# Patient Record
Sex: Female | Born: 1970
Health system: Southern US, Community
[De-identification: ages and names within clinical notes are randomized; demographics above are authoritative.]

## PROBLEM LIST (undated history)

## (undated) DIAGNOSIS — K219 Gastro-esophageal reflux disease without esophagitis: Secondary | ICD-10-CM

## (undated) DIAGNOSIS — R102 Pelvic and perineal pain: Secondary | ICD-10-CM

## (undated) DIAGNOSIS — R3915 Urgency of urination: Secondary | ICD-10-CM

## (undated) DIAGNOSIS — N3289 Other specified disorders of bladder: Secondary | ICD-10-CM

## (undated) DIAGNOSIS — F419 Anxiety disorder, unspecified: Secondary | ICD-10-CM

## (undated) DIAGNOSIS — N393 Stress incontinence (female) (male): Secondary | ICD-10-CM

## (undated) DIAGNOSIS — R350 Frequency of micturition: Secondary | ICD-10-CM

## (undated) DIAGNOSIS — N3010 Interstitial cystitis (chronic) without hematuria: Secondary | ICD-10-CM

---

## 1994-07-05 HISTORY — PX: TUBAL LIGATION: SHX77

## 1997-07-05 HISTORY — PX: HYSTEROSCOPY WITH D & C: SHX1775

## 1998-04-17 ENCOUNTER — Inpatient Hospital Stay (HOSPITAL_COMMUNITY): Admission: RE | Admit: 1998-04-17 | Discharge: 1998-04-18 | Payer: Self-pay | Admitting: Obstetrics and Gynecology

## 1998-07-05 HISTORY — PX: ABDOMINAL HYSTERECTOMY: SHX81

## 2000-05-19 ENCOUNTER — Other Ambulatory Visit: Admission: RE | Admit: 2000-05-19 | Discharge: 2000-05-19 | Payer: Self-pay | Admitting: Obstetrics and Gynecology

## 2000-07-15 ENCOUNTER — Ambulatory Visit (HOSPITAL_COMMUNITY): Admission: RE | Admit: 2000-07-15 | Discharge: 2000-07-15 | Payer: Self-pay | Admitting: Obstetrics and Gynecology

## 2004-07-09 ENCOUNTER — Emergency Department (HOSPITAL_COMMUNITY): Admission: EM | Admit: 2004-07-09 | Discharge: 2004-07-09 | Payer: Self-pay | Admitting: Emergency Medicine

## 2005-03-05 ENCOUNTER — Emergency Department (HOSPITAL_COMMUNITY): Admission: EM | Admit: 2005-03-05 | Discharge: 2005-03-05 | Payer: Self-pay | Admitting: Emergency Medicine

## 2010-12-07 ENCOUNTER — Emergency Department (HOSPITAL_COMMUNITY): Admission: EM | Admit: 2010-12-07 | Payer: Self-pay | Source: Home / Self Care

## 2011-01-14 ENCOUNTER — Emergency Department (HOSPITAL_COMMUNITY): Payer: No Typology Code available for payment source

## 2011-01-14 ENCOUNTER — Emergency Department (HOSPITAL_COMMUNITY)
Admission: EM | Admit: 2011-01-14 | Discharge: 2011-01-14 | Disposition: A | Discharge status: Home / Self Care | Payer: No Typology Code available for payment source | Attending: Emergency Medicine | Admitting: Emergency Medicine

## 2011-01-14 DIAGNOSIS — S139XXA Sprain of joints and ligaments of unspecified parts of neck, initial encounter: Secondary | ICD-10-CM | POA: Insufficient documentation

## 2011-01-14 DIAGNOSIS — M542 Cervicalgia: Secondary | ICD-10-CM | POA: Insufficient documentation

## 2011-02-03 ENCOUNTER — Other Ambulatory Visit: Payer: Self-pay | Admitting: Family Medicine

## 2011-02-03 DIAGNOSIS — R51 Headache: Secondary | ICD-10-CM

## 2011-02-04 ENCOUNTER — Ambulatory Visit
Admission: RE | Admit: 2011-02-04 | Discharge: 2011-02-04 | Disposition: A | Discharge status: Home / Self Care | Payer: No Typology Code available for payment source | Source: Ambulatory Visit | Attending: Family Medicine | Admitting: Family Medicine

## 2011-02-04 DIAGNOSIS — R51 Headache: Secondary | ICD-10-CM

## 2011-02-05 ENCOUNTER — Other Ambulatory Visit: Payer: No Typology Code available for payment source

## 2011-05-03 ENCOUNTER — Ambulatory Visit: Payer: No Typology Code available for payment source | Attending: Sports Medicine | Admitting: Physical Therapy

## 2011-05-03 DIAGNOSIS — M2569 Stiffness of other specified joint, not elsewhere classified: Secondary | ICD-10-CM | POA: Insufficient documentation

## 2011-05-03 DIAGNOSIS — M542 Cervicalgia: Secondary | ICD-10-CM | POA: Insufficient documentation

## 2011-05-03 DIAGNOSIS — IMO0001 Reserved for inherently not codable concepts without codable children: Secondary | ICD-10-CM | POA: Insufficient documentation

## 2011-05-06 ENCOUNTER — Ambulatory Visit: Payer: No Typology Code available for payment source | Attending: Sports Medicine | Admitting: Physical Therapy

## 2011-05-06 DIAGNOSIS — IMO0001 Reserved for inherently not codable concepts without codable children: Secondary | ICD-10-CM | POA: Insufficient documentation

## 2011-05-06 DIAGNOSIS — M2569 Stiffness of other specified joint, not elsewhere classified: Secondary | ICD-10-CM | POA: Insufficient documentation

## 2011-05-06 DIAGNOSIS — M542 Cervicalgia: Secondary | ICD-10-CM | POA: Insufficient documentation

## 2011-05-11 ENCOUNTER — Ambulatory Visit: Payer: No Typology Code available for payment source | Admitting: Physical Therapy

## 2011-05-13 ENCOUNTER — Ambulatory Visit: Payer: No Typology Code available for payment source | Admitting: Physical Therapy

## 2011-05-19 ENCOUNTER — Ambulatory Visit: Payer: No Typology Code available for payment source | Admitting: Rehabilitation

## 2011-05-20 ENCOUNTER — Ambulatory Visit: Payer: No Typology Code available for payment source | Admitting: Physical Therapy

## 2011-10-23 ENCOUNTER — Emergency Department (HOSPITAL_COMMUNITY)
Admission: EM | Admit: 2011-10-23 | Discharge: 2011-10-23 | Disposition: A | Discharge status: Home / Self Care | Payer: Self-pay | Attending: Emergency Medicine | Admitting: Emergency Medicine

## 2011-10-23 ENCOUNTER — Encounter (HOSPITAL_COMMUNITY): Payer: Self-pay | Admitting: *Deleted

## 2011-10-23 DIAGNOSIS — R220 Localized swelling, mass and lump, head: Secondary | ICD-10-CM | POA: Insufficient documentation

## 2011-10-23 DIAGNOSIS — J029 Acute pharyngitis, unspecified: Secondary | ICD-10-CM | POA: Insufficient documentation

## 2011-10-23 DIAGNOSIS — R05 Cough: Secondary | ICD-10-CM | POA: Insufficient documentation

## 2011-10-23 DIAGNOSIS — IMO0001 Reserved for inherently not codable concepts without codable children: Secondary | ICD-10-CM | POA: Insufficient documentation

## 2011-10-23 DIAGNOSIS — R112 Nausea with vomiting, unspecified: Secondary | ICD-10-CM | POA: Insufficient documentation

## 2011-10-23 DIAGNOSIS — R059 Cough, unspecified: Secondary | ICD-10-CM | POA: Insufficient documentation

## 2011-10-23 LAB — RAPID STREP SCREEN (MED CTR MEBANE ONLY): Streptococcus, Group A Screen (Direct): NEGATIVE

## 2011-10-23 MED ORDER — IBUPROFEN 800 MG PO TABS: 800.0000 mg | ORAL_TABLET | Freq: Three times a day (TID) | ORAL | Status: DC | PRN

## 2011-10-23 MED ORDER — GUAIFENESIN ER 1200 MG PO TB12: 1.0000 | ORAL_TABLET | Freq: Two times a day (BID) | ORAL | Status: DC

## 2011-10-23 MED ORDER — ACETAMINOPHEN-CODEINE 120-12 MG/5ML PO SOLN: 10.0000 mL | ORAL | Status: AC | PRN

## 2011-10-23 MED ORDER — KETOROLAC TROMETHAMINE 60 MG/2ML IM SOLN
60.0000 mg | Freq: Once | INTRAMUSCULAR | Status: AC
  Administered 2011-10-23: 60 mg via INTRAMUSCULAR
  Filled 2011-10-23: qty 2

## 2011-10-23 MED ORDER — IBUPROFEN 800 MG PO TABS: 800.0000 mg | ORAL_TABLET | Freq: Three times a day (TID) | ORAL | Status: DC

## 2011-10-23 MED ORDER — PREDNISONE 50 MG PO TABS: 50.0000 mg | ORAL_TABLET | Freq: Every day | ORAL | Status: DC

## 2011-10-23 MED ORDER — IBUPROFEN 800 MG PO TABS: 800.0000 mg | ORAL_TABLET | Freq: Three times a day (TID) | ORAL | Status: AC

## 2011-10-23 NOTE — Discharge Instructions (Signed)
Your strep test was negative. This will be confirmed by a culture. WE will call you if there is any positive test result. Return here as needed. Increase your fluids as well.

## 2011-10-23 NOTE — ED Provider Notes (Signed)
History     CSN: [id number]  Arrival date & time 10/23/11  0757   First MD Initiated Contact with Patient 10/23/11 [phone]      Chief Complaint  Patient presents with  . Generalized Body Aches  . Sore Throat  . Nausea  . Emesis    (Consider location/radiation/quality/duration/timing/severity/associated sxs/prior treatment) HPI Patient since department with sore throat began 2 days ago.  She, states she is also having body aches, and some mild cough.  Patient denies chest pain, shortness of breath, abdominal pain, weakness, headache or fever.  Patient, states she has never show much except for some NyQuil.  She states she had minimal relief from this.  Patient states that she feels like her throat pain may be from strep throat, as she has had this in the past. History reviewed. No pertinent past medical history.  Past Surgical History  Procedure Date  . Abdominal hysterectomy     History reviewed. No pertinent family history.  History  Substance Use Topics  . Smoking status: Never Smoker   . Smokeless tobacco: Never Used  . Alcohol Use: No    OB History    Grav Para Term Preterm Abortions TAB SAB Ect Mult Living                  Review of Systems All pertinent positives and negatives reviewed in the history of present illness  Allergies  Review of patient's allergies indicates no known allergies.  Home Medications   Current Outpatient Rx  Name Route Sig Dispense Refill  . CYCLOBENZAPRINE HCL 5 MG PO TABS Oral Take 5 mg by mouth 3 (three) times daily as needed. For muscle spasm    . PSEUDOEPH-DOXYLAMINE-DM-APAP 60-7.12-01-998 MG/30ML PO LIQD Oral Take 15 mLs by mouth 2 (two) times daily.      BP 113/79  Pulse 99  Temp(Src) 99 F (37.2 C) (Oral)  Resp 18  Ht 5\' 7"  (1.702 m)  Wt 200 lb (90.719 kg)  BMI 31.32 kg/m2  SpO2 98%  Physical Exam  Nursing note and vitals reviewed. Constitutional: She is oriented to person, place, and time. She appears  well-developed and well-nourished. No distress.  HENT:  Head: Normocephalic and atraumatic. No trismus in the jaw.  Mouth/Throat: Uvula is midline and mucous membranes are normal. Mucous membranes are not pale, not dry and not cyanotic. Uvula swelling present. Posterior oropharyngeal edema and posterior oropharyngeal erythema present. No oropharyngeal exudate or tonsillar abscesses.  Eyes: Pupils are equal, round, and reactive to light. Right eye exhibits no discharge. Left eye exhibits no discharge.  Neck: Normal range of motion. Neck supple.  Cardiovascular: Normal rate, regular rhythm and normal heart sounds.   Pulmonary/Chest: Effort normal and breath sounds normal. She has no wheezes.  Neurological: She is alert and oriented to person, place, and time. Coordination normal.  Skin: Skin is warm and dry. No rash noted.    ED Course  Procedures (including critical care time)  Return here as needed. She is advised to increase her fluid intake. Ordered a strep culture for completeness sake to confirm the negative rapid strep. Told to take tylenol as needed. Patient is also advised to recheck with her doctor or an urgent care as needed. Patient has no difficulty speaking, swallowing, breathing or signs of abscess on exam.   MDM     See above comments.     Carlyle Dolly, PA-C 10/23/11 1001  Carlyle Dolly, PA-C 10/23/11 1004

## 2011-10-23 NOTE — ED Notes (Addendum)
Pt from home with reports of generalized body aches and sore throat that started on Thursday with worsening symptoms since. Pt also endorses N/V, last emesis was 2 pm yesterday.

## 2011-10-24 LAB — STREP A DNA PROBE
Group A Strep Probe: NEGATIVE
Special Requests: NORMAL

## 2011-10-25 ENCOUNTER — Ambulatory Visit: Payer: Self-pay | Admitting: Internal Medicine

## 2011-10-25 ENCOUNTER — Encounter: Payer: Self-pay | Admitting: Internal Medicine

## 2011-10-25 VITALS — BP 117/81 | HR 99 | Temp 99.0°F | Resp 16 | Ht 67.0 in | Wt 205.4 lb

## 2011-10-25 DIAGNOSIS — J019 Acute sinusitis, unspecified: Secondary | ICD-10-CM

## 2011-10-25 MED ORDER — AMOXICILLIN 500 MG PO CAPS: 1000.0000 mg | ORAL_CAPSULE | Freq: Three times a day (TID) | ORAL | Status: AC

## 2011-10-25 NOTE — Progress Notes (Signed)
  Subjective:    Patient ID: Maria Blackburn, female    DOB: January 03, 1971, 41 y.o.   MRN: [id number]  HPIpostER visit on Saturday  Continues with sore throat but now has marked nasal congestion with postnasal drainage and nocturnal cough Continues with low-grade fever   Review of Systems     Objective:   Physical Exam Right TM red and bulging Nares with purulent discharge Throat red with inspissated mucus No a.c. Nodes Chest clear       Assessment & Plan:  Problem #1 sinusitis with early otitis media on the right Amoxicillin 1 g twice a day for 10 days 12 hour Sudafed

## 2011-10-25 NOTE — ED Provider Notes (Signed)
Medical screening examination/treatment/procedure(s) were performed by non-physician practitioner and as supervising physician I was immediately available for consultation/collaboration.    Kang Ishida L Jadasia Haws, MD 10/25/11 1104 

## 2011-11-02 ENCOUNTER — Ambulatory Visit: Payer: Self-pay | Admitting: Family Medicine

## 2011-11-02 ENCOUNTER — Telehealth: Payer: Self-pay

## 2011-11-02 VITALS — BP 136/87 | HR 85 | Temp 98.2°F | Resp 16 | Ht 68.0 in | Wt 204.0 lb

## 2011-11-02 DIAGNOSIS — J019 Acute sinusitis, unspecified: Secondary | ICD-10-CM

## 2011-11-02 DIAGNOSIS — R059 Cough, unspecified: Secondary | ICD-10-CM

## 2011-11-02 DIAGNOSIS — R05 Cough: Secondary | ICD-10-CM

## 2011-11-02 MED ORDER — AZITHROMYCIN 250 MG PO TABS: ORAL_TABLET | ORAL | Status: AC

## 2011-11-02 MED ORDER — PREDNISONE 20 MG PO TABS: ORAL_TABLET | ORAL | Status: AC

## 2011-11-02 NOTE — Telephone Encounter (Signed)
PT STATES SHE HAS FINISHED ALL HER ANTIBIOTIC AND DOESN'T FEEL ANY BETTER ASKS WHAT TO DO - PT IS NO INSURANCE AND CANNOT AFFORD ANOTHER OV RIGHT NOW  BEST: [phone] BF

## 2011-11-02 NOTE — Progress Notes (Signed)
  Subjective:    Patient ID: Maria Blackburn, female    DOB: 12-06-1970, 41 y.o.   MRN: [id number]  HPI 41 yo female here with cough, headache, right earache.  Seen in ED 4/20.  Seen here 4/22 and diagnosed with ear infection/sinusitis and given amoxicillin.  Has finished amox.  Throat a little better.  Ear still hurts (right).  Still with sinus pain and pressure, cough, runny nose, and hoarseness.  Fever gone.     Review of Systems Negative except as per HPI     Objective:   Physical Exam  Constitutional: She appears well-developed. No distress.  HENT:  Right Ear: Tympanic membrane, external ear and ear canal normal. Tympanic membrane is not injected, not scarred, not perforated, not erythematous, not retracted and not bulging.  Left Ear: Tympanic membrane, external ear and ear canal normal. Tympanic membrane is not injected, not scarred, not perforated, not erythematous, not retracted and not bulging.  Nose: Mucosal edema present. No rhinorrhea. Right sinus exhibits maxillary sinus tenderness and frontal sinus tenderness. Left sinus exhibits no maxillary sinus tenderness and no frontal sinus tenderness.  Mouth/Throat: Uvula is midline, oropharynx is clear and moist and mucous membranes are normal. No oropharyngeal exudate or tonsillar abscesses.  Cardiovascular: Normal rate, regular rhythm, normal heart sounds and intact distal pulses.   No murmur heard. Pulmonary/Chest: Effort normal and breath sounds normal. No respiratory distress. She has no wheezes. She has no rales.  Lymphadenopathy:       Head (right side): No submandibular and no preauricular adenopathy present.       Head (left side): No submandibular and no preauricular adenopathy present.       Right cervical: No superficial cervical and no posterior cervical adenopathy present.      Left cervical: No superficial cervical and no posterior cervical adenopathy present.       Right: No supraclavicular adenopathy present.   Left: No supraclavicular adenopathy present.  Skin: Skin is warm and dry.          Assessment & Plan:  Cough, sinusitis - zpak, pred taper OM - resolved.

## 2011-11-02 NOTE — Telephone Encounter (Signed)
Patient came into clinic today.

## 2011-11-02 NOTE — Telephone Encounter (Signed)
LMOM to call back

## 2012-08-04 ENCOUNTER — Ambulatory Visit: Payer: Self-pay | Admitting: Family Medicine

## 2012-08-04 VITALS — BP 143/86 | HR 78 | Temp 98.2°F | Resp 18 | Ht 67.5 in | Wt 211.4 lb

## 2012-08-04 DIAGNOSIS — R21 Rash and other nonspecific skin eruption: Secondary | ICD-10-CM

## 2012-08-04 DIAGNOSIS — B359 Dermatophytosis, unspecified: Secondary | ICD-10-CM

## 2012-08-04 MED ORDER — TRIAMCINOLONE ACETONIDE 0.1 % EX CREA: TOPICAL_CREAM | Freq: Two times a day (BID) | CUTANEOUS | Status: DC

## 2012-08-04 MED ORDER — TERBINAFINE HCL 250 MG PO TABS: 250.0000 mg | ORAL_TABLET | Freq: Every day | ORAL | Status: DC

## 2012-08-04 NOTE — Patient Instructions (Addendum)
It looks like you have a severe yeast infection under your breasts.  We are going to treat you with terbinafine daily for 2 weeks to try and clear this up.  In addition try to keep the area very dry.  After you bathe pat it dry with a towel, and dry it with a hair dryer on cool setting.    Your other rash (on your arms and legs) is likely eczema made worse by dryness and cold weather.  Try the triamcinolone cream once or twice a day, and moisturize with a thick moisturizing cream (not a lotion).    It would be wise to have your skin mass removed or at least biopsied.  Also, I recommend that you have a mammogram.  Look into possible discount mammogram programs at the [facility].    Let me know if you are not better in the next few days!

## 2012-08-04 NOTE — Progress Notes (Signed)
[facility] [address] [place] [address] [phone]  Date:  08/04/2012   Name:  Maria Blackburn   DOB:  08/06/1970   MRN:  [id number]  PCP:  No primary provider on file.    Chief Complaint: Rash   History of Present Illness:  Maria Blackburn is a 42 y.o. very pleasant female patient who presents with the following:  She is here today with a skin "infection."  She noted redness under her right breast in November.  It is very itchy and irritated, and has spread to her left side as well.  She also has a diffuse skin rash over her upper arms and thighs- this has been present for a few weeks. Finally, she notes a "growth" under her right breast which has been present for 15 years or more.  It has been growing slowly.  She was told it was an "after birth mark."   She is generally healhty as far as she knows, but admits that finances have made it hard for her to seek medical care.  She has not yet had a mammogram.  She is not taking any current medications  Otherwise she feels well, no fever, chills, or other systemic symptoms. No GI symptoms.  No history of DM. She is NOT fasting today  S/p hysterectomy  There is no problem list on file for this patient.   No past medical history on file.  Past Surgical History  Procedure Date  . Abdominal hysterectomy     History  Substance Use Topics  . Smoking status: Never Smoker   . Smokeless tobacco: Never Used  . Alcohol Use: No    No family history on file.  No Known Allergies  Medication list has been reviewed and updated.  Current Outpatient Prescriptions on File Prior to Visit  Medication Sig Dispense Refill  . cyclobenzaprine (FLEXERIL) 5 MG tablet Take 5 mg by mouth 3 (three) times daily as needed. For muscle spasm      . Guaifenesin 1200 MG TB12 Take 1 tablet (1,200 mg total) by mouth 2 (two) times daily.  20 each  0  . Pseudoeph-Doxylamine-DM-APAP (NYQUIL) 60-7.12-01-998 MG/30ML LIQD Take 15 mLs  by mouth 2 (two) times daily.        Review of Systems:  As per HPI- otherwise negative.   Physical Examination: Filed Vitals:   08/04/12 1111  BP: 143/86  Pulse: 78  Temp: 98.2 F (36.8 C)  Resp: 18   Filed Vitals:   08/04/12 1111  Height: 5' 7.5" (1.715 m)  Weight: 211 lb 6.4 oz (95.89 kg)   Body mass index is 32.62 kg/(m^2). Ideal Body Weight: Weight in (lb) to have BMI = 25: 161.7   GEN: WDWN, NAD, Non-toxic, A & O x 3, overweight HEENT: Atraumatic, Normocephalic. Neck supple. No masses, No LAD. No oral lesions Ears and Nose: No external deformity. CV: RRR, No M/G/R. No JVD. No thrill. No extra heart sounds. PULM: CTA B, no wheezes, crackles, rhonchi. No retractions. No resp. distress. No accessory muscle use. ABD: S, NT, ND EXTR: No c/c/e NEURO Normal gait.  PSYCH: Normally interactive. Conversant. Not depressed or anxious appearing.  Calm demeanor.  Breast: exam normal- no masses or lesions Area under breasts abnormal- under her right breast is a large, linear, ?varrucal mass that is somewhat friable and irritated.  She also has marked apparent fungal infection under both breasts with redness, warmth and weeping. The are is slightly  tender to cough Over her upper arms, buttocks and thighs is a discrete papular rash with multiple dry, scaly, red areas less than pencil eraser sized in diameter.  Appears c/w ezcema  Results for orders placed in visit on 08/04/12  POCT SKIN KOH      Component Value Range   Skin KOH, POC Positive    GLUCOSE, POCT (MANUAL RESULT ENTRY)      Component Value Range   POC Glucose 121 (*) 70 - 99 mg/dl    Assessment and Plan: 1. Rash, skin  POCT Skin KOH, triamcinolone cream (KENALOG) 0.1 %  2. Tinea  terbinafine (LAMISIL) 250 MG tablet, POCT glucose (manual entry)   Multiple skin findings as above.  Treat for severe tinea with oral terbinafine for 2 weeks- she declined baseline labs today.  Also counseled in skin care, keeping the area  dry and cool.    See patient instructions for more details.   Gave resources for lower- cost mammogram services. Let me know that we can also biopsy her skin mass once the yeast infection has calmed down.   Meds ordered this encounter  Medications  . terbinafine (LAMISIL) 250 MG tablet    Sig: Take 1 tablet (250 mg total) by mouth daily.    Dispense:  14 tablet    Refill:  0  . triamcinolone cream (KENALOG) 0.1 %    Sig: Apply topically 2 (two) times daily.    Dispense:  454 g    Refill:  0     Narciso Stoutenburg, MD

## 2012-12-07 ENCOUNTER — Ambulatory Visit: Payer: Self-pay | Admitting: Family Medicine

## 2012-12-07 VITALS — BP 136/88 | HR 81 | Temp 97.8°F | Resp 16 | Ht 67.5 in | Wt 213.6 lb

## 2012-12-07 DIAGNOSIS — R519 Headache, unspecified: Secondary | ICD-10-CM

## 2012-12-07 DIAGNOSIS — R51 Headache: Secondary | ICD-10-CM

## 2012-12-07 LAB — POCT SEDIMENTATION RATE: POCT SED RATE: 14 mm/hr (ref 0–22)

## 2012-12-07 MED ORDER — ACYCLOVIR 800 MG PO TABS: 800.0000 mg | ORAL_TABLET | Freq: Every day | ORAL | Status: AC

## 2012-12-07 MED ORDER — GABAPENTIN 300 MG PO CAPS: 300.0000 mg | ORAL_CAPSULE | Freq: Three times a day (TID) | ORAL | Status: DC

## 2012-12-07 MED ORDER — TRAMADOL HCL 50 MG PO TABS: 50.0000 mg | ORAL_TABLET | Freq: Three times a day (TID) | ORAL | Status: DC | PRN

## 2012-12-07 NOTE — Progress Notes (Signed)
I examined the patient and agree with the assessment and plan.

## 2012-12-07 NOTE — Progress Notes (Signed)
Subjective:    Patient ID: Maria Blackburn, female    DOB: 1970-07-19, 42 y.o.   MRN: [id number]  HPI  Presents with scalp pain, ear pain, jaw pain x 3 days, ear pain is what brought her in today. Before symptoms started, pt had 2 days of headache. Pain located on top of head on the left side down through the lower left jaw, including left ear, in a linear pattern. Reports pain along gums of upper and lower jaw on left side. No pain on right side of face/head/ear. Pain has been constant since onset. States she has pain when swallowing, brushing her hair, lays on the left side of her head, on even if the wind blows in her ear. Patient tried [facility] Powder, Benadryl, Advil, an OTC ear drops (name unknown), and sweet oils - with no relief. Denies fever/chills, discharge from ear, pain around temporal artery, vision changes. Pt does not feel sick like a cold but she is concerned that this is a sinus infection though it does not feel like the ones she has had in the past. Of interest, patient reports a linear rash under right breast 2 months ago - etiology was uncertain - she did not have this type of   Review of Systems Denies dizziness, SOB, chest pain, abdominal pain, nausea/vomiting/diarrhea, urinary symptoms, swelling in legs, numbness/tingling in hands/feet.     Objective:   Physical Exam  General: WDWN female in no acute distress but apparent she feels unwell  HEENT:   Head:normocephalic, atraumatic, scalp is normal in color shows no rashes lesions or edema, left side is severely tender to palpation - esp in the V3 or C3 dermatomes distribution (patient has pain with her hair being moved) - no TTP, erythema or palpable vein over L temporal artery  Eyes: PERLA, conjunctiva clear  Ears: TMs are clear with visible bony landmarks, left ear severely tender to palpation and otoscope causes pain - L ear canal has oily substance - no obvious vesicles or lesions in L ear canal  Nose: turbinates are visible  with no apparent discharge, no vesicles or lesions on the nose or any other part of the face  Mouth/Throat: uvula is midline, posterior palate and tonsils are pink with no visible exudate, moist mucous membranes, gums show no erythema or edema  Neck: neck supple, tonsillar and AC lymphadenopathy on left side of neck - tender to palpation  Resp: clear to auscultation anterior and posterior bilaterally, no rales/rhonchi/wheezes  Cardiac: RRR, S1S2 appreciated, no murmurs/rubs/gallops Neuro: alert & oriented x3, cranial nerves II-XII intact, cerebellar intact, some sensation deficits along V3/C3 dermatome Skin: no rash, lesions.   Results for orders placed in visit on 12/07/12  POCT SEDIMENTATION RATE      Result Value Range   POCT SED RATE 14  0 - 22 mm/hr       Assessment & Plan:  Scalp pain - Plan: POCT SEDIMENTATION RATE, C-reactive protein, acyclovir (ZOVIRAX) 800 MG tablet, traMADol (ULTRAM) 50 MG tablet, gabapentin (NEURONTIN) 300 MG capsule  Suspect patient has Varicella Zoster. Perhaps an early presentation before eruption of rash or a presentation without rash. Also possible, but less likely, is postherpetic neuralgia and the pt had the rash last week.  Temportal arteritis is less likely due to normal sed rate.  Acyclovir for anti-viral coverage. Neurontin for nerve pain. Ultram for pain.  Patient to return if rash emerges or call and we will determine the location of the rash to see if patient  needs to see ophthalmology or needs an OV. Follow up in 7-10 days if no improvement or sooner with worsening of symptoms.   D/w Dr Patsy Lager.

## 2012-12-07 NOTE — Patient Instructions (Addendum)
Take acyclovir for 7 days Take gabapentin - start with 1 pill at night - if it does not make you sleepy take 1 pill 2x/day and then increase to 1 pill 3x/day if you can tolerate it Take ultram as needed for pain   Shingles Shingles (herpes zoster) is an infection that is caused by the same virus that causes chickenpox (varicella). The infection causes a painful skin rash and fluid-filled blisters, which eventually break open, crust over, and heal. It may occur in any area of the body, but it usually affects only one side of the body or face. The pain of shingles usually lasts about 1 month. However, some people with shingles may develop long-term (chronic) pain in the affected area of the body. Shingles often occurs many years after the person had chickenpox. It is more common:  In people older than 50 years.  In people with weakened immune systems, such as those with HIV, AIDS, or cancer.  In people taking medicines that weaken the immune system, such as transplant medicines.  In people under great stress. CAUSES  Shingles is caused by the varicella zoster virus (VZV), which also causes chickenpox. After a person is infected with the virus, it can remain in the person's body for years in an inactive state (dormant). To cause shingles, the virus reactivates and breaks out as an infection in a nerve root. The virus can be spread from person to person (contagious) through contact with open blisters of the shingles rash. It will only spread to people who have not had chickenpox. When these people are exposed to the virus, they may develop chickenpox. They will not develop shingles. Once the blisters scab over, the person is no longer contagious and cannot spread the virus to others. SYMPTOMS  Shingles shows up in stages. The initial symptoms may be pain, itching, and tingling in an area of the skin. This pain is usually described as burning, stabbing, or throbbing.In a few days or weeks, a painful  red rash will appear in the area where the pain, itching, and tingling were felt. The rash is usually on one side of the body in a band or belt-like pattern. Then, the rash usually turns into fluid-filled blisters. They will scab over and dry up in approximately 2 3 weeks. Flu-like symptoms may also occur with the initial symptoms, the rash, or the blisters. These may include:  Fever.  Chills.  Headache.  Upset stomach. DIAGNOSIS  Your caregiver will perform a skin exam to diagnose shingles. Skin scrapings or fluid samples may also be taken from the blisters. This sample will be examined under a microscope or sent to a lab for further testing. TREATMENT  There is no specific cure for shingles. Your caregiver will likely prescribe medicines to help you manage the pain, recover faster, and avoid long-term problems. This may include antiviral drugs, anti-inflammatory drugs, and pain medicines. HOME CARE INSTRUCTIONS   Take a cool bath or apply cool compresses to the area of the rash or blisters as directed. This may help with the pain and itching.   Only take over-the-counter or prescription medicines as directed by your caregiver.   Rest as directed by your caregiver.  Keep your rash and blisters clean with mild soap and cool water or as directed by your caregiver.  Do not pick your blisters or scratch your rash. Apply an anti-itch cream or numbing creams to the affected area as directed by your caregiver.  Keep your shingles rash covered  with a loose bandage (dressing).  Avoid skin contact with:  Babies.   Pregnant women.   Children with eczema.   Elderly people with transplants.   People with chronic illnesses, such as leukemia or AIDS.   Wear loose-fitting clothing to help ease the pain of material rubbing against the rash.  Keep all follow-up appointments with your caregiver.If the area involved is on your face, you may receive a referral for follow-up to a  specialist, such as an eye doctor (ophthalmologist) or an ear, nose, and throat (ENT) doctor. Keeping all follow-up appointments will help you avoid eye complications, chronic pain, or disability.  SEEK IMMEDIATE MEDICAL CARE IF:   You have facial pain, pain around the eye area, or loss of feeling on one side of your face.  You have ear pain or ringing in your ear.  You have loss of taste.  Your pain is not relieved with prescribed medicines.   Your redness or swelling spreads.   You have more pain and swelling.  Your condition is worsening or has changed.   You have a feveror persistent symptoms for more than 2 3 days.  You have a fever and your symptoms suddenly get worse. MAKE SURE YOU:  Understand these instructions.  Will watch your condition.  Will get help right away if you are not doing well or get worse. Document Released: 06/21/2005 Document Revised: 03/15/2012 Document Reviewed: 02/03/2012 [facility] Patient Information 2014 [place], [place].

## 2013-05-10 ENCOUNTER — Other Ambulatory Visit: Payer: Self-pay

## 2016-03-12 DIAGNOSIS — F419 Anxiety disorder, unspecified: Secondary | ICD-10-CM | POA: Diagnosis not present

## 2016-03-12 DIAGNOSIS — G4700 Insomnia, unspecified: Secondary | ICD-10-CM | POA: Diagnosis not present

## 2016-03-13 DIAGNOSIS — Z0100 Encounter for examination of eyes and vision without abnormal findings: Secondary | ICD-10-CM | POA: Diagnosis not present

## 2016-03-20 ENCOUNTER — Ambulatory Visit (INDEPENDENT_AMBULATORY_CARE_PROVIDER_SITE_OTHER): Payer: BLUE CROSS/BLUE SHIELD | Admitting: Family Medicine

## 2016-03-20 VITALS — BP 124/80 | HR 88 | Temp 98.6°F | Resp 16 | Ht 66.5 in | Wt 200.0 lb

## 2016-03-20 DIAGNOSIS — Z130 Encounter for screening for diseases of the blood and blood-forming organs and certain disorders involving the immune mechanism: Secondary | ICD-10-CM | POA: Diagnosis not present

## 2016-03-20 DIAGNOSIS — N951 Menopausal and female climacteric states: Secondary | ICD-10-CM | POA: Diagnosis not present

## 2016-03-20 DIAGNOSIS — Z136 Encounter for screening for cardiovascular disorders: Secondary | ICD-10-CM | POA: Diagnosis not present

## 2016-03-20 DIAGNOSIS — E559 Vitamin D deficiency, unspecified: Secondary | ICD-10-CM

## 2016-03-20 DIAGNOSIS — Z124 Encounter for screening for malignant neoplasm of cervix: Secondary | ICD-10-CM

## 2016-03-20 DIAGNOSIS — N3010 Interstitial cystitis (chronic) without hematuria: Secondary | ICD-10-CM

## 2016-03-20 DIAGNOSIS — T733XXA Exhaustion due to excessive exertion, initial encounter: Secondary | ICD-10-CM | POA: Diagnosis not present

## 2016-03-20 DIAGNOSIS — Z1231 Encounter for screening mammogram for malignant neoplasm of breast: Secondary | ICD-10-CM

## 2016-03-20 DIAGNOSIS — F4389 Other reactions to severe stress: Secondary | ICD-10-CM

## 2016-03-20 DIAGNOSIS — Z1383 Encounter for screening for respiratory disorder NEC: Secondary | ICD-10-CM

## 2016-03-20 DIAGNOSIS — Z1329 Encounter for screening for other suspected endocrine disorder: Secondary | ICD-10-CM | POA: Diagnosis not present

## 2016-03-20 DIAGNOSIS — Z1389 Encounter for screening for other disorder: Secondary | ICD-10-CM | POA: Diagnosis not present

## 2016-03-20 DIAGNOSIS — B3731 Acute candidiasis of vulva and vagina: Secondary | ICD-10-CM

## 2016-03-20 DIAGNOSIS — Z0000 Encounter for general adult medical examination without abnormal findings: Secondary | ICD-10-CM | POA: Diagnosis not present

## 2016-03-20 DIAGNOSIS — B373 Candidiasis of vulva and vagina: Secondary | ICD-10-CM

## 2016-03-20 DIAGNOSIS — Z113 Encounter for screening for infections with a predominantly sexual mode of transmission: Secondary | ICD-10-CM

## 2016-03-20 DIAGNOSIS — F39 Unspecified mood [affective] disorder: Secondary | ICD-10-CM | POA: Diagnosis not present

## 2016-03-20 DIAGNOSIS — R457 State of emotional shock and stress, unspecified: Secondary | ICD-10-CM

## 2016-03-20 DIAGNOSIS — R5382 Chronic fatigue, unspecified: Secondary | ICD-10-CM | POA: Diagnosis not present

## 2016-03-20 LAB — POCT CBC
Granulocyte percent: 66.5 %G (ref 37–80)
HEMATOCRIT: 41.2 % (ref 37.7–47.9)
HEMOGLOBIN: 14.6 g/dL (ref 12.2–16.2)
Lymph, poc: 2.3 (ref 0.6–3.4)
MCH: 31 pg (ref 27–31.2)
MCHC: 35.4 g/dL (ref 31.8–35.4)
MCV: 87.6 fL (ref 80–97)
MID (cbc): 0.6 (ref 0–0.9)
MPV: 7 fL (ref 0–99.8)
POC GRANULOCYTE: 5.7 (ref 2–6.9)
POC LYMPH PERCENT: 26.5 %L (ref 10–50)
POC MID %: 7 % (ref 0–12)
Platelet Count, POC: 296 10*3/uL (ref 142–424)
RBC: 4.71 M/uL (ref 4.04–5.48)
RDW, POC: 13 %
WBC: 8.5 10*3/uL (ref 4.6–10.2)

## 2016-03-20 LAB — COMPREHENSIVE METABOLIC PANEL
ALBUMIN: 4.3 g/dL (ref 3.6–5.1)
ALT: 15 U/L (ref 6–29)
AST: 16 U/L (ref 10–35)
Alkaline Phosphatase: 45 U/L (ref 33–115)
BILIRUBIN TOTAL: 1.3 mg/dL — AB (ref 0.2–1.2)
BUN: 9 mg/dL (ref 7–25)
CALCIUM: 8.9 mg/dL (ref 8.6–10.2)
CHLORIDE: 104 mmol/L (ref 98–110)
CO2: 27 mmol/L (ref 20–31)
Creat: 0.75 mg/dL (ref 0.50–1.10)
GLUCOSE: 99 mg/dL (ref 65–99)
POTASSIUM: 3.9 mmol/L (ref 3.5–5.3)
Sodium: 139 mmol/L (ref 135–146)
Total Protein: 6.6 g/dL (ref 6.1–8.1)

## 2016-03-20 LAB — POCT WET + KOH PREP: Trich by wet prep: ABSENT

## 2016-03-20 LAB — POCT URINALYSIS DIP (MANUAL ENTRY)
BILIRUBIN UA: NEGATIVE
BILIRUBIN UA: NEGATIVE
GLUCOSE UA: NEGATIVE
Leukocytes, UA: NEGATIVE
NITRITE UA: NEGATIVE
RBC UA: NEGATIVE
Spec Grav, UA: 1.015
Urobilinogen, UA: 1
pH, UA: 7

## 2016-03-20 LAB — TSH: TSH: 0.96 mIU/L

## 2016-03-20 LAB — FSH/LH
FSH: 7.3 m[IU]/mL
LH: 17.1 m[IU]/mL

## 2016-03-20 LAB — LIPID PANEL
CHOL/HDL RATIO: 3.1 ratio (ref <=5.0)
CHOLESTEROL: 176 mg/dL (ref 125–200)
HDL: 57 mg/dL (ref >=46)
LDL CALC: 101 mg/dL (ref <130)
TRIGLYCERIDES: 88 mg/dL (ref <150)
VLDL: 18 mg/dL (ref <30)

## 2016-03-20 LAB — VITAMIN B12: VITAMIN B 12: 369 pg/mL (ref 200–1100)

## 2016-03-20 LAB — HIV ANTIBODY (ROUTINE TESTING W REFLEX): HIV 1&2 Ab, 4th Generation: NONREACTIVE

## 2016-03-20 MED ORDER — SERTRALINE HCL 25 MG PO TABS: 25.0000 mg | ORAL_TABLET | Freq: Every day | ORAL | 1 refills | Status: DC

## 2016-03-20 NOTE — Progress Notes (Signed)
Subjective:    Patient ID: Maria Blackburn, female    DOB: [birth year], 45 y.o.   MRN: [id number] By signing my name below, I, Javier Docker, attest that this documentation has been prepared under the direction and in the presence of Norberto Sorenson, MD. Electronically Signed: Javier Docker, ER Scribe. 03/20/2016. 9:23 AM.  Chief Complaint  Patient presents with  . Annual Exam    Depression     HPI HPI Comments: Maria Blackburn is a 45 y.o. female with a past hx of panic attacks who presents to [facility] complaining of emotional disturbance. She states she is helping her father has PTSD and she supports him, and her son has autism, bipolar disorder, violent behavior and other serious conditions and lives with her. He brother also died on 2024-10-08one year ago. She also started a full time job recently. She has taken klonopin without much relief. She does not take vitamins. Her last TDAP was in 2011.  She has a past hx of hysterectomy for benign reasons, primarily dysfunctional uterine bleeding. Her mother went through menopause in her early 22s. Her mother also had two MI, one at 49 and one at 46 y/o.   Past Medical History:  Diagnosis Date  . Allergy    No Known Allergies  No current outpatient prescriptions on file prior to visit.   No current facility-administered medications on file prior to visit.    Past Surgical History:  Procedure Laterality Date  . ABDOMINAL HYSTERECTOMY    . TUBAL LIGATION     Family History  Problem Relation Age of Onset  . Heart disease Mother   . Diabetes Son    Social History   Social History  . Marital status: Married    Spouse name: N/A  . Number of children: N/A  . Years of education: N/A   Social History Main Topics  . Smoking status: Never Smoker  . Smokeless tobacco: Never Used  . Alcohol use No  . Drug use: No  . Sexual activity: Not Asked   Other Topics Concern  . None   Social History Narrative  . None   Depression  screen [facility] 2/9 03/20/2016  Decreased Interest 0  Down, Depressed, Hopeless 0  PHQ - 2 Score 0    Review of Systems  Constitutional: Negative for chills and fever.  Genitourinary: Negative for vaginal pain.       Vaginal dryness.  Psychiatric/Behavioral: Positive for sleep disturbance. The patient is nervous/anxious.   All other systems reviewed and are negative.     Objective:   Physical Exam  Constitutional: She is oriented to person, place, and time. She appears well-developed and well-nourished. No distress.  HENT:  Head: Normocephalic and atraumatic.  Eyes: Pupils are equal, round, and reactive to light.  Neck: Neck supple.  Cardiovascular: Normal rate.   Pulmonary/Chest: Effort normal. No respiratory distress.  Abdominal:  She fibrocystic breast bilaterally. No axillary adenopathy.  Genitourinary:  Genitourinary Comments: Pelvic exam: Moderate thick white vaginal discharge. No cervix.  Musculoskeletal: Normal range of motion.  Neurological: She is alert and oriented to person, place, and time. Coordination normal.  Skin: Skin is warm and dry. She is not diaphoretic.  Psychiatric: She has a normal mood and affect. Her behavior is normal.  Nursing note and vitals reviewed.   BP 124/80 (BP Location: Right Arm, Patient Position: Sitting, Cuff Size: Normal)   Pulse 88   Temp 98.6 F (37 C) (Oral)  Resp 16   Ht 5' 6.5" (1.689 m)   Wt 200 lb (90.7 kg)   SpO2 96%   BMI 31.80 kg/m      Assessment & Plan:  Pt agreed to flu shot, but left before administered.  1. Annual physical exam   2. Routine screening for STI (sexually transmitted infection)   3. Encounter for screening mammogram for breast cancer   4. Screening for cardiovascular, respiratory, and genitourinary diseases   5. Screening for cervical cancer   6. Screening for deficiency anemia   7. Screening for thyroid disorder   8. Episodic mood disorder (HCC)   9. Caregiver stress syndrome   10. Interstitial  cystitis   11. Fatigue due to excessive exertion, initial encounter   12. Perimenopausal vasomotor symptoms   13. Vaginal candidiasis   14. Vitamin D deficiency    Recommend pt establish with therapist - her life has an incredible amount of stressors - she is the sole caregiver and monetary provider for many family members with virtually no social support.  Encouraged pt to take time for herself - to exercise, eat healthy, and importance of making sure she has emotional/mental restorative time if she wants to be able to keep going on her current pace/stress level. She needs to have a safe space and an impartial voice for her to be able to take it all on  - contact names/# given in AVS.  Orders Placed This Encounter  Procedures  . MM Digital Screening    Standing Status:   Future    Standing Expiration Date:   05/20/2017    Order Specific Question:   Reason for Exam (SYMPTOM  OR DIAGNOSIS REQUIRED)    Answer:   screening    Order Specific Question:   Is the patient pregnant?    Answer:   No    Order Specific Question:   Preferred imaging location?    Answer:   [facility]  . Comprehensive metabolic panel    Order Specific Question:   Has the patient fasted?    Answer:   Yes  . TSH  . Lipid panel    Order Specific Question:   Has the patient fasted?    Answer:   Yes  . HIV antibody  . Vitamin B12  . VITAMIN D 25 Hydroxy (Vit-D Deficiency, Fractures)  . FSH/LH  . POCT urinalysis dipstick  . POCT CBC  . POCT Wet + KOH Prep    Meds ordered this encounter  Medications  . hydrOXYzine (ATARAX/VISTARIL) 50 MG tablet    Sig: Take 50 mg by mouth every 6 (six) hours as needed.  . sertraline (ZOLOFT) 25 MG tablet    Sig: Take 1 tablet (25 mg total) by mouth daily.    Dispense:  30 tablet    Refill:  1  . fluconazole (DIFLUCAN) 150 MG tablet    Sig: Take 1 tablet (150 mg total) by mouth once. Repeat if needed after 3 days    Dispense:  2 tablet    Refill:  0  . Vitamin D,  Ergocalciferol, (DRISDOL) 50000 units CAPS capsule    Sig: Take 1 capsule (50,000 Units total) by mouth every 7 (seven) days.    Dispense:  24 capsule    Refill:  0    I personally performed the services described in this documentation, which was scribed in my presence. The recorded information has been reviewed and considered, and addended by me as needed.   Norberto Sorenson,  M.D.  [facility]  [place] [address] [place], [place] [address] [phone] phone [phone] fax  04/04/16 1:16 AM

## 2016-03-20 NOTE — Patient Instructions (Addendum)
IF you received an x-ray today, you will receive an invoice from [facility]. Please contact [facility] at [phone] with questions or concerns regarding your invoice.   IF you received labwork today, you will receive an invoice from [employer]. Please contact [facility] at [phone] with questions or concerns regarding your invoice.   Our billing staff will not be able to assist you with questions regarding bills from these companies.  You will be contacted with the lab results as soon as they are available. The fastest way to get your results is to activate your My Chart account. Instructions are located on the last page of this paperwork. If you have not heard from [place] regarding the results in 2 weeks, please contact this office.     We recommend that you schedule a mammogram for breast cancer screening. Typically, you do not need a referral to do this. Please contact a local imaging center to schedule your mammogram.  [facility] - [phone]  *ask for the Radiology Department [facility]) - [phone] or [phone]  [facility] - [phone] [facility] - [phone] [facility] - [phone]  *ask for the Radiology Department [facility] - [phone]  *ask for the Radiology Department [facility] - [phone]  *ask for the Mammography Department [facility] - [phone]   You can call the 24-hour [facility] HelpLine at [phone] or [phone] for immediate assistance. Among several different types of services, they offer an Intensive oupatient program for mood disorders - which is a group type setting Monday-Friday 9-noon.  You can schedule an assessment by calling the above numbers during which the costs for the program and insurance benefits will be reviewed.    No psychological  or psychiatric services take physician referrals - they always want the patient to call. Some excellent private psychiatrists for individual counseling are:  [facility] at [facility] [address] [place], [place] [address] Phone: [phone]  [facility]  [address], [place], [place] [address]  [phone]  [facility] [address] [place], [place], [place] [address]  Phone: [phone]   [facility] [address] [place], [place] [address]  Phone:([phone]  [facility] [address] [place], [place] Phone: [phone]   [facility]  [address] [place], [place] [address]  Phone:([phone]) [phone]   Charlies Silvers, MD, PA [address], [place], [place] [address] Phone: [phone]  You can always go to [facility] if suicidal or there are walk-in crisis services available at: [facility] [address], [place] [address] [phone]  Hour of operations: Monday-Friday, 8:30-5 p.m.  They take medicare/medicaid and the patient can contact [facility] referral department at [phone] or [email or web address] for more information.   Adjustment Disorder Adjustment disorder is an unusually severe reaction to a stressful life event, such as the loss of a job or physical illness. The event may be any stressful event other than the loss of a loved one. Adjustment disorder may affect your feelings, your thinking, how you act, or a combination of these. It may interfere with personal relationships or with the way you are at work, school, or home. People with this disorder are at risk for suicide and substance abuse. They may develop  a more serious mental disorder, such as major depressive disorder or post-traumatic stress disorder. SIGNS AND SYMPTOMS  Symptoms may  include:  Sadness, depressed mood, or crying spells.  Loss of enjoyment.  Change in appetite or weight.  Sense of loss or hopelessness.  Thoughts of suicide.  Anxiety, worry, or nervousness.  Trouble sleeping.  Avoiding family and friends.  Poor school performance.  Fighting or vandalism.  Reckless driving.  Skipping school.  Poor work International aid/development workerperformance.  Ignoring bills. Symptoms of adjustment disorder start within 3 months of the stressful life event. They do not last more than 6 months after the event has ended. DIAGNOSIS  To make a diagnosis, your health care provider will ask about what has happened in your life and how it has affected you. He or she may also ask about your medical history and use of medicines, alcohol, and other substances. Your health care provider may do a physical exam and order lab tests or other studies. You may be referred to a mental health specialist for evaluation. TREATMENT  Treatment options include:  Counseling or talk therapy. Talk therapy is usually provided by mental health specialists.  Medicine. Certain medicines may help with depression, anxiety, and sleep.  Support groups. Support groups offer emotional support, advice, and guidance. They are made up of people who have had similar experiences. HOME CARE INSTRUCTIONS  Keep all follow-up visits as directed by your health care provider. This is important.  Take medicines only as directed by your health care provider. SEEK MEDICAL CARE IF:  Your symptoms get worse.  SEEK IMMEDIATE MEDICAL CARE IF: You have serious thoughts about hurting yourself or someone else. MAKE SURE YOU:  Understand these instructions.  Will watch your condition.  Will get help right away if you are not doing well or get worse.   This information is not intended to replace advice given to you by your health care provider. Make sure you discuss any questions you have with your health care provider.    Document Released: 02/23/2006 Document Revised: 07/12/2014 Document Reviewed: 11/13/2013 Elsevier Interactive Patient Education [employer]2016 Elsevier Inc.

## 2016-03-21 LAB — VITAMIN D 25 HYDROXY (VIT D DEFICIENCY, FRACTURES): Vit D, 25-Hydroxy: 17 ng/mL — ABNORMAL LOW (ref 30–100)

## 2016-03-23 LAB — PAP IG W/ RFLX HPV ASCU

## 2016-03-29 MED ORDER — FLUCONAZOLE 150 MG PO TABS: 150.0000 mg | ORAL_TABLET | Freq: Once | ORAL | 0 refills | Status: AC

## 2016-03-29 MED ORDER — VITAMIN D (ERGOCALCIFEROL) 1.25 MG (50000 UNIT) PO CAPS
50000.0000 [IU] | ORAL_CAPSULE | ORAL | 0 refills | Status: DC
Start: 2016-03-29 — End: 2016-11-08

## 2016-04-07 ENCOUNTER — Other Ambulatory Visit: Payer: Self-pay | Admitting: Family Medicine

## 2016-04-07 DIAGNOSIS — Z1231 Encounter for screening mammogram for malignant neoplasm of breast: Secondary | ICD-10-CM

## 2016-05-01 DIAGNOSIS — J0100 Acute maxillary sinusitis, unspecified: Secondary | ICD-10-CM | POA: Diagnosis not present

## 2016-05-03 ENCOUNTER — Ambulatory Visit
Admission: RE | Admit: 2016-05-03 | Discharge: 2016-05-03 | Disposition: A | Discharge status: Home / Self Care | Payer: BLUE CROSS/BLUE SHIELD | Source: Ambulatory Visit | Attending: Family Medicine | Admitting: Family Medicine

## 2016-05-03 DIAGNOSIS — Z1231 Encounter for screening mammogram for malignant neoplasm of breast: Secondary | ICD-10-CM

## 2016-06-07 ENCOUNTER — Other Ambulatory Visit: Payer: Self-pay | Admitting: Family Medicine

## 2016-06-07 DIAGNOSIS — F322 Major depressive disorder, single episode, severe without psychotic features: Secondary | ICD-10-CM | POA: Diagnosis not present

## 2016-06-10 NOTE — Telephone Encounter (Signed)
Last ov last 03/2016

## 2016-06-11 NOTE — Telephone Encounter (Signed)
LMOM asking pt to call or send Mychart message to Dr Clelia CroftShaw to update her on how she is doing on current dose of zoloft and if any SEs?

## 2016-06-11 NOTE — Telephone Encounter (Signed)
Please ask how the sertraline is working.  Does she feel like she is at a good dose or need to go up at all? Any side effects?

## 2016-06-21 DIAGNOSIS — R252 Cramp and spasm: Secondary | ICD-10-CM | POA: Diagnosis not present

## 2016-06-21 DIAGNOSIS — E559 Vitamin D deficiency, unspecified: Secondary | ICD-10-CM | POA: Diagnosis not present

## 2016-06-21 DIAGNOSIS — F322 Major depressive disorder, single episode, severe without psychotic features: Secondary | ICD-10-CM | POA: Diagnosis not present

## 2016-07-28 ENCOUNTER — Emergency Department (HOSPITAL_COMMUNITY): Payer: BLUE CROSS/BLUE SHIELD

## 2016-07-28 ENCOUNTER — Encounter (HOSPITAL_COMMUNITY): Payer: Self-pay | Admitting: Emergency Medicine

## 2016-07-28 ENCOUNTER — Emergency Department (HOSPITAL_COMMUNITY)
Admission: EM | Admit: 2016-07-28 | Discharge: 2016-07-28 | Disposition: A | Discharge status: Home / Self Care | Payer: BLUE CROSS/BLUE SHIELD | Attending: Emergency Medicine | Admitting: Emergency Medicine

## 2016-07-28 DIAGNOSIS — Y999 Unspecified external cause status: Secondary | ICD-10-CM | POA: Insufficient documentation

## 2016-07-28 DIAGNOSIS — Y9301 Activity, walking, marching and hiking: Secondary | ICD-10-CM | POA: Insufficient documentation

## 2016-07-28 DIAGNOSIS — W009XXA Unspecified fall due to ice and snow, initial encounter: Secondary | ICD-10-CM | POA: Insufficient documentation

## 2016-07-28 DIAGNOSIS — Y92009 Unspecified place in unspecified non-institutional (private) residence as the place of occurrence of the external cause: Secondary | ICD-10-CM | POA: Diagnosis not present

## 2016-07-28 DIAGNOSIS — M5441 Lumbago with sciatica, right side: Secondary | ICD-10-CM | POA: Diagnosis not present

## 2016-07-28 DIAGNOSIS — S3992XA Unspecified injury of lower back, initial encounter: Secondary | ICD-10-CM | POA: Diagnosis not present

## 2016-07-28 DIAGNOSIS — S32009A Unspecified fracture of unspecified lumbar vertebra, initial encounter for closed fracture: Secondary | ICD-10-CM | POA: Diagnosis not present

## 2016-07-28 DIAGNOSIS — S32059A Unspecified fracture of fifth lumbar vertebra, initial encounter for closed fracture: Secondary | ICD-10-CM | POA: Diagnosis not present

## 2016-07-28 DIAGNOSIS — W19XXXA Unspecified fall, initial encounter: Secondary | ICD-10-CM

## 2016-07-28 DIAGNOSIS — M545 Low back pain: Secondary | ICD-10-CM | POA: Diagnosis not present

## 2016-07-28 MED ORDER — METHOCARBAMOL 500 MG PO TABS: 500.0000 mg | ORAL_TABLET | Freq: Two times a day (BID) | ORAL | 0 refills | Status: DC

## 2016-07-28 MED ORDER — KETOROLAC TROMETHAMINE 60 MG/2ML IM SOLN
60.0000 mg | Freq: Once | INTRAMUSCULAR | Status: AC
  Administered 2016-07-28: 60 mg via INTRAMUSCULAR
  Filled 2016-07-28: qty 2

## 2016-07-28 MED ORDER — OXYCODONE-ACETAMINOPHEN 5-325 MG PO TABS: 1.0000 | ORAL_TABLET | ORAL | 0 refills | Status: DC | PRN

## 2016-07-28 NOTE — ED Notes (Signed)
ED Provider at bedside. 

## 2016-07-28 NOTE — ED Provider Notes (Signed)
[facility]-EMERGENCY DEPT Provider Note   CSN: [id number] Arrival date & time: 07/28/16  [id number]     History   Chief Complaint Chief Complaint  Patient presents with  . Back Pain    HPI Maria Blackburn is a 46 y.o. female.  The history is provided by the patient and medical records.  Back Pain      46 y.o. F with hx of seasonal allergies, presenting to the ED after a fall which occurred 6 days ago.  Patient states she was walking down the walkway at her house when she slipped on some ice causing her feet to slide out from under her and land on her back on concrete surface.  No head injury or LOC.  States since this time she has had ongoing pain in her low back. States it starts in central low back and radiates into her right buttock and into right posterior thigh. States the muscles in her right leg feel "sore". She denies any numbness or weakness. No bowel or bladder incontinence. No history of back injuries or surgeries in the past. She has tried taking anti-inflammatories, using heating pad, and topical Biofreeze without relief. States she remains ambulatory, but it is uncomfortable.  Past Medical History:  Diagnosis Date  . Allergy     There are no active problems to display for this patient.   Past Surgical History:  Procedure Laterality Date  . ABDOMINAL HYSTERECTOMY    . TUBAL LIGATION      OB History    No data available       Home Medications    Prior to Admission medications   Medication Sig Start Date End Date Taking? Authorizing Provider  hydrOXYzine (ATARAX/VISTARIL) 50 MG tablet Take 50 mg by mouth every 6 (six) hours as needed.    Historical Provider, MD  sertraline (ZOLOFT) 25 MG tablet TAKE 1 TABLET(25 MG) BY MOUTH DAILY 06/11/16   Sherren MochaEva N Shaw, MD  Vitamin D, Ergocalciferol, (DRISDOL) 50000 units CAPS capsule Take 1 capsule (50,000 Units total) by mouth every 7 (seven) days. 03/29/16   Sherren MochaEva N Shaw, MD    Family History Family History  Problem Relation Age of  Onset  . Heart disease Mother   . Diabetes Son     Social History Social History  Substance Use Topics  . Smoking status: Never Smoker  . Smokeless tobacco: Never Used  . Alcohol use No     Allergies   Patient has no known allergies.   Review of Systems Review of Systems  Musculoskeletal: Positive for back pain.  All other systems reviewed and are negative.    Physical Exam Updated Vital Signs BP 130/81 (BP Location: Right Arm)   Pulse 86   Temp 99.1 F (37.3 C) (Oral)   Resp 20   SpO2 95%   Physical Exam  Constitutional: She is oriented to person, place, and time. She appears well-developed and well-nourished.  HENT:  Head: Normocephalic and atraumatic.  Mouth/Throat: Oropharynx is clear and moist.  Eyes: Conjunctivae and EOM are normal. Pupils are equal, round, and reactive to light.  Neck: Normal range of motion.  Cardiovascular: Normal rate, regular rhythm and normal heart sounds.   Pulmonary/Chest: Effort normal and breath sounds normal. No respiratory distress. She has no wheezes.  Abdominal: Soft. Bowel sounds are normal.  Musculoskeletal: Normal range of motion.  Tenderness of the lower lumbar spine and sacrum, no appreciable bony deformity or step-off, full range of motion maintained, pelvis is stable, hips  nontender, no leg shortening or rotation; normal strength and sensation of both legs  Neurological: She is alert and oriented to person, place, and time.  Skin: Skin is warm and dry.  Psychiatric: She has a normal mood and affect.  Nursing note and vitals reviewed.    ED Treatments / Results  Labs (all labs ordered are listed, but only abnormal results are displayed) Labs Reviewed - No data to display  EKG  EKG Interpretation None       Radiology Dg Lumbar Spine Complete  Result Date: 07/28/2016 CLINICAL DATA:  Fall 1 week low, low back pain, right hip pain EXAM: LUMBAR SPINE - COMPLETE 4+ VIEW COMPARISON:  None. FINDINGS: Five views of  the lumbar spine submitted. Mild disc space flattening at L4-L5 level. Mild to moderate disc space flattening at L5-S1 level. There is mild about 3 mm anterolisthesis L5 on S1 vertebral body. Facet degenerative changes noted L4 and L5 level. There is well corticated bony fragment adjacent to right transverse process of L5 measures at least 1.2 cm. This may represent right transverse process fracture of indeterminate age. Clinical correlation is necessary. IMPRESSION: Mild disc space flattening at L4-L5 level. Mild to moderate disc space flattening at L5-S1 level. There is mild about 3 mm anterolisthesis L5 on S1 vertebral body. Facet degenerative changes noted L4 and L5 level. There is well corticated bony fragment adjacent to right transverse process of L5 measures at least 1.2 cm. This may represent right transverse process fracture of indeterminate age. Clinical correlation is necessary. Electronically Signed   By: Natasha Mead M.D.   On: 07/28/2016 08:45   Dg Sacrum/coccyx  Result Date: 07/28/2016 CLINICAL DATA:  Status post fall on a concrete surface 1 week ago with persistent low back and right hip pain. EXAM: SACRUM AND COCCYX - 2+ VIEW COMPARISON:  None in PACs FINDINGS: The sacrum appears adequately mineralized. There are at least 3 intact sacral struts observed. The SI joints are unremarkable. On the lateral view the contour of the sacrum and coccyx is normal. The presacral soft tissues are unremarkable. IMPRESSION: No acute or healing fracture of the sacrum or coccyx is observed. Electronically Signed   By: David  [place] M.D.   On: 07/28/2016 08:41    Procedures Procedures (including critical care time)  Medications Ordered in ED Medications  ketorolac (TORADOL) injection 60 mg (60 mg Intramuscular Given 07/28/16 0804)     Initial Impression / Assessment and Plan / ED Course  I have reviewed the triage vital signs and the nursing notes.  Pertinent labs & imaging results that were available  during my care of the patient were reviewed by me and considered in my medical decision making (see chart for details).  46 year old female here with low back pain after a fall on ice 6 days ago. Reports pain of mid lower back with some radiation to her right buttock and right posterior thigh. No numbness or weakness of her legs. No bowel or bladder incontinence. She is neurologically intact on exam. He does have some tenderness of the lower lumbar vertebrae as well as sacrum but no appreciable deformity to palpation. Given exam findings, x-ray was obtained revealing likely right transverse process fracture as well as disc flattening and anterolisthesis. Patient remains neurologically intact here without focal deficits. No signs or symptoms concerning for cauda equina. Feel she is stable for discharge with outpatient follow-up. She'll be referred to orthopedics. She was given pain control. Encouraged to monitor symptoms at home, avoid  heavy lifting or other activities that will further strain her back.  Discussed plan with patient, she acknowledged understanding and agreed with plan of care.  Return precautions given for new or worsening symptoms.  Case discussed with attending physician, Dr. Jeraldine Loots, who agrees with assessment and treatment plan.  Final Clinical Impressions(s) / ED Diagnoses   Final diagnoses:  Fall, initial encounter  Lumbar transverse process fracture, closed, initial encounter (HCC)    New Prescriptions New Prescriptions   METHOCARBAMOL (ROBAXIN) 500 MG TABLET    Take 1 tablet (500 mg total) by mouth 2 (two) times daily.   OXYCODONE-ACETAMINOPHEN (PERCOCET/ROXICET) 5-325 MG TABLET    Take 1 tablet by mouth every 4 (four) hours as needed.     Garlon Hatchet, PA-C 07/28/16 0925    Garlon Hatchet, PA-C 07/28/16 1125    Gerhard Munch, MD 07/28/16 [phone]

## 2016-07-28 NOTE — ED Notes (Signed)
Pt is in stable condition upon d/c and is escorted from ED via wheelchair. 

## 2016-07-28 NOTE — ED Triage Notes (Signed)
Pt states "last Thursday when we had that snow, I slid on the ice and concrete and I fell on my back". Pt denies hitting head or LOC, said she braced her fall with her L hand. Pt c/o R hip pain and center back pain, pt is ambulatory, pt states when she walks a lot on it it hurts. Pt has no obvious injuries or deformities.

## 2016-07-28 NOTE — Discharge Instructions (Signed)
Take the prescribed medication as directed.  Do not drive while taking percocet, it can make you drowsy.  Can use ice or heat on the back as well if needed. Follow-up with Dr. Aundria Rudogers-- call to make appt today. Return to the ED for new or worsening symptoms-- increased pain, trouble walking, numbness/weakness of the legs, bowel/bladder issues, etc.

## 2016-07-29 DIAGNOSIS — M5441 Lumbago with sciatica, right side: Secondary | ICD-10-CM | POA: Diagnosis not present

## 2016-08-03 DIAGNOSIS — M533 Sacrococcygeal disorders, not elsewhere classified: Secondary | ICD-10-CM | POA: Diagnosis not present

## 2016-08-03 DIAGNOSIS — M5441 Lumbago with sciatica, right side: Secondary | ICD-10-CM | POA: Diagnosis not present

## 2016-08-10 DIAGNOSIS — M533 Sacrococcygeal disorders, not elsewhere classified: Secondary | ICD-10-CM | POA: Diagnosis not present

## 2016-08-11 DIAGNOSIS — F322 Major depressive disorder, single episode, severe without psychotic features: Secondary | ICD-10-CM | POA: Diagnosis not present

## 2016-08-24 DIAGNOSIS — M5441 Lumbago with sciatica, right side: Secondary | ICD-10-CM | POA: Diagnosis not present

## 2016-08-24 DIAGNOSIS — M5126 Other intervertebral disc displacement, lumbar region: Secondary | ICD-10-CM | POA: Diagnosis not present

## 2016-09-08 DIAGNOSIS — M5126 Other intervertebral disc displacement, lumbar region: Secondary | ICD-10-CM | POA: Diagnosis not present

## 2016-09-23 DIAGNOSIS — M5441 Lumbago with sciatica, right side: Secondary | ICD-10-CM | POA: Diagnosis not present

## 2016-09-24 DIAGNOSIS — M5441 Lumbago with sciatica, right side: Secondary | ICD-10-CM | POA: Diagnosis not present

## 2016-09-24 DIAGNOSIS — M5126 Other intervertebral disc displacement, lumbar region: Secondary | ICD-10-CM | POA: Diagnosis not present

## 2016-09-27 DIAGNOSIS — M5441 Lumbago with sciatica, right side: Secondary | ICD-10-CM | POA: Diagnosis not present

## 2016-10-09 DIAGNOSIS — L308 Other specified dermatitis: Secondary | ICD-10-CM | POA: Diagnosis not present

## 2016-10-11 ENCOUNTER — Emergency Department (HOSPITAL_COMMUNITY)
Admission: EM | Admit: 2016-10-11 | Discharge: 2016-10-11 | Disposition: A | Discharge status: Home / Self Care | Payer: BLUE CROSS/BLUE SHIELD | Attending: Emergency Medicine | Admitting: Emergency Medicine

## 2016-10-11 ENCOUNTER — Encounter (HOSPITAL_COMMUNITY): Payer: Self-pay | Admitting: *Deleted

## 2016-10-11 DIAGNOSIS — L309 Dermatitis, unspecified: Secondary | ICD-10-CM | POA: Insufficient documentation

## 2016-10-11 DIAGNOSIS — R21 Rash and other nonspecific skin eruption: Secondary | ICD-10-CM | POA: Diagnosis present

## 2016-10-11 LAB — CBC WITH DIFFERENTIAL/PLATELET
BASOS ABS: 0 10*3/uL (ref 0.0–0.1)
BASOS PCT: 0 %
EOS ABS: 0.4 10*3/uL (ref 0.0–0.7)
Eosinophils Relative: 5 %
HEMATOCRIT: 41.6 % (ref 36.0–46.0)
Hemoglobin: 14.1 g/dL (ref 12.0–15.0)
Lymphocytes Relative: 19 %
Lymphs Abs: 1.6 10*3/uL (ref 0.7–4.0)
MCH: 30.5 pg (ref 26.0–34.0)
MCHC: 33.9 g/dL (ref 30.0–36.0)
MCV: 89.8 fL (ref 78.0–100.0)
MONO ABS: 0.4 10*3/uL (ref 0.1–1.0)
Monocytes Relative: 4 %
NEUTROS ABS: 5.8 10*3/uL (ref 1.7–7.7)
NEUTROS PCT: 72 %
Platelets: 312 10*3/uL (ref 150–400)
RBC: 4.63 MIL/uL (ref 3.87–5.11)
RDW: 13.6 % (ref 11.5–15.5)
WBC: 8.2 10*3/uL (ref 4.0–10.5)

## 2016-10-11 LAB — COMPREHENSIVE METABOLIC PANEL
ALK PHOS: 45 U/L (ref 38–126)
ALT: 28 U/L (ref 14–54)
ANION GAP: 7 (ref 5–15)
AST: 24 U/L (ref 15–41)
Albumin: 4.2 g/dL (ref 3.5–5.0)
BILIRUBIN TOTAL: 1 mg/dL (ref 0.3–1.2)
BUN: 9 mg/dL (ref 6–20)
CALCIUM: 9.1 mg/dL (ref 8.9–10.3)
CO2: 24 mmol/L (ref 22–32)
Chloride: 108 mmol/L (ref 101–111)
Creatinine, Ser: 0.68 mg/dL (ref 0.44–1.00)
Glucose, Bld: 96 mg/dL (ref 65–99)
Potassium: 3.9 mmol/L (ref 3.5–5.1)
Sodium: 139 mmol/L (ref 135–145)
TOTAL PROTEIN: 6.7 g/dL (ref 6.5–8.1)

## 2016-10-11 MED ORDER — PREDNISONE 10 MG PO TABS: ORAL_TABLET | ORAL | 0 refills | Status: DC

## 2016-10-11 NOTE — ED Triage Notes (Signed)
PT reports she came in contact with possible poison sumac .  Pt was seen at [facility] on on SAT. But rash is now worse. Pt reports joint pain and rash on Bil. Hands and legs.

## 2016-10-11 NOTE — ED Notes (Signed)
Declined W/C at D/C and was escorted to lobby by RN. 

## 2016-10-11 NOTE — ED Provider Notes (Signed)
[facility]-EMERGENCY DEPT Provider Note   CSN: [id number] Arrival date & time: 10/11/16  4098  By signing my name below, I, Majel Homer, attest that this documentation has been prepared under the direction and in the presence of non-physician practitioner, Ok Edwards, PA-C. Electronically Signed: Majel Homer, Scribe. 10/11/2016. 10:21 AM.  History   Chief Complaint Chief Complaint  Patient presents with  . Rash   The history is provided by the patient. No language interpreter was used.   HPI Comments: Maria Blackburn is a 46 y.o. female who presents to the Emergency Department complaining of a gradually worsening, painful rash that began ~1 week ago. Pt reports her rash began in the webspace between her fingers and has now spread to her bilateral hands, feet, and groin. She states she was walking in grass ~1 week ago during an egg hunt with her neice and also "cut some clovers" in her backyard ~3 days ago. She notes associated chills, swelling to her bilateral hands and feet, joint pain, and left forearm pain that she attributes to the swelling. Pt reports hx of similar symptoms a few years ago s/p coming in contact with poison sumac. She states she visited the [facility] walk-in clinic 2 days ago and was prescribed a topical ointment with no relief and believes her rash has worsened since then. She denies any fever, recent exposure to STD, hx of smoking or EtOH use, recent sickness, or contact with sick contacts.   Past Medical History:  Diagnosis Date  . Allergy    There are no active problems to display for this patient.  Past Surgical History:  Procedure Laterality Date  . ABDOMINAL HYSTERECTOMY    . TUBAL LIGATION      OB History    No data available     Home Medications    Prior to Admission medications   Medication Sig Start Date End Date Taking? Authorizing Provider  hydrOXYzine (ATARAX/VISTARIL) 50 MG tablet Take 50 mg by mouth every 6 (six) hours as needed.     Historical Provider, MD  methocarbamol (ROBAXIN) 500 MG tablet Take 1 tablet (500 mg total) by mouth 2 (two) times daily. 07/28/16   Garlon Hatchet, PA-C  oxyCODONE-acetaminophen (PERCOCET/ROXICET) 5-325 MG tablet Take 1 tablet by mouth every 4 (four) hours as needed. 07/28/16   Garlon Hatchet, PA-C  sertraline (ZOLOFT) 25 MG tablet TAKE 1 TABLET(25 MG) BY MOUTH DAILY 06/11/16   Sherren Mocha, MD  Vitamin D, Ergocalciferol, (DRISDOL) 50000 units CAPS capsule Take 1 capsule (50,000 Units total) by mouth every 7 (seven) days. 03/29/16   Sherren Mocha, MD    Family History Family History  Problem Relation Age of Onset  . Heart disease Mother   . Diabetes Son    Social History Social History  Substance Use Topics  . Smoking status: Never Smoker  . Smokeless tobacco: Never Used  . Alcohol use No   Allergies   Patient has no known allergies.  Review of Systems Review of Systems  Constitutional: Negative for fever.  Musculoskeletal: Positive for joint swelling and myalgias.  Skin: Positive for rash.   Physical Exam Updated Vital Signs BP (!) 160/91 (BP Location: Right Arm)   Pulse 94   Temp 99.7 F (37.6 C) (Oral)   Resp 16   Ht  (1.702 m)   Wt 203 lb (92.1 kg)   SpO2 97%   BMI 31.79 kg/m   Physical Exam  Constitutional: She is oriented  to person, place, and time. She appears well-developed and well-nourished.  HENT:  Head: Normocephalic.  Eyes: EOM are normal.  Neck: Normal range of motion.  Pulmonary/Chest: Effort normal.  Abdominal: She exhibits no distension.  Musculoskeletal: Normal range of motion.  Neurological: She is alert and oriented to person, place, and time.  Psychiatric: She has a normal mood and affect.  Nursing note and vitals reviewed.  ED Treatments / Results  DIAGNOSTIC STUDIES:  Oxygen Saturation is 97% on RA, normal by my interpretation.    COORDINATION OF CARE:  10:18 AM Discussed treatment plan with pt at bedside and pt agreed to  plan.  Labs (all labs ordered are listed, but only abnormal results are displayed) Labs Reviewed - No data to display  EKG  EKG Interpretation None       Radiology No results found.  Procedures Procedures (including critical care time)  Medications Ordered in ED Medications - No data to display  Initial Impression / Assessment and Plan / ED Course  I have reviewed the triage vital signs and the nursing notes.  Pertinent labs & imaging results that were available during my care of the patient were reviewed by me and considered in my medical decision making (see chart for details).       Final Clinical Impressions(s) / ED Diagnoses   Final diagnoses:  Eczema, unspecified type    New Prescriptions Discharge Medication List as of 10/11/2016 12:18 PM    An After Visit Summary was printed and given to the patient. Meds ordered this encounter  Medications  . predniSONE (DELTASONE) 10 MG tablet    Sig: 6,5,4,3,2,1 taper    Dispense:  21 tablet    Refill:  0    Order Specific Question:   Supervising Provider    Answer:   Eber Hong [[id number]]   I personally performed the services in this documentation, which was scribed in my presence.  The recorded information has been reviewed and considered.   Barnet Pall.    Lonia Skinner [place], PA-C 10/11/16 1600    Pricilla Loveless, MD 10/15/16 [phone]

## 2016-10-11 NOTE — Discharge Instructions (Signed)
Schedule to see the Dermatologist if symptoms persist.

## 2016-10-11 NOTE — ED Notes (Signed)
Pt states rash on hands, groin and feett have been there since last Monday , pt believes it is poison sumac, states has been seen before for this , has used benadryl, creams and  oatmeal baths and it still itchy

## 2016-10-12 LAB — RPR: RPR: NONREACTIVE

## 2016-10-20 DIAGNOSIS — G5601 Carpal tunnel syndrome, right upper limb: Secondary | ICD-10-CM | POA: Diagnosis not present

## 2016-10-20 DIAGNOSIS — F322 Major depressive disorder, single episode, severe without psychotic features: Secondary | ICD-10-CM | POA: Diagnosis not present

## 2016-10-20 DIAGNOSIS — M5441 Lumbago with sciatica, right side: Secondary | ICD-10-CM | POA: Diagnosis not present

## 2016-10-22 DIAGNOSIS — M5441 Lumbago with sciatica, right side: Secondary | ICD-10-CM | POA: Diagnosis not present

## 2016-10-22 DIAGNOSIS — M533 Sacrococcygeal disorders, not elsewhere classified: Secondary | ICD-10-CM | POA: Diagnosis not present

## 2016-10-22 DIAGNOSIS — M5126 Other intervertebral disc displacement, lumbar region: Secondary | ICD-10-CM | POA: Diagnosis not present

## 2016-10-27 DIAGNOSIS — M5441 Lumbago with sciatica, right side: Secondary | ICD-10-CM | POA: Diagnosis not present

## 2016-11-08 ENCOUNTER — Other Ambulatory Visit: Payer: Self-pay | Admitting: Family Medicine

## 2016-11-09 NOTE — Telephone Encounter (Signed)
03/2016 last ov and labs 

## 2016-11-10 DIAGNOSIS — M5126 Other intervertebral disc displacement, lumbar region: Secondary | ICD-10-CM | POA: Diagnosis not present

## 2017-01-04 DIAGNOSIS — M5441 Lumbago with sciatica, right side: Secondary | ICD-10-CM | POA: Diagnosis not present

## 2017-01-18 DIAGNOSIS — M5441 Lumbago with sciatica, right side: Secondary | ICD-10-CM | POA: Diagnosis not present

## 2017-01-27 DIAGNOSIS — M5431 Sciatica, right side: Secondary | ICD-10-CM | POA: Diagnosis not present

## 2017-01-27 DIAGNOSIS — M6281 Muscle weakness (generalized): Secondary | ICD-10-CM | POA: Diagnosis not present

## 2017-01-27 DIAGNOSIS — M545 Low back pain: Secondary | ICD-10-CM | POA: Diagnosis not present

## 2017-01-27 DIAGNOSIS — M79604 Pain in right leg: Secondary | ICD-10-CM | POA: Diagnosis not present

## 2017-02-03 DIAGNOSIS — M79604 Pain in right leg: Secondary | ICD-10-CM | POA: Diagnosis not present

## 2017-02-03 DIAGNOSIS — M545 Low back pain: Secondary | ICD-10-CM | POA: Diagnosis not present

## 2017-02-03 DIAGNOSIS — M6281 Muscle weakness (generalized): Secondary | ICD-10-CM | POA: Diagnosis not present

## 2017-02-03 DIAGNOSIS — M5431 Sciatica, right side: Secondary | ICD-10-CM | POA: Diagnosis not present

## 2017-02-03 DIAGNOSIS — N3946 Mixed incontinence: Secondary | ICD-10-CM | POA: Diagnosis not present

## 2017-02-03 DIAGNOSIS — R102 Pelvic and perineal pain: Secondary | ICD-10-CM | POA: Diagnosis not present

## 2017-02-03 DIAGNOSIS — N3010 Interstitial cystitis (chronic) without hematuria: Secondary | ICD-10-CM | POA: Diagnosis not present

## 2017-02-10 DIAGNOSIS — M6281 Muscle weakness (generalized): Secondary | ICD-10-CM | POA: Diagnosis not present

## 2017-02-10 DIAGNOSIS — M79604 Pain in right leg: Secondary | ICD-10-CM | POA: Diagnosis not present

## 2017-02-10 DIAGNOSIS — M5431 Sciatica, right side: Secondary | ICD-10-CM | POA: Diagnosis not present

## 2017-02-10 DIAGNOSIS — M545 Low back pain: Secondary | ICD-10-CM | POA: Diagnosis not present

## 2017-03-14 ENCOUNTER — Other Ambulatory Visit: Payer: Self-pay | Admitting: Urology

## 2017-03-16 ENCOUNTER — Encounter (HOSPITAL_BASED_OUTPATIENT_CLINIC_OR_DEPARTMENT_OTHER): Payer: Self-pay | Admitting: *Deleted

## 2017-03-16 NOTE — Progress Notes (Signed)
NPO AFTER MN.  ARRIVE AT 0830.  NEEDS HG. 

## 2017-03-21 NOTE — Progress Notes (Signed)
Received call from pt regarding preop registration. Pt confirmed her conversation w Angelique Blonder.  Pt reminded to arrive @ 0830 on the 20th. Will do hgb on arrival. Pt verbalized her understanding.

## 2017-03-23 DIAGNOSIS — N3000 Acute cystitis without hematuria: Secondary | ICD-10-CM | POA: Diagnosis not present

## 2017-03-23 DIAGNOSIS — N3946 Mixed incontinence: Secondary | ICD-10-CM | POA: Diagnosis not present

## 2017-03-23 DIAGNOSIS — N3010 Interstitial cystitis (chronic) without hematuria: Secondary | ICD-10-CM | POA: Diagnosis not present

## 2017-03-24 ENCOUNTER — Encounter (HOSPITAL_BASED_OUTPATIENT_CLINIC_OR_DEPARTMENT_OTHER)
Admission: RE | Disposition: A | Discharge status: Home / Self Care | Payer: Self-pay | Source: Ambulatory Visit | Attending: Urology

## 2017-03-24 ENCOUNTER — Ambulatory Visit (HOSPITAL_BASED_OUTPATIENT_CLINIC_OR_DEPARTMENT_OTHER): Payer: BLUE CROSS/BLUE SHIELD | Admitting: Anesthesiology

## 2017-03-24 ENCOUNTER — Encounter (HOSPITAL_BASED_OUTPATIENT_CLINIC_OR_DEPARTMENT_OTHER): Payer: Self-pay

## 2017-03-24 ENCOUNTER — Ambulatory Visit (HOSPITAL_BASED_OUTPATIENT_CLINIC_OR_DEPARTMENT_OTHER)
Admission: RE | Admit: 2017-03-24 | Discharge: 2017-03-24 | Disposition: A | Discharge status: Home / Self Care | Payer: BLUE CROSS/BLUE SHIELD | Source: Ambulatory Visit | Attending: Urology | Admitting: Urology

## 2017-03-24 DIAGNOSIS — Z79899 Other long term (current) drug therapy: Secondary | ICD-10-CM | POA: Diagnosis not present

## 2017-03-24 DIAGNOSIS — N3010 Interstitial cystitis (chronic) without hematuria: Secondary | ICD-10-CM | POA: Diagnosis not present

## 2017-03-24 DIAGNOSIS — N3946 Mixed incontinence: Secondary | ICD-10-CM | POA: Insufficient documentation

## 2017-03-24 DIAGNOSIS — F419 Anxiety disorder, unspecified: Secondary | ICD-10-CM | POA: Diagnosis not present

## 2017-03-24 DIAGNOSIS — K219 Gastro-esophageal reflux disease without esophagitis: Secondary | ICD-10-CM | POA: Insufficient documentation

## 2017-03-24 DIAGNOSIS — Z791 Long term (current) use of non-steroidal anti-inflammatories (NSAID): Secondary | ICD-10-CM | POA: Insufficient documentation

## 2017-03-24 DIAGNOSIS — R102 Pelvic and perineal pain: Secondary | ICD-10-CM | POA: Diagnosis not present

## 2017-03-24 DIAGNOSIS — N3000 Acute cystitis without hematuria: Secondary | ICD-10-CM | POA: Diagnosis not present

## 2017-03-24 HISTORY — DX: Anxiety disorder, unspecified: F41.9

## 2017-03-24 HISTORY — PX: CYSTO WITH HYDRODISTENSION: SHX5453

## 2017-03-24 HISTORY — DX: Frequency of micturition: R35.0

## 2017-03-24 HISTORY — DX: Pelvic and perineal pain: R10.2

## 2017-03-24 HISTORY — DX: Stress incontinence (female) (male): N39.3

## 2017-03-24 HISTORY — DX: Other specified disorders of bladder: N32.89

## 2017-03-24 HISTORY — DX: Urgency of urination: R39.15

## 2017-03-24 HISTORY — DX: Gastro-esophageal reflux disease without esophagitis: K21.9

## 2017-03-24 LAB — POCT HEMOGLOBIN-HEMACUE: HEMOGLOBIN: 14.5 g/dL (ref 12.0–15.0)

## 2017-03-24 SURGERY — CYSTOSCOPY, WITH BLADDER HYDRODISTENSION
Anesthesia: General | Site: Bladder

## 2017-03-24 MED ORDER — PROPOFOL 10 MG/ML IV BOLUS
INTRAVENOUS | Status: DC | PRN
  Administered 2017-03-24: 200 mg via INTRAVENOUS

## 2017-03-24 MED ORDER — MORPHINE SULFATE (PF) 2 MG/ML IV SOLN
2.0000 mg | INTRAVENOUS | Status: DC | PRN
  Filled 2017-03-24: qty 1

## 2017-03-24 MED ORDER — ONDANSETRON HCL 4 MG/2ML IJ SOLN
INTRAMUSCULAR | Status: AC
  Filled 2017-03-24: qty 2

## 2017-03-24 MED ORDER — ONDANSETRON HCL 4 MG/2ML IJ SOLN
4.0000 mg | Freq: Four times a day (QID) | INTRAMUSCULAR | Status: DC | PRN
  Filled 2017-03-24: qty 2

## 2017-03-24 MED ORDER — FENTANYL CITRATE (PF) 100 MCG/2ML IJ SOLN
INTRAMUSCULAR | Status: AC
  Filled 2017-03-24: qty 2

## 2017-03-24 MED ORDER — CEFAZOLIN SODIUM-DEXTROSE 2-4 GM/100ML-% IV SOLN
INTRAVENOUS | Status: AC
  Filled 2017-03-24: qty 100

## 2017-03-24 MED ORDER — KETOROLAC TROMETHAMINE 30 MG/ML IJ SOLN
INTRAMUSCULAR | Status: DC | PRN
  Administered 2017-03-24: 30 mg via INTRAVENOUS

## 2017-03-24 MED ORDER — CEFAZOLIN SODIUM-DEXTROSE 2-4 GM/100ML-% IV SOLN
2.0000 g | INTRAVENOUS | Status: AC
  Administered 2017-03-24: 2 g via INTRAVENOUS
  Filled 2017-03-24: qty 100

## 2017-03-24 MED ORDER — MIDAZOLAM HCL 2 MG/2ML IJ SOLN
INTRAMUSCULAR | Status: DC | PRN
  Administered 2017-03-24: 2 mg via INTRAVENOUS

## 2017-03-24 MED ORDER — SODIUM CHLORIDE 0.9 % IV SOLN
250.0000 mL | INTRAVENOUS | Status: DC | PRN
  Filled 2017-03-24: qty 250

## 2017-03-24 MED ORDER — WHITE PETROLATUM GEL
Status: AC
  Filled 2017-03-24: qty 5

## 2017-03-24 MED ORDER — PROPOFOL 10 MG/ML IV BOLUS
INTRAVENOUS | Status: AC
  Filled 2017-03-24: qty 40

## 2017-03-24 MED ORDER — OXYCODONE HCL 5 MG/5ML PO SOLN
5.0000 mg | Freq: Once | ORAL | Status: DC | PRN
  Filled 2017-03-24: qty 5

## 2017-03-24 MED ORDER — STERILE WATER FOR IRRIGATION IR SOLN
Status: DC | PRN
  Administered 2017-03-24: 3000 mL

## 2017-03-24 MED ORDER — LACTATED RINGERS IV SOLN
INTRAVENOUS | Status: DC
  Administered 2017-03-24: 09:00:00 via INTRAVENOUS
  Filled 2017-03-24: qty 1000

## 2017-03-24 MED ORDER — ACETAMINOPHEN 325 MG PO TABS
650.0000 mg | ORAL_TABLET | ORAL | Status: DC | PRN
  Filled 2017-03-24: qty 2

## 2017-03-24 MED ORDER — SODIUM CHLORIDE 0.9% FLUSH
3.0000 mL | INTRAVENOUS | Status: DC | PRN
  Filled 2017-03-24: qty 3

## 2017-03-24 MED ORDER — FENTANYL CITRATE (PF) 100 MCG/2ML IJ SOLN
25.0000 ug | INTRAMUSCULAR | Status: DC | PRN
  Filled 2017-03-24: qty 1

## 2017-03-24 MED ORDER — ONDANSETRON HCL 4 MG/2ML IJ SOLN
INTRAMUSCULAR | Status: DC | PRN
  Administered 2017-03-24: 4 mg via INTRAVENOUS

## 2017-03-24 MED ORDER — FENTANYL CITRATE (PF) 100 MCG/2ML IJ SOLN
INTRAMUSCULAR | Status: DC | PRN
  Administered 2017-03-24: 50 ug via INTRAVENOUS

## 2017-03-24 MED ORDER — ACETAMINOPHEN 650 MG RE SUPP
650.0000 mg | RECTAL | Status: DC | PRN
  Filled 2017-03-24: qty 1

## 2017-03-24 MED ORDER — HYDROCODONE-ACETAMINOPHEN 5-325 MG PO TABS: 1.0000 | ORAL_TABLET | Freq: Four times a day (QID) | ORAL | 0 refills | Status: DC | PRN

## 2017-03-24 MED ORDER — KETOROLAC TROMETHAMINE 30 MG/ML IJ SOLN
INTRAMUSCULAR | Status: AC
  Filled 2017-03-24: qty 1

## 2017-03-24 MED ORDER — OXYCODONE HCL 5 MG PO TABS
5.0000 mg | ORAL_TABLET | Freq: Once | ORAL | Status: DC | PRN
  Filled 2017-03-24: qty 1

## 2017-03-24 MED ORDER — SODIUM CHLORIDE 0.9% FLUSH
3.0000 mL | Freq: Two times a day (BID) | INTRAVENOUS | Status: DC
  Filled 2017-03-24: qty 3

## 2017-03-24 MED ORDER — DEXAMETHASONE SODIUM PHOSPHATE 10 MG/ML IJ SOLN
INTRAMUSCULAR | Status: DC | PRN
  Administered 2017-03-24: 10 mg via INTRAVENOUS

## 2017-03-24 MED ORDER — OXYCODONE HCL 5 MG PO TABS
5.0000 mg | ORAL_TABLET | ORAL | Status: DC | PRN
  Filled 2017-03-24: qty 2

## 2017-03-24 MED ORDER — DEXAMETHASONE SODIUM PHOSPHATE 10 MG/ML IJ SOLN
INTRAMUSCULAR | Status: AC
  Filled 2017-03-24: qty 1

## 2017-03-24 MED ORDER — BUPIVACAINE HCL (PF) 0.5 % IJ SOLN
INTRAMUSCULAR | Status: DC | PRN
  Administered 2017-03-24: 11:00:00 via INTRAVESICAL

## 2017-03-24 MED ORDER — CEPHALEXIN 500 MG PO CAPS: 500.0000 mg | ORAL_CAPSULE | Freq: Four times a day (QID) | ORAL | 0 refills | Status: AC

## 2017-03-24 MED ORDER — LIDOCAINE 2% (20 MG/ML) 5 ML SYRINGE
INTRAMUSCULAR | Status: DC | PRN
  Administered 2017-03-24: 80 mg via INTRAVENOUS

## 2017-03-24 MED ORDER — LIDOCAINE 2% (20 MG/ML) 5 ML SYRINGE
INTRAMUSCULAR | Status: AC
Start: 2017-03-24 — End: 2017-03-24
  Filled 2017-03-24: qty 5

## 2017-03-24 MED ORDER — MIDAZOLAM HCL 2 MG/2ML IJ SOLN
INTRAMUSCULAR | Status: AC
  Filled 2017-03-24: qty 2

## 2017-03-24 MED ORDER — PHENAZOPYRIDINE HCL 200 MG PO TABS: 200.0000 mg | ORAL_TABLET | Freq: Three times a day (TID) | ORAL | 0 refills | Status: DC | PRN

## 2017-03-24 MED FILL — CEPHALEXIN 500 MG CAPSULE: 500 | 2 days supply | Qty: 8 | Fill #0

## 2017-03-24 MED FILL — HYDROCODON-APAP 5-325: 5-325 | 2 days supply | Qty: 8 | Fill #0

## 2017-03-24 SURGICAL SUPPLY — 21 items
BAG DRAIN URO-CYSTO SKYTR STRL (DRAIN) ×2 IMPLANT
CATH ROBINSON RED A/P 16FR (CATHETERS) ×2 IMPLANT
CLOTH BEACON ORANGE TIMEOUT ST (SAFETY) ×2 IMPLANT
ELECT REM PT RETURN 9FT ADLT (ELECTROSURGICAL) ×2
ELECTRODE REM PT RTRN 9FT ADLT (ELECTROSURGICAL) ×1 IMPLANT
GLOVE BIO SURGEON STRL SZ 6.5 (GLOVE) ×2 IMPLANT
GLOVE BIOGEL PI IND STRL 6.5 (GLOVE) ×1 IMPLANT
GLOVE BIOGEL PI INDICATOR 6.5 (GLOVE) ×1
GLOVE SURG SS PI 8.0 STRL IVOR (GLOVE) ×2 IMPLANT
GOWN STRL REUS W/ TWL XL LVL3 (GOWN DISPOSABLE) IMPLANT
GOWN STRL REUS W/TWL LRG LVL3 (GOWN DISPOSABLE) ×2 IMPLANT
GOWN STRL REUS W/TWL XL LVL3 (GOWN DISPOSABLE) ×2 IMPLANT
KIT RM TURNOVER CYSTO AR (KITS) ×2 IMPLANT
MANIFOLD NEPTUNE II (INSTRUMENTS) ×2 IMPLANT
NDL SAFETY ECLIPSE 18X1.5 (NEEDLE) ×1 IMPLANT
NEEDLE HYPO 18GX1.5 SHARP (NEEDLE) ×1
NS IRRIG 500ML POUR BTL (IV SOLUTION) IMPLANT
PACK CYSTO (CUSTOM PROCEDURE TRAY) ×2 IMPLANT
SYR 30ML LL (SYRINGE) ×2 IMPLANT
TUBE CONNECTING 12X1/4 (SUCTIONS) IMPLANT
WATER STERILE IRR 3000ML UROMA (IV SOLUTION) ×2 IMPLANT

## 2017-03-24 NOTE — Anesthesia Preprocedure Evaluation (Signed)
Anesthesia Evaluation  Patient identified by MRN, date of birth, ID band Patient awake    Reviewed: Allergy & Precautions, H&P , NPO status , Patient's Chart, lab work & pertinent test results  Airway Mallampati: II   Neck ROM: full    Dental   Pulmonary neg pulmonary ROS,    breath sounds clear to auscultation       Cardiovascular negative cardio ROS   Rhythm:regular Rate:Normal     Neuro/Psych PSYCHIATRIC DISORDERS Anxiety    GI/Hepatic GERD  ,  Endo/Other    Renal/GU    SUI    Musculoskeletal   Abdominal   Peds  Hematology   Anesthesia Other Findings   Reproductive/Obstetrics                             Anesthesia Physical Anesthesia Plan  ASA: II  Anesthesia Plan: General   Post-op Pain Management:    Induction: Intravenous  PONV Risk Score and Plan: 3 and Ondansetron, Dexamethasone, Midazolam and Treatment may vary due to age or medical condition  Airway Management Planned: LMA  Additional Equipment:   Intra-op Plan:   Post-operative Plan:   Informed Consent: I have reviewed the patients History and Physical, chart, labs and discussed the procedure including the risks, benefits and alternatives for the proposed anesthesia with the patient or authorized representative who has indicated his/her understanding and acceptance.     Plan Discussed with: CRNA, Anesthesiologist and Surgeon  Anesthesia Plan Comments:         Anesthesia Quick Evaluation

## 2017-03-24 NOTE — Anesthesia Procedure Notes (Signed)
Procedure Name: LMA Insertion Date/Time: 03/24/2017 10:31 AM Performed by: Renella Cunas D Pre-anesthesia Checklist: Patient identified, Emergency Drugs available, Suction available and Patient being monitored Patient Re-evaluated:Patient Re-evaluated prior to induction Oxygen Delivery Method: Circle system utilized Preoxygenation: Pre-oxygenation with 100% oxygen Induction Type: IV induction Ventilation: Mask ventilation without difficulty LMA: LMA inserted LMA Size: 4.0 Number of attempts: 1 Airway Equipment and Method: Bite block Placement Confirmation: positive ETCO2 Tube secured with: Tape Dental Injury: Teeth and Oropharynx as per pre-operative assessment

## 2017-03-24 NOTE — Op Note (Signed)
NAME:  Maria Blackburn, Maria Blackburn                     ACCOUNT NO.:  MEDICAL RECORD NO.:  [id number]  LOCATION:                                 FACILITY:  PHYSICIAN:  Excell Seltzer. Annabell Howells, M.D.         DATE OF BIRTH:  DATE OF PROCEDURE:  03/24/2017 DATE OF DISCHARGE:                              OPERATIVE REPORT   PROCEDURE:  Cystoscopy with hydrodistention of bladder and instillation of Pyridium and Marcaine.  PREOPERATIVE DIAGNOSIS:  Pelvic pain with probable interstitial cystitis.  POSTOPERATIVE DIAGNOSIS:  Interstitial cystitis.  SURGEON:  Excell Seltzer. Annabell Howells, M.D.  ANESTHESIA:  General.  SPECIMEN:  None.  DRAINS:  None.  BLOOD LOSS:  None.  COMPLICATIONS:  None.  INDICATIONS:  [name] is a 46 year old white female with pelvic pain suggestive for interstitial cystitis.  She is to undergo hydrodistention of the bladder.  FINDINGS AND PROCEDURE:  She was given Ancef.  She was taken to the operating room where general anesthetic was induced.  She was placed in lithotomy position.  Her perineum and genitalia were prepped with Betadine solution and she was draped in usual sterile fashion.  She was fitted with PAS hose prior to that.  Cystoscopy was performed with a 23-French scope and 30-degree lens. Examination revealed a normal urethra.  The bladder wall had mild trabeculation with normal mucosa.  The ureteral orifices were unremarkable.  The bladder was distended under 80 cm of water pressure to capacity and then drained.  Her capacity under anesthesia was approximately 750 mL. After drainage, re-cystoscopy showed glomerulations consistent with interstitial cystitis in the trigone and posterior wall.  There was bloody efflux as well from bladder wall correcting correction from the glomerulations.  At this point, the bladder was drained.  A red rubber catheter was placed, and the bladder was instilled with 15 mL of 0.5% Marcaine with 400 mg crushed Pyridium.  She was then taken down  from lithotomy position.  Her anesthetic was reversed.  She was moved to recovery room in stable condition.  There were no complications.     Excell Seltzer. Annabell Howells, M.D.     JJW/MEDQ  D:  03/24/2017  T:  03/24/2017  Job:  [id number]

## 2017-03-24 NOTE — Brief Op Note (Signed)
03/24/2017  10:47 AM  PATIENT:  Maria Blackburn  46 y.o. female  PRE-OPERATIVE DIAGNOSIS:  PELVIC PAIN   POST-OPERATIVE DIAGNOSIS:  PELVIC PAIN   PROCEDURE:  Procedure(s): CYSTOSCOPY/HYDRODISTENSION INSTILLATION OF MARCAINE AND PYRIDIUM (N/A)  SURGEON:  Surgeon(s) and Role:    * Bjorn Pippin, MD - Primary  PHYSICIAN ASSISTANT:   ASSISTANTS: none   ANESTHESIA:   general  EBL:  Total I/O In: 200 [I.V.:200] Out: -   BLOOD ADMINISTERED:none  DRAINS: none   LOCAL MEDICATIONS USED:  MARCAINE    and Amount: 15 ml 0.5%  SPECIMEN:  No Specimen  DISPOSITION OF SPECIMEN:  N/A  COUNTS:  YES  TOURNIQUET:  * No tourniquets in log *  DICTATION: .Other Dictation: Dictation Number [id number]  PLAN OF CARE: Discharge to home after PACU  PATIENT DISPOSITION:  PACU - hemodynamically stable.   Delay start of Pharmacological VTE agent (>24hrs) due to surgical blood loss or risk of bleeding: not applicable

## 2017-03-24 NOTE — Discharge Instructions (Addendum)
CYSTOSCOPY HOME CARE INSTRUCTIONS  Activity: Rest for the remainder of the day.  Do not drive or operate equipment today.  You may resume normal activities in one to two days as instructed by your physician.   Meals: Drink plenty of liquids and eat light foods such as gelatin or soup this evening.  You may return to a normal meal plan tomorrow.  Return to Work: You may return to work in one to two days or as instructed by your physician.  Special Instructions / Symptoms: Call your physician if any of these symptoms occur:   -persistent or heavy bleeding  -bleeding which continues after first few urination  -large blood clots that are difficult to pass  -urine stream diminishes or stops completely  -fever equal to or higher than 101 degrees Farenheit.  -cloudy urine with a strong, foul odor  -severe pain  Females should always wipe from front to back after elimination.  You may feel some burning pain when you urinate.  This should disappear with time.  Applying moist heat to the lower abdomen or a hot tub bath may help relieve the pain. \     Post Anesthesia Home Care Instructions  Activity: Get plenty of rest for the remainder of the day. A responsible individual must stay with you for 24 hours following the procedure.  For the next 24 hours, DO NOT: -Drive a car -Operate machinery -Drink alcoholic beverages -Take any medication unless instructed by your physician -Make any legal decisions or sign important papers.  Meals: Start with liquid foods such as gelatin or soup. Progress to regular foods as tolerated. Avoid greasy, spicy, heavy foods. If nausea and/or vomiting occur, drink only clear liquids until the nausea and/or vomiting subsides. Call your physician if vomiting continues.  Special Instructions/Symptoms: Your throat may feel dry or sore from the anesthesia or the breathing tube placed in your throat during surgery. If this causes discomfort, gargle with warm salt  water. The discomfort should disappear within 24 hours.  If you had a scopolamine patch placed behind your ear for the management of post- operative nausea and/or vomiting:  1. The medication in the patch is effective for 72 hours, after which it should be removed.  Wrap patch in a tissue and discard in the trash. Wash hands thoroughly with soap and water. 2. You may remove the patch earlier than 72 hours if you experience unpleasant side effects which may include dry mouth, dizziness or visual disturbances. 3. Avoid touching the patch. Wash your hands with soap and water after contact with the patch.     

## 2017-03-24 NOTE — H&P (Signed)
CC/HPI: CC: Bladder spasm, frequency and dysuria.   Hx: Maria Blackburn is a 46 yo WM former patient of Dr. Isabel Caprice who was last seen in 2009 for pelvic pain and incontinence. She was going to have an HOD but didn't have insurance. She returns today with progressive symptoms. She has IBS with alternating diarrhea and constipation and when she is constipated her bladder area is worse. She has frequency q1hr or more. She has nocturia q2hrs but if she doesn't she has increased spasms and pain. She has some SUI with sneezing and coughing. She can have some UUI. She occasionally wears pads. She has some hesitancy. She has a reduced stream at times. She has some intermittency. She is having crampy pelvic spasms. She gets relief with voiding. She has had no hematuria but has had some dysuria at times. Her symptoms are worse with acidic food and beverages. She has had no recent UTI's and has no history of stones. She has had no GU surgery.   03/23/17: Patient is scheduled for cystoscopy with HOD on 9/20. She presents today for preoperative appointment. She continues to complain of frequency, nocturia, and spasms that have not changed from her previous visit. She continues to have some intermittent dysuria, which AZO works well for. She denies any current symptoms and feels well today. No gross hematuria or fevers. She has had no problems with general anesthesia in the past. No recent episodes of chest pain or shortness of breath. No cardiac history and she is not diabetic.     ALLERGIES: No Allergies    MEDICATIONS: Zoloft     GU PSH: Hysterectomy Unilat SO - 2009      PSH Notes: Hysterectomy   NON-GU PSH: Bilateral Tubal Ligation    GU PMH: Mixed incontinence, She has mild MUI that doesn't require pads. - 02/03/2017 Cystocele, midline, Cystocele, midline - 2014 Interstitial Cystitis (w/o hematuria), Chronic interstitial cystitis without hematuria - 2014 Pelvic/perineal pain, Female pelvic pain - 2014 Stress  Incontinence, Female stress incontinence - 2014      PMH Notes:  1898-07-05 00:00:00 - Note: Normal Routine History And Physical Adult   NON-GU PMH: Anxiety, Anxiety - 2014 Personal history of other specified conditions, History of heartburn - 2014 Depression GERD Mixed irritable bowel syndrome    FAMILY HISTORY: Acute Myocardial Infarction - Mother Breast Cancer - Grandmother History of nephrectomy - Mother nephrolithiasis - Mother renal failure - Mother    Notes: 2 sons   SOCIAL HISTORY: Marital Status: Married Preferred Language: English; Ethnicity: Not Hispanic Or Latino; Race: White Current Smoking Status: Patient has never smoked.   Tobacco Use Assessment Completed: Used Tobacco in last 30 days? Does not use smokeless tobacco. Has never drank.  Does not drink caffeine. Patient's occupation Advertising account planner.     Notes: Never Smoked, Marital History - Currently Married, Caffeine Use: 1 every other day, Alcohol Use, Occupation:   REVIEW OF SYSTEMS:    GU Review Female:   Patient reports frequent urination, hard to postpone urination, get up at night to urinate, leakage of urine, stream starts and stops, trouble starting your stream, and have to strain to urinate. Patient denies burning /pain with urination and being pregnant.  Gastrointestinal (Upper):   Patient reports indigestion/ heartburn. Patient denies nausea and vomiting.  Gastrointestinal (Lower):   Patient denies diarrhea and constipation.  Constitutional:   Patient reports night sweats. Patient denies fever, weight loss, and fatigue.  Skin:   Patient denies itching and skin rash/ lesion.  Eyes:  Patient denies blurred vision and double vision.  Ears/ Nose/ Throat:   Patient denies sore throat and sinus problems.  Hematologic/Lymphatic:   Patient denies swollen glands and easy bruising.  Cardiovascular:   Patient denies leg swelling and chest pains.  Respiratory:   Patient denies cough and shortness of  breath.  Endocrine:   Patient reports excessive thirst.   Musculoskeletal:   Patient reports back pain and joint pain.   Neurological:   Patient reports headaches. Patient denies dizziness.  Psychologic:   Patient reports anxiety. Patient denies depression.   VITAL SIGNS:      03/23/2017 04:15 PM  Weight 190 lb / 86.18 kg  Height 67 in / 170.18 cm  BP 146/94 mmHg  Pulse 83 /min  Temperature 98.2 F / 36.7 C  BMI 29.8 kg/m   MULTI-SYSTEM PHYSICAL EXAMINATION:    Constitutional: Well-nourished. No physical deformities. Normally developed. Good grooming.  Respiratory: Normal breath sounds. No labored breathing, no use of accessory muscles.   Cardiovascular: Regular rate and rhythm. No murmur, no gallop. Normal temperature, no swelling, no varicosities.   Skin: No paleness, no jaundice, no cyanosis. No lesion, no ulcer, no rash.  Neurologic / Psychiatric: Oriented to time, oriented to place, oriented to person. No depression, no anxiety, no agitation.  Gastrointestinal: No mass, no tenderness, no rigidity, non obese abdomen.  Musculoskeletal: Spine, ribs, pelvis no bilateral tenderness. Normal gait and station of head and neck.     PAST DATA REVIEWED:  Source Of History:  Patient  Records Review:   Previous Patient Records  Urine Test Review:   Urinalysis  Urodynamics Review:   Review Bladder Scan   03/23/17  Urinalysis  Urine Appearance Cloudy   Urine Color Yellow   Urine Glucose Neg   Urine Bilirubin Neg   Urine Ketones Neg   Urine Specific Gravity 1.025   Urine Blood Neg   Urine pH 6.0   Urine Protein Neg   Urine Urobilinogen 1.0   Urine Nitrites Neg   Urine Leukocyte Esterase Neg   Urine WBC/hpf 0 - 5/hpf   Urine RBC/hpf NS (Not Seen)   Urine Epithelial Cells 0 - 5/hpf   Urine Bacteria Mod (26-50/hpf)   Urine Mucous Present   Urine Yeast NS (Not Seen)   Urine Trichomonas Not Present   Urine Cystals NS (Not Seen)   Urine Casts NS (Not Seen)   Urine Sperm Not  Present    PROCEDURES: None   ASSESSMENT:      ICD-10 Details  1 GU:   Mixed incontinence - N39.46   2   Interstitial Cystitis (w/o hematuria) - N30.10    PLAN:           Schedule Labs: Today 03/22/2017 - Urine Culture          Document Letter(s):  Created for Patient: Clinical Summary         Notes:   Urinalysis today does show moderate bacteriuria. I will send for urine culture to have as a baseline. We again discussed the indications and risks of cystoscopy with HOD in detail. She voiced understanding. I answered all of her questions to the best of my ability today. I will plan to have her follow up as planned for her scheduled procedure. UA findings discussed with Dr. Annabell Howells.

## 2017-03-24 NOTE — Transfer of Care (Signed)
Immediate Anesthesia Transfer of Care Note  Patient: Maria Blackburn  Procedure(s) Performed: Procedure(s) (LRB): CYSTOSCOPY/HYDRODISTENSION INSTILLATION OF MARCAINE AND PYRIDIUM (N/A)  Patient Location: PACU  Anesthesia Type: General  Level of Consciousness: awake, oriented, sedated and patient cooperative  Airway & Oxygen Therapy: Patient Spontanous Breathing and Patient connected to face mask oxygen  Post-op Assessment: Report given to PACU RN and Post -op Vital signs reviewed and stable  Post vital signs: Reviewed and stable  Complications: No apparent anesthesia complications  Last Vitals:  Vitals:   03/24/17 0824 03/24/17 1053  BP: (!) 154/74 126/73  Pulse: 85 79  Resp: 16 16  Temp: 36.6 C 36.6 C  SpO2: 98% 99%    Last Pain:  Vitals:   03/24/17 0824  TempSrc: Oral      Patients Stated Pain Goal: 5 (03/24/17 0834)

## 2017-03-25 ENCOUNTER — Encounter (HOSPITAL_BASED_OUTPATIENT_CLINIC_OR_DEPARTMENT_OTHER): Payer: Self-pay | Admitting: Urology

## 2017-03-25 NOTE — Anesthesia Postprocedure Evaluation (Signed)
Anesthesia Post Note  Patient: Maria Blackburn  Procedure(s) Performed: Procedure(s) (LRB): CYSTOSCOPY/HYDRODISTENSION INSTILLATION OF MARCAINE AND PYRIDIUM (N/A)     Patient location during evaluation: PACU Anesthesia Type: General Level of consciousness: awake and alert Pain management: pain level controlled Vital Signs Assessment: post-procedure vital signs reviewed and stable Respiratory status: spontaneous breathing, nonlabored ventilation, respiratory function stable and patient connected to nasal cannula oxygen Cardiovascular status: blood pressure returned to baseline and stable Postop Assessment: no apparent nausea or vomiting Anesthetic complications: no    Last Vitals:  Vitals:   03/24/17 1130 03/24/17 1227  BP: 131/80 126/78  Pulse: 63 74  Resp: 10 16  Temp:  36.7 C  SpO2: 98% 94%    Last Pain:  Vitals:   03/24/17 0824  TempSrc: Oral                 [name]

## 2017-03-31 ENCOUNTER — Encounter (HOSPITAL_BASED_OUTPATIENT_CLINIC_OR_DEPARTMENT_OTHER): Payer: Self-pay | Admitting: Urology

## 2017-04-01 DIAGNOSIS — F322 Major depressive disorder, single episode, severe without psychotic features: Secondary | ICD-10-CM | POA: Diagnosis not present

## 2017-04-07 DIAGNOSIS — N3010 Interstitial cystitis (chronic) without hematuria: Secondary | ICD-10-CM | POA: Diagnosis not present

## 2017-04-30 DIAGNOSIS — J069 Acute upper respiratory infection, unspecified: Secondary | ICD-10-CM | POA: Diagnosis not present

## 2017-04-30 DIAGNOSIS — J020 Streptococcal pharyngitis: Secondary | ICD-10-CM | POA: Diagnosis not present

## 2017-05-10 DIAGNOSIS — R102 Pelvic and perineal pain: Secondary | ICD-10-CM | POA: Diagnosis not present

## 2017-05-10 DIAGNOSIS — N3946 Mixed incontinence: Secondary | ICD-10-CM | POA: Diagnosis not present

## 2017-05-10 DIAGNOSIS — M62838 Other muscle spasm: Secondary | ICD-10-CM | POA: Diagnosis not present

## 2017-05-10 DIAGNOSIS — M6281 Muscle weakness (generalized): Secondary | ICD-10-CM | POA: Diagnosis not present

## 2017-05-12 DIAGNOSIS — J069 Acute upper respiratory infection, unspecified: Secondary | ICD-10-CM | POA: Diagnosis not present

## 2017-05-20 ENCOUNTER — Emergency Department (HOSPITAL_COMMUNITY)
Admission: EM | Admit: 2017-05-20 | Discharge: 2017-05-20 | Disposition: A | Discharge status: Home / Self Care | Payer: BLUE CROSS/BLUE SHIELD | Attending: Emergency Medicine | Admitting: Emergency Medicine

## 2017-05-20 ENCOUNTER — Encounter (HOSPITAL_COMMUNITY): Payer: Self-pay

## 2017-05-20 ENCOUNTER — Other Ambulatory Visit: Payer: Self-pay

## 2017-05-20 DIAGNOSIS — R3989 Other symptoms and signs involving the genitourinary system: Secondary | ICD-10-CM | POA: Insufficient documentation

## 2017-05-20 DIAGNOSIS — Z5321 Procedure and treatment not carried out due to patient leaving prior to being seen by health care provider: Secondary | ICD-10-CM | POA: Insufficient documentation

## 2017-05-20 HISTORY — DX: Interstitial cystitis (chronic) without hematuria: N30.10

## 2017-05-20 LAB — URINALYSIS, ROUTINE W REFLEX MICROSCOPIC
BACTERIA UA: NONE SEEN
BILIRUBIN URINE: NEGATIVE
Glucose, UA: NEGATIVE mg/dL
HGB URINE DIPSTICK: NEGATIVE
Ketones, ur: NEGATIVE mg/dL
LEUKOCYTES UA: NEGATIVE
NITRITE: POSITIVE — AB
PROTEIN: NEGATIVE mg/dL
RBC / HPF: NONE SEEN RBC/hpf (ref 0–5)
SPECIFIC GRAVITY, URINE: 1.005 (ref 1.005–1.030)
pH: 6 (ref 5.0–8.0)

## 2017-05-20 LAB — BASIC METABOLIC PANEL
ANION GAP: 6 (ref 5–15)
BUN: 8 mg/dL (ref 6–20)
CHLORIDE: 105 mmol/L (ref 101–111)
CO2: 27 mmol/L (ref 22–32)
Calcium: 9.1 mg/dL (ref 8.9–10.3)
Creatinine, Ser: 0.73 mg/dL (ref 0.44–1.00)
Glucose, Bld: 92 mg/dL (ref 65–99)
POTASSIUM: 3.9 mmol/L (ref 3.5–5.1)
SODIUM: 138 mmol/L (ref 135–145)

## 2017-05-20 LAB — CBC
HEMATOCRIT: 41.4 % (ref 36.0–46.0)
HEMOGLOBIN: 13.9 g/dL (ref 12.0–15.0)
MCH: 30.2 pg (ref 26.0–34.0)
MCHC: 33.6 g/dL (ref 30.0–36.0)
MCV: 90 fL (ref 78.0–100.0)
Platelets: 298 10*3/uL (ref 150–400)
RBC: 4.6 MIL/uL (ref 3.87–5.11)
RDW: 13.1 % (ref 11.5–15.5)
WBC: 8 10*3/uL (ref 4.0–10.5)

## 2017-05-20 NOTE — ED Notes (Signed)
Patient states that she can't wait any longer, she was going home.  Advised to stay, she stated she was having pain just sitting.  Patient leaving.

## 2017-05-20 NOTE — ED Triage Notes (Signed)
Pt endorses bladder pain that began this morning, pt has hx of interstitial cystitis and this feels like the same. Has been using the bathroom normal but having pain with urination. VSS.

## 2017-05-23 DIAGNOSIS — N3010 Interstitial cystitis (chronic) without hematuria: Secondary | ICD-10-CM | POA: Diagnosis not present

## 2017-05-23 DIAGNOSIS — R300 Dysuria: Secondary | ICD-10-CM | POA: Diagnosis not present

## 2017-06-21 DIAGNOSIS — J069 Acute upper respiratory infection, unspecified: Secondary | ICD-10-CM | POA: Diagnosis not present

## 2017-06-21 DIAGNOSIS — H6691 Otitis media, unspecified, right ear: Secondary | ICD-10-CM | POA: Diagnosis not present

## 2017-07-04 DIAGNOSIS — H6590 Unspecified nonsuppurative otitis media, unspecified ear: Secondary | ICD-10-CM | POA: Diagnosis not present

## 2017-07-04 DIAGNOSIS — M6248 Contracture of muscle, other site: Secondary | ICD-10-CM | POA: Diagnosis not present

## 2017-10-04 DIAGNOSIS — Z0000 Encounter for general adult medical examination without abnormal findings: Secondary | ICD-10-CM | POA: Diagnosis not present

## 2017-10-04 DIAGNOSIS — Z13220 Encounter for screening for lipoid disorders: Secondary | ICD-10-CM | POA: Diagnosis not present

## 2017-10-04 DIAGNOSIS — Z131 Encounter for screening for diabetes mellitus: Secondary | ICD-10-CM | POA: Diagnosis not present

## 2017-10-04 DIAGNOSIS — F322 Major depressive disorder, single episode, severe without psychotic features: Secondary | ICD-10-CM | POA: Diagnosis not present

## 2017-10-07 DIAGNOSIS — M542 Cervicalgia: Secondary | ICD-10-CM | POA: Diagnosis not present

## 2017-10-07 DIAGNOSIS — Z9109 Other allergy status, other than to drugs and biological substances: Secondary | ICD-10-CM | POA: Insufficient documentation

## 2017-10-07 DIAGNOSIS — R51 Headache: Secondary | ICD-10-CM | POA: Diagnosis not present

## 2017-10-07 DIAGNOSIS — Z6833 Body mass index (BMI) 33.0-33.9, adult: Secondary | ICD-10-CM | POA: Diagnosis not present

## 2017-10-07 DIAGNOSIS — H6691 Otitis media, unspecified, right ear: Secondary | ICD-10-CM | POA: Diagnosis not present

## 2017-10-07 DIAGNOSIS — R030 Elevated blood-pressure reading, without diagnosis of hypertension: Secondary | ICD-10-CM | POA: Diagnosis not present

## 2017-10-10 DIAGNOSIS — H9042 Sensorineural hearing loss, unilateral, left ear, with unrestricted hearing on the contralateral side: Secondary | ICD-10-CM | POA: Diagnosis not present

## 2017-10-10 DIAGNOSIS — R42 Dizziness and giddiness: Secondary | ICD-10-CM | POA: Diagnosis not present

## 2017-10-10 DIAGNOSIS — H9203 Otalgia, bilateral: Secondary | ICD-10-CM | POA: Diagnosis not present

## 2017-10-10 DIAGNOSIS — G44201 Tension-type headache, unspecified, intractable: Secondary | ICD-10-CM | POA: Diagnosis not present

## 2017-10-10 DIAGNOSIS — J300 Vasomotor rhinitis: Secondary | ICD-10-CM | POA: Diagnosis not present

## 2017-10-10 DIAGNOSIS — H9312 Tinnitus, left ear: Secondary | ICD-10-CM | POA: Diagnosis not present

## 2017-10-21 ENCOUNTER — Other Ambulatory Visit: Payer: Self-pay | Admitting: Family Medicine

## 2017-10-21 DIAGNOSIS — Z1231 Encounter for screening mammogram for malignant neoplasm of breast: Secondary | ICD-10-CM

## 2017-11-11 ENCOUNTER — Ambulatory Visit
Admission: RE | Admit: 2017-11-11 | Discharge: 2017-11-11 | Disposition: A | Discharge status: Home / Self Care | Payer: BLUE CROSS/BLUE SHIELD | Source: Ambulatory Visit | Attending: Family Medicine | Admitting: Family Medicine

## 2017-11-11 ENCOUNTER — Ambulatory Visit: Payer: Self-pay

## 2017-11-11 DIAGNOSIS — Z1231 Encounter for screening mammogram for malignant neoplasm of breast: Secondary | ICD-10-CM

## 2018-02-23 DIAGNOSIS — R42 Dizziness and giddiness: Secondary | ICD-10-CM | POA: Diagnosis not present

## 2018-02-23 DIAGNOSIS — H538 Other visual disturbances: Secondary | ICD-10-CM | POA: Diagnosis not present

## 2018-02-23 DIAGNOSIS — H9312 Tinnitus, left ear: Secondary | ICD-10-CM | POA: Diagnosis not present

## 2018-02-27 DIAGNOSIS — H8112 Benign paroxysmal vertigo, left ear: Secondary | ICD-10-CM | POA: Diagnosis not present

## 2018-02-27 DIAGNOSIS — R42 Dizziness and giddiness: Secondary | ICD-10-CM | POA: Diagnosis not present

## 2018-02-27 DIAGNOSIS — M2560 Stiffness of unspecified joint, not elsewhere classified: Secondary | ICD-10-CM | POA: Diagnosis not present

## 2018-02-27 DIAGNOSIS — R262 Difficulty in walking, not elsewhere classified: Secondary | ICD-10-CM | POA: Diagnosis not present

## 2018-03-01 ENCOUNTER — Other Ambulatory Visit: Payer: Self-pay | Admitting: Nurse Practitioner

## 2018-03-01 DIAGNOSIS — H9312 Tinnitus, left ear: Secondary | ICD-10-CM

## 2018-03-01 DIAGNOSIS — R42 Dizziness and giddiness: Secondary | ICD-10-CM | POA: Diagnosis not present

## 2018-03-01 DIAGNOSIS — H8112 Benign paroxysmal vertigo, left ear: Secondary | ICD-10-CM | POA: Diagnosis not present

## 2018-03-01 DIAGNOSIS — M2560 Stiffness of unspecified joint, not elsewhere classified: Secondary | ICD-10-CM | POA: Diagnosis not present

## 2018-03-01 DIAGNOSIS — R262 Difficulty in walking, not elsewhere classified: Secondary | ICD-10-CM | POA: Diagnosis not present

## 2018-03-03 ENCOUNTER — Emergency Department (HOSPITAL_COMMUNITY)
Admission: EM | Admit: 2018-03-03 | Discharge: 2018-03-03 | Disposition: A | Discharge status: Home / Self Care | Payer: BLUE CROSS/BLUE SHIELD | Attending: Emergency Medicine | Admitting: Emergency Medicine

## 2018-03-03 ENCOUNTER — Other Ambulatory Visit: Payer: Self-pay

## 2018-03-03 ENCOUNTER — Encounter (HOSPITAL_COMMUNITY): Payer: Self-pay

## 2018-03-03 ENCOUNTER — Emergency Department (HOSPITAL_COMMUNITY): Payer: BLUE CROSS/BLUE SHIELD

## 2018-03-03 DIAGNOSIS — R42 Dizziness and giddiness: Secondary | ICD-10-CM | POA: Insufficient documentation

## 2018-03-03 DIAGNOSIS — Z79899 Other long term (current) drug therapy: Secondary | ICD-10-CM | POA: Insufficient documentation

## 2018-03-03 LAB — URINALYSIS, ROUTINE W REFLEX MICROSCOPIC
Bilirubin Urine: NEGATIVE
Glucose, UA: NEGATIVE mg/dL
Hgb urine dipstick: NEGATIVE
KETONES UR: NEGATIVE mg/dL
Leukocytes, UA: NEGATIVE
NITRITE: NEGATIVE
PH: 6 (ref 5.0–8.0)
Protein, ur: NEGATIVE mg/dL
SPECIFIC GRAVITY, URINE: 1.02 (ref 1.005–1.030)

## 2018-03-03 LAB — BASIC METABOLIC PANEL
ANION GAP: 11 (ref 5–15)
BUN: 19 mg/dL (ref 6–20)
CO2: 30 mmol/L (ref 22–32)
Calcium: 9 mg/dL (ref 8.9–10.3)
Chloride: 102 mmol/L (ref 98–111)
Creatinine, Ser: 0.78 mg/dL (ref 0.44–1.00)
GFR calc Af Amer: >60 mL/min (ref >60)
Glucose, Bld: 95 mg/dL (ref 70–99)
POTASSIUM: 3.7 mmol/L (ref 3.5–5.1)
SODIUM: 143 mmol/L (ref 135–145)

## 2018-03-03 LAB — CBC
HEMATOCRIT: 46.4 % — AB (ref 36.0–46.0)
HEMOGLOBIN: 15.9 g/dL — AB (ref 12.0–15.0)
MCH: 30.5 pg (ref 26.0–34.0)
MCHC: 34.3 g/dL (ref 30.0–36.0)
MCV: 88.9 fL (ref 78.0–100.0)
Platelets: 356 10*3/uL (ref 150–400)
RBC: 5.22 MIL/uL — ABNORMAL HIGH (ref 3.87–5.11)
RDW: 13.3 % (ref 11.5–15.5)
WBC: 10.7 10*3/uL — AB (ref 4.0–10.5)

## 2018-03-03 LAB — I-STAT BETA HCG BLOOD, ED (MC, WL, AP ONLY): I-stat hCG, quantitative: <5 m[IU]/mL (ref <5)

## 2018-03-03 LAB — CBG MONITORING, ED: Glucose-Capillary: 95 mg/dL (ref 70–99)

## 2018-03-03 MED ORDER — MECLIZINE HCL 12.5 MG PO TABS: 12.5000 mg | ORAL_TABLET | Freq: Three times a day (TID) | ORAL | 0 refills | Status: DC | PRN

## 2018-03-03 MED ORDER — ONDANSETRON HCL 4 MG PO TABS: 4.0000 mg | ORAL_TABLET | Freq: Four times a day (QID) | ORAL | 0 refills | Status: DC

## 2018-03-03 MED ORDER — ONDANSETRON HCL 4 MG/2ML IJ SOLN
4.0000 mg | Freq: Once | INTRAMUSCULAR | Status: AC
  Administered 2018-03-03: 4 mg via INTRAVENOUS
  Filled 2018-03-03: qty 2

## 2018-03-03 MED ORDER — SODIUM CHLORIDE 0.9 % IV BOLUS
1000.0000 mL | Freq: Once | INTRAVENOUS | Status: AC
  Administered 2018-03-03: 1000 mL via INTRAVENOUS

## 2018-03-03 MED ORDER — MECLIZINE HCL 25 MG PO TABS
25.0000 mg | ORAL_TABLET | Freq: Once | ORAL | Status: AC
  Administered 2018-03-03: 25 mg via ORAL
  Filled 2018-03-03: qty 1

## 2018-03-03 NOTE — ED Provider Notes (Signed)
[facility]-EMERGENCY DEPT Provider Note   CSN: [id number] Arrival date & time: 03/03/18  1121     History   Chief Complaint Chief Complaint  Patient presents with  . Dizziness    HPI Maria Blackburn is a 47 y.o. female.  47 year old female with prior medical history as detailed below presents with complaint of dizziness/weakness. Symptoms have been ongoing for the last 2 weeks. She was diagnosed with vertigo about 2 weeks prior and started on a prednisone taper. She complains now of persistent symptoms. She denies associated headache, vision change, focal weakness, chest pain, shortness of breath, or other acute complaint.   The history is provided by the patient and medical records.  Dizziness  Quality:  Lightheadedness, vertigo, head spinning, imbalance and room spinning Severity:  Mild Onset quality:  Gradual Duration:  2 weeks Timing:  Intermittent Progression:  Waxing and waning Chronicity:  New Relieved by:  Nothing Worsened by:  Nothing Ineffective treatments: prednisone.   Past Medical History:  Diagnosis Date  . Anxiety   . Bladder spasms   . Frequency of urination   . GERD (gastroesophageal reflux disease)   . Interstitial cystitis   . Pelvic pain   . SUI (stress urinary incontinence, female)   . Urgency of urination     There are no active problems to display for this patient.   Past Surgical History:  Procedure Laterality Date  . ABDOMINAL HYSTERECTOMY  2000   ovaries remain  . CYSTO WITH HYDRODISTENSION N/A 03/24/2017   Procedure: CYSTOSCOPY/HYDRODISTENSION INSTILLATION OF MARCAINE AND PYRIDIUM;  Surgeon: Bjorn PippinWrenn, John, MD;  [facility];  Service: Urology;  Laterality: N/A;  . HYSTEROSCOPY W/D&C  1999   POLYPECTOMY  . TUBAL LIGATION Bilateral 1996     OB History   None      Home Medications    Prior to Admission medications   Medication Sig Start Date End Date Taking? Authorizing Provider    aspirin-acetaminophen-caffeine (EXCEDRIN MIGRAINE) [phone] MG tablet Take 2 tablets every 6 (six) hours as needed by mouth for headache.   Yes [provider]  hydrOXYzine (ATARAX/VISTARIL) 10 MG tablet Take 10 mg at bedtime by mouth. 04/07/17  Yes [provider]  ibuprofen (ADVIL,MOTRIN) 200 MG tablet Take 400-800 mg by mouth every 6 (six) hours as needed for headache.    Yes [provider]  loratadine (CLARITIN) 10 MG tablet Take 10 mg by mouth daily as needed for allergies.   Yes [provider]  sertraline (ZOLOFT) 100 MG tablet Take 150 mg by mouth at bedtime.    Yes [provider]  HYDROcodone-acetaminophen (NORCO) 5-325 MG tablet Take 1 tablet by mouth every 6 (six) hours as needed for moderate pain. Patient not taking: Reported on 03/03/2018 03/24/17   Bjorn PippinWrenn, John, MD  phenazopyridine (PYRIDIUM) 200 MG tablet Take 1 tablet (200 mg total) by mouth 3 (three) times daily as needed for pain. Patient not taking: Reported on 03/03/2018 03/24/17   Bjorn PippinWrenn, John, MD    Family History Family History  Problem Relation Age of Onset  . Heart disease Mother   . Diabetes Son     Social History Social History   Tobacco Use  . Smoking status: Never Smoker  . Smokeless tobacco: Never Used  Substance Use Topics  . Alcohol use: No  . Drug use: No     Allergies   Patient has no known allergies.   Review of Systems Review of Systems  Neurological: Positive for dizziness.  All other systems reviewed and are negative.    Physical Exam Updated Vital Signs BP (!) 148/97 (BP Location: Right Arm)   Pulse 74   Temp (!) 97.5 F (36.4 C) (Oral)   Resp 16   Ht 5\' 7"  (1.702 m)   Wt 95.3 kg   SpO2 97%   BMI 32.89 kg/m   Physical Exam  Constitutional: She is oriented to person, place, and time. She appears well-developed and well-nourished. No distress.  HENT:  Head: Normocephalic and atraumatic.  Mouth/Throat: Oropharynx is clear and  moist.  Eyes: Pupils are equal, round, and reactive to light. Conjunctivae and EOM are normal.  Neck: Normal range of motion. Neck supple.  Cardiovascular: Normal rate, regular rhythm and normal heart sounds.  Pulmonary/Chest: Effort normal and breath sounds normal. No respiratory distress.  Abdominal: Soft. She exhibits no distension. There is no tenderness.  Musculoskeletal: Normal range of motion. She exhibits no edema or deformity.  Neurological: She is alert and oriented to person, place, and time.  AOx4 Normal speech No facial droop VAN negative  5/5 strength in BUE/BLE  Skin: Skin is warm and dry.  Psychiatric: She has a normal mood and affect.  Nursing note and vitals reviewed.    ED Treatments / Results  Labs (all labs ordered are listed, but only abnormal results are displayed) Labs Reviewed  CBC - Abnormal; Notable for the following components:      Result Value   WBC 10.7 (*)    RBC 5.22 (*)    Hemoglobin 15.9 (*)    HCT 46.4 (*)    All other components within normal limits  BASIC METABOLIC PANEL  URINALYSIS, ROUTINE W REFLEX MICROSCOPIC  CBG MONITORING, ED  CBG MONITORING, ED  I-STAT BETA HCG BLOOD, ED ([facility], [facility], [facility] ONLY)    EKG EKG Interpretation  Date/Time:  Friday March 03 2018 12:17:10 EDT Ventricular Rate:  61 PR Interval:    QRS Duration: 89 QT Interval:  399 QTC Calculation: 402 R Axis:   36 Text Interpretation:  Sinus rhythm Borderline short PR interval Consider left atrial enlargement Confirmed by Kristine Royal [id number]) on 03/03/2018 12:33:37 PM   Radiology Ct Head Wo Contrast  Result Date: 03/03/2018 CLINICAL DATA:  Peripheral vertigo EXAM: CT HEAD WITHOUT CONTRAST TECHNIQUE: Contiguous axial images were obtained from the base of the skull through the vertex without intravenous contrast. COMPARISON:  02/04/2011 FINDINGS: Brain: No evidence of acute infarction, hemorrhage, hydrocephalus, extra-axial collection or mass lesion/mass effect.  Vascular: No hyperdense vessel or unexpected calcification. Skull: Negative Sinuses/Orbits: Negative Other: None IMPRESSION: Negative CT head Electronically Signed   By: Marlan Palau M.D.   On: 03/03/2018 13:01    Procedures Procedures (including critical care time)  Medications Ordered in ED Medications  sodium chloride 0.9 % bolus 1,000 mL (1,000 mLs Intravenous New Bag/Given 03/03/18 1216)  meclizine (ANTIVERT) tablet 25 mg (25 mg Oral Given 03/03/18 1219)  ondansetron (ZOFRAN) injection 4 mg (4 mg Intravenous Given 03/03/18 1219)     Initial Impression / Assessment and Plan / ED Course  I have reviewed the triage vital signs and the nursing notes.  Pertinent labs & imaging results that were available during my care of the patient were reviewed by me and considered in my medical decision making (see chart for details).    MDM  Screen complete  Patient is presenting for evaluation of possible vertigo.  Exam and history are consistent with same.  Screening labs  and CT scan do not redemonstrate other significant abnormality.    Patient feels improved following ED treatment.  She desires discharge home.    Importance of close follow-up is stressed.  Strict return precautions given and understood.   Final Clinical Impressions(s) / ED Diagnoses   Final diagnoses:  Vertigo    ED Discharge Orders         Ordered    meclizine (ANTIVERT) 12.5 MG tablet  3 times daily PRN     03/03/18 1434    ondansetron (ZOFRAN) 4 MG tablet  Every 6 hours     03/03/18 1434           Wynetta Fines, MD 03/03/18 1435

## 2018-03-03 NOTE — ED Notes (Signed)
Patient states " I feel slightly less dizzy now".

## 2018-03-03 NOTE — ED Triage Notes (Addendum)
Pt states she has been experiencing vertigo since 8/15 and has been progressively getting worse. Pt states this is the first time she has experienced this. Pt states she was seen on 8/22 at [facility] and was given prednisone for her "balance issues". [facility], whom stated she was dehydrated.

## 2018-03-03 NOTE — ED Triage Notes (Signed)
Patient here from home with complaints of vertigo x2 weeks. Getting worse dispite prednisone tapering dose. [facility].

## 2018-03-03 NOTE — ED Notes (Signed)
Pt assisted onto bedpan.

## 2018-03-03 NOTE — Discharge Instructions (Addendum)
Please return for any problem.  Follow-up with your regular doctor as instructed. °

## 2018-03-04 ENCOUNTER — Emergency Department (HOSPITAL_COMMUNITY)
Admission: EM | Admit: 2018-03-04 | Discharge: 2018-03-04 | Disposition: A | Discharge status: Home / Self Care | Payer: BLUE CROSS/BLUE SHIELD | Attending: Emergency Medicine | Admitting: Emergency Medicine

## 2018-03-04 ENCOUNTER — Emergency Department (HOSPITAL_COMMUNITY): Payer: BLUE CROSS/BLUE SHIELD

## 2018-03-04 ENCOUNTER — Other Ambulatory Visit: Payer: Self-pay

## 2018-03-04 DIAGNOSIS — R0902 Hypoxemia: Secondary | ICD-10-CM | POA: Diagnosis not present

## 2018-03-04 DIAGNOSIS — Z79899 Other long term (current) drug therapy: Secondary | ICD-10-CM | POA: Insufficient documentation

## 2018-03-04 DIAGNOSIS — R1030 Lower abdominal pain, unspecified: Secondary | ICD-10-CM | POA: Diagnosis not present

## 2018-03-04 DIAGNOSIS — R42 Dizziness and giddiness: Secondary | ICD-10-CM | POA: Diagnosis not present

## 2018-03-04 DIAGNOSIS — R1084 Generalized abdominal pain: Secondary | ICD-10-CM | POA: Diagnosis not present

## 2018-03-04 DIAGNOSIS — K2950 Unspecified chronic gastritis without bleeding: Secondary | ICD-10-CM | POA: Diagnosis not present

## 2018-03-04 DIAGNOSIS — R1013 Epigastric pain: Secondary | ICD-10-CM | POA: Diagnosis not present

## 2018-03-04 DIAGNOSIS — R5383 Other fatigue: Secondary | ICD-10-CM | POA: Diagnosis not present

## 2018-03-04 DIAGNOSIS — W19XXXA Unspecified fall, initial encounter: Secondary | ICD-10-CM | POA: Diagnosis not present

## 2018-03-04 DIAGNOSIS — R110 Nausea: Secondary | ICD-10-CM | POA: Diagnosis not present

## 2018-03-04 DIAGNOSIS — K2970 Gastritis, unspecified, without bleeding: Secondary | ICD-10-CM

## 2018-03-04 LAB — URINALYSIS, ROUTINE W REFLEX MICROSCOPIC
BILIRUBIN URINE: NEGATIVE
GLUCOSE, UA: NEGATIVE mg/dL
HGB URINE DIPSTICK: NEGATIVE
KETONES UR: NEGATIVE mg/dL
LEUKOCYTES UA: NEGATIVE
Nitrite: NEGATIVE
PH: 7 (ref 5.0–8.0)
PROTEIN: NEGATIVE mg/dL
Specific Gravity, Urine: 1.019 (ref 1.005–1.030)

## 2018-03-04 LAB — COMPREHENSIVE METABOLIC PANEL
ALT: 16 U/L (ref 0–44)
AST: 17 U/L (ref 15–41)
Albumin: 3.9 g/dL (ref 3.5–5.0)
Alkaline Phosphatase: 45 U/L (ref 38–126)
Anion gap: 8 (ref 5–15)
BUN: 17 mg/dL (ref 6–20)
CALCIUM: 9.1 mg/dL (ref 8.9–10.3)
CHLORIDE: 105 mmol/L (ref 98–111)
CO2: 29 mmol/L (ref 22–32)
CREATININE: 0.99 mg/dL (ref 0.44–1.00)
GFR calc Af Amer: >60 mL/min (ref >60)
Glucose, Bld: 111 mg/dL — ABNORMAL HIGH (ref 70–99)
Potassium: 3.7 mmol/L (ref 3.5–5.1)
Sodium: 142 mmol/L (ref 135–145)
Total Bilirubin: 1.2 mg/dL (ref 0.3–1.2)
Total Protein: 6.3 g/dL — ABNORMAL LOW (ref 6.5–8.1)

## 2018-03-04 LAB — CBC WITH DIFFERENTIAL/PLATELET
Basophils Absolute: 0 10*3/uL (ref 0.0–0.1)
Basophils Relative: 0 %
EOS ABS: 0.3 10*3/uL (ref 0.0–0.7)
Eosinophils Relative: 2 %
HCT: 44 % (ref 36.0–46.0)
Hemoglobin: 15 g/dL (ref 12.0–15.0)
LYMPHS ABS: 3.3 10*3/uL (ref 0.7–4.0)
Lymphocytes Relative: 25 %
MCH: 30.5 pg (ref 26.0–34.0)
MCHC: 34.1 g/dL (ref 30.0–36.0)
MCV: 89.4 fL (ref 78.0–100.0)
MONOS PCT: 6 %
Monocytes Absolute: 0.7 10*3/uL (ref 0.1–1.0)
Neutro Abs: 8.7 10*3/uL — ABNORMAL HIGH (ref 1.7–7.7)
Neutrophils Relative %: 67 %
PLATELETS: 351 10*3/uL (ref 150–400)
RBC: 4.92 MIL/uL (ref 3.87–5.11)
RDW: 13.4 % (ref 11.5–15.5)
WBC: 13 10*3/uL — AB (ref 4.0–10.5)

## 2018-03-04 LAB — LIPASE, BLOOD: LIPASE: 32 U/L (ref 11–51)

## 2018-03-04 MED ORDER — KETOROLAC TROMETHAMINE 30 MG/ML IJ SOLN
30.0000 mg | Freq: Once | INTRAMUSCULAR | Status: AC
  Administered 2018-03-04: 30 mg via INTRAVENOUS
  Filled 2018-03-04: qty 1

## 2018-03-04 MED ORDER — IOPAMIDOL (ISOVUE-300) INJECTION 61%
100.0000 mL | Freq: Once | INTRAVENOUS | Status: AC | PRN
  Administered 2018-03-04: 100 mL via INTRAVENOUS

## 2018-03-04 MED ORDER — FAMOTIDINE IN NACL 20-0.9 MG/50ML-% IV SOLN
20.0000 mg | Freq: Once | INTRAVENOUS | Status: AC
  Administered 2018-03-04: 20 mg via INTRAVENOUS
  Filled 2018-03-04: qty 50

## 2018-03-04 MED ORDER — SODIUM CHLORIDE 0.9 % IV BOLUS
1000.0000 mL | Freq: Once | INTRAVENOUS | Status: AC
  Administered 2018-03-04: 1000 mL via INTRAVENOUS

## 2018-03-04 MED ORDER — IOPAMIDOL (ISOVUE-300) INJECTION 61%
INTRAVENOUS | Status: AC
  Administered 2018-03-04: 100 mL via INTRAVENOUS
  Filled 2018-03-04: qty 100

## 2018-03-04 MED ORDER — FAMOTIDINE 20 MG PO TABS: 20.0000 mg | ORAL_TABLET | Freq: Two times a day (BID) | ORAL | 0 refills | Status: DC

## 2018-03-04 MED ORDER — ONDANSETRON 4 MG PO TBDP: 4.0000 mg | ORAL_TABLET | Freq: Three times a day (TID) | ORAL | 0 refills | Status: DC | PRN

## 2018-03-04 NOTE — ED Notes (Signed)
Pt c/o nausea and epigastric pain / heartburn. Pt states she is weak.

## 2018-03-04 NOTE — ED Notes (Signed)
Urine sample was obtained from Maria Blackburn

## 2018-03-04 NOTE — ED Provider Notes (Signed)
[facility]-EMERGENCY DEPT Provider Note   CSN: [id number] Arrival date & time: 03/04/18  1702     History   Chief Complaint Chief Complaint  Patient presents with  . Dizziness  . Abdominal Pain    HPI [name] is a 47 y.o. female with a past medical history of interstitial cystitis, GERD, who presents to ED for evaluation of 3-hour history of nausea, epigastric abdominal discomfort, lower abdominal pain.  She states that she ate a meal from [facility] prior to symptom onset.  She went to urinate in the bathroom and then began having sudden onset of her abdominal pain.  She reports feeling weak.  Patient was seen and evaluated on 8/30 for dizziness, diagnosed with vertigo and was given meclizine.  She states that this is helped with her dizziness.  Reports some dysuria but states that this is typical of her interstitial cystitis.  Denies any vaginal complaints, sick contacts with similar symptoms, recent travel.  She has not taken any medicine to help with her nausea or pain.  She denies chronic NSAID use.  Denies any alcohol, tobacco or other drug use.  Prior abdominal surgeries include hysterectomy and tubal ligation.  HPI  Past Medical History:  Diagnosis Date  . Anxiety   . Bladder spasms   . Frequency of urination   . GERD (gastroesophageal reflux disease)   . Interstitial cystitis   . Pelvic pain   . SUI (stress urinary incontinence, female)   . Urgency of urination     There are no active problems to display for this patient.   Past Surgical History:  Procedure Laterality Date  . ABDOMINAL HYSTERECTOMY  2000   ovaries remain  . CYSTO WITH HYDRODISTENSION N/A 03/24/2017   Procedure: CYSTOSCOPY/HYDRODISTENSION INSTILLATION OF MARCAINE AND PYRIDIUM;  Surgeon: Bjorn Pippin, MD;  Location: [facility];  Service: Urology;  Laterality: N/A;  . HYSTEROSCOPY W/D&C  1999   POLYPECTOMY  . TUBAL LIGATION Bilateral 1996     OB History     None      Home Medications    Prior to Admission medications   Medication Sig Start Date End Date Taking? Authorizing Provider  aspirin-acetaminophen-caffeine (EXCEDRIN MIGRAINE) [phone] MG tablet Take 2 tablets every 6 (six) hours as needed by mouth for headache.   Yes [provider]  hydrOXYzine (ATARAX/VISTARIL) 10 MG tablet Take 10 mg at bedtime by mouth. 04/07/17  Yes [provider]  ibuprofen (ADVIL,MOTRIN) 200 MG tablet Take 400-800 mg by mouth every 6 (six) hours as needed for headache.    Yes [provider]  loratadine (CLARITIN) 10 MG tablet Take 10 mg by mouth daily as needed for allergies.   Yes [provider]  meclizine (ANTIVERT) 12.5 MG tablet Take 1 tablet (12.5 mg total) by mouth 3 (three) times daily as needed for dizziness. 03/03/18  Yes Wynetta Fines, MD  naproxen sodium (ALEVE) 220 MG tablet Take 440 mg by mouth 2 (two) times daily as needed (pain).   Yes [provider]  ondansetron (ZOFRAN) 4 MG tablet Take 1 tablet (4 mg total) by mouth every 6 (six) hours. Patient taking differently: Take 4 mg by mouth every 6 (six) hours as needed for nausea.  03/03/18  Yes Wynetta Fines, MD  sertraline (ZOLOFT) 100 MG tablet Take 150 mg by mouth at bedtime.    Yes [provider]  famotidine (PEPCID) 20 MG tablet Take 1 tablet (20 mg total) by mouth  2 (two) times daily. 03/04/18   Zyann Mabry, PA-C  HYDROcodone-acetaminophen (NORCO) 5-325 MG tablet Take 1 tablet by mouth every 6 (six) hours as needed for moderate pain. Patient not taking: Reported on 03/03/2018 03/24/17   Bjorn Pippin, MD  ondansetron (ZOFRAN ODT) 4 MG disintegrating tablet Take 1 tablet (4 mg total) by mouth every 8 (eight) hours as needed for nausea or vomiting. 03/04/18   Kadisha Goodine, PA-C  phenazopyridine (PYRIDIUM) 200 MG tablet Take 1 tablet (200 mg total) by mouth 3 (three) times daily as needed for pain. Patient not taking: Reported on 03/03/2018  03/24/17   Bjorn Pippin, MD    Family History Family History  Problem Relation Age of Onset  . Heart disease Mother   . Diabetes Son     Social History Social History   Tobacco Use  . Smoking status: Never Smoker  . Smokeless tobacco: Never Used  Substance Use Topics  . Alcohol use: No  . Drug use: No     Allergies   Patient has no known allergies.   Review of Systems Review of Systems  Constitutional: Positive for fatigue. Negative for appetite change, chills and fever.  HENT: Negative for ear pain, rhinorrhea, sneezing and sore throat.   Eyes: Negative for photophobia and visual disturbance.  Respiratory: Negative for cough, chest tightness, shortness of breath and wheezing.   Cardiovascular: Negative for chest pain and palpitations.  Gastrointestinal: Positive for abdominal pain and nausea. Negative for blood in stool, constipation, diarrhea and vomiting.  Genitourinary: Negative for dysuria, hematuria and urgency.  Musculoskeletal: Negative for myalgias.  Skin: Negative for rash.  Neurological: Negative for dizziness, weakness and light-headedness.     Physical Exam Updated Vital Signs BP 124/69   Pulse 68   Temp 98.2 F (36.8 C) (Oral)   Resp 16   Ht 5\' 7"  (1.702 m)   Wt 90.7 kg   SpO2 92%   BMI 31.32 kg/m   Physical Exam  Constitutional: She appears well-developed and well-nourished. No distress.  HENT:  Head: Normocephalic and atraumatic.  Nose: Nose normal.  Eyes: Conjunctivae and EOM are normal. Left eye exhibits no discharge. No scleral icterus.  Neck: Normal range of motion. Neck supple.  Cardiovascular: Normal rate, regular rhythm, normal heart sounds and intact distal pulses. Exam reveals no gallop and no friction rub.  No murmur heard. Pulmonary/Chest: Effort normal and breath sounds normal. No respiratory distress.  Abdominal: Soft. Bowel sounds are normal. She exhibits no distension. There is generalized tenderness. There is no guarding.    Musculoskeletal: Normal range of motion. She exhibits no edema.  Neurological: She is alert. She exhibits normal muscle tone. Coordination normal.  Skin: Skin is warm and dry. No rash noted.  Psychiatric: She has a normal mood and affect.  Nursing note and vitals reviewed.    ED Treatments / Results  Labs (all labs ordered are listed, but only abnormal results are displayed) Labs Reviewed  COMPREHENSIVE METABOLIC PANEL - Abnormal; Notable for the following components:      Result Value   Glucose, Bld 111 (*)    Total Protein 6.3 (*)    All other components within normal limits  CBC WITH DIFFERENTIAL/PLATELET - Abnormal; Notable for the following components:   WBC 13.0 (*)    Neutro Abs 8.7 (*)    All other components within normal limits  URINALYSIS, ROUTINE W REFLEX MICROSCOPIC - Abnormal; Notable for the following components:   Color, Urine STRAW (*)  All other components within normal limits  LIPASE, BLOOD    EKG None  Radiology Ct Head Wo Contrast  Result Date: 03/03/2018 CLINICAL DATA:  Peripheral vertigo EXAM: CT HEAD WITHOUT CONTRAST TECHNIQUE: Contiguous axial images were obtained from the base of the skull through the vertex without intravenous contrast. COMPARISON:  02/04/2011 FINDINGS: Brain: No evidence of acute infarction, hemorrhage, hydrocephalus, extra-axial collection or mass lesion/mass effect. Vascular: No hyperdense vessel or unexpected calcification. Skull: Negative Sinuses/Orbits: Negative Other: None IMPRESSION: Negative CT head Electronically Signed   By: Marlan Palauharles  Clark M.D.   On: 03/03/2018 13:01   Ct Abdomen Pelvis W Contrast  Result Date: 03/04/2018 CLINICAL DATA:  47 year old female with history of nausea and epigastric pain. Heartburn. EXAM: CT ABDOMEN AND PELVIS WITH CONTRAST TECHNIQUE: Multidetector CT imaging of the abdomen and pelvis was performed using the standard protocol following bolus administration of intravenous contrast. CONTRAST:   100mL ISOVUE-300 IOPAMIDOL (ISOVUE-300) INJECTION 61% COMPARISON:  No priors. FINDINGS: Lower chest: Unremarkable. Hepatobiliary: No suspicious cystic or solid hepatic lesions. No intra or extrahepatic biliary ductal dilatation. Gallbladder is normal in appearance. Pancreas: No pancreatic mass. No pancreatic ductal dilatation. No pancreatic or peripancreatic fluid or inflammatory changes. Spleen: Unremarkable. Adrenals/Urinary Tract: Bilateral kidneys and bilateral adrenal glands are normal in appearance. No hydroureteronephrosis. Urinary bladder is normal in appearance. Stomach/Bowel: Normal appearance of the stomach. No pathologic dilatation of small bowel or colon. Normal appendix. Vascular/Lymphatic: No significant atherosclerotic disease, aneurysm or dissection noted in the abdominal or pelvic vasculature. Retroaortic left renal vein (normal anatomical variant) incidentally noted. No lymphadenopathy noted in the abdomen or pelvis. Reproductive: Status post hysterectomy. Ovaries are unremarkable in appearance. Other: No significant volume of ascites.  No pneumoperitoneum. Musculoskeletal: There are no aggressive appearing lytic or blastic lesions noted in the visualized portions of the skeleton. IMPRESSION: 1. No acute findings are noted in the abdomen or pelvis to account for the patient's symptoms. 2. Incidental findings, as above. Electronically Signed   By: Trudie Reedaniel  Entrikin M.D.   On: 03/04/2018 20:37   Dg Abdomen Acute W/chest  Result Date: 03/04/2018 CLINICAL DATA:  Pt c/o dizziness and lower abdominal pain x last hour. Was going back to the bathroom and wasn't able to make it so laid down on floor and asked son to call EMS. Also c/o blurred vision. EXAM: DG ABDOMEN ACUTE W/ 1V CHEST COMPARISON:  07/28/2016 FINDINGS: Heart size is normal. The lungs are free of focal consolidations and pleural effusions. No pulmonary edema. No free intraperitoneal air. Supine and decubitus views CT of the abdomen show  no bowel obstruction. No abnormal calcifications or evidence for organomegaly. Moderate stool burden in the descending colon. IMPRESSION: Negative abdominal radiographs.  No acute cardiopulmonary disease. Electronically Signed   By: Norva PavlovElizabeth  Brown M.D.   On: 03/04/2018 19:13    Procedures Procedures (including critical care time)  Medications Ordered in ED Medications  sodium chloride 0.9 % bolus 1,000 mL (0 mLs Intravenous Stopped 03/04/18 1910)  famotidine (PEPCID) IVPB 20 mg premix (0 mg Intravenous Stopped 03/04/18 1953)  iopamidol (ISOVUE-300) 61 % injection 100 mL (100 mLs Intravenous Contrast Given 03/04/18 2000)  ketorolac (TORADOL) 30 MG/ML injection 30 mg (30 mg Intravenous Given 03/04/18 2135)     Initial Impression / Assessment and Plan / ED Course  I have reviewed the triage vital signs and the nursing notes.  Pertinent labs & imaging results that were available during my care of the patient were reviewed by me and considered in my  medical decision making (see chart for details).     47 year old female with a past medical history of interstitial cystitis, GERD presents to ED for evaluation of 3-hour history of nausea, epigastric abdominal discomfort and lower abdominal pain.  States that symptoms began after she ate a meal of a salad and chili from [facility].  Pain got worse when she tried to urinate in the bathroom.  She was seen and evaluated 8/30 for dizziness and was diagnosed with vertigo and given meclizine.  States that this is help with her dizziness.  Reports some dysuria.  Denies any alcohol, tobacco or other drug use.  Denies any chest pain, shortness of breath or fever.  On physical exam there is tenderness palpation of the generalized abdomen.  No rebound or guarding noted.  No CVA tenderness noted.  Lab work significant for leukocytosis at 13, which is higher than reading yesterday.  CMP, urinalysis, lipase unremarkable.  CT of the abdomen pelvis and x-ray of the abdomen  with no acute abnormalities.  Patient was given IV fluids, Pepcid and Toradol with significant improvement in her symptoms.  She is able to tolerate p.o. intake without difficulty prior to discharge.  Suspect that her symptoms are viral in nature.  Doubt cardiac, surgical or emergent cause of symptoms such as appendicitis, cholecystitis or perforated ulcer.  Will advise her to return to ED for any severe worsening symptoms.  Portions of this note were generated with Scientist, clinical (histocompatibility and immunogenetics). Dictation errors may occur despite best attempts at proofreading.   Final Clinical Impressions(s) / ED Diagnoses   Final diagnoses:  Gastritis without bleeding, unspecified chronicity, unspecified gastritis type    ED Discharge Orders         Ordered    ondansetron (ZOFRAN ODT) 4 MG disintegrating tablet  Every 8 hours PRN     03/04/18 2211    famotidine (PEPCID) 20 MG tablet  2 times daily     03/04/18 2211           Dietrich Pates, PA-C 03/04/18 2211    Maia Plan, MD 03/05/18 1115

## 2018-03-04 NOTE — ED Notes (Addendum)
Water was given to patient for PO challenge

## 2018-03-04 NOTE — ED Triage Notes (Signed)
Per EMS report: Pt c/o dizziness and abdominal pain x last hour.  Was going back to be from bathroom and wasn't able to make it so laid down on floor and asked son to call EMS.  Also c/o blurred vision.  Was seen here for the same yesterday and dx w/vertigo.  States RX isn't helping.

## 2018-03-04 NOTE — Discharge Instructions (Signed)
Return to ED for worsening symptoms, severe abdominal pain or chest pain, vomiting or coughing up blood, lightheadedness or loss of consciousness. °

## 2018-03-04 NOTE — ED Notes (Signed)
Patient transported to CT 

## 2018-04-17 ENCOUNTER — Ambulatory Visit: Payer: PRIVATE HEALTH INSURANCE | Admitting: Neurology

## 2018-04-17 ENCOUNTER — Encounter: Payer: Self-pay | Admitting: Neurology

## 2018-04-17 ENCOUNTER — Telehealth: Payer: Self-pay | Admitting: Neurology

## 2018-04-17 VITALS — BP 145/86 | HR 80 | Ht 67.0 in | Wt 212.0 lb

## 2018-04-17 DIAGNOSIS — H9312 Tinnitus, left ear: Secondary | ICD-10-CM | POA: Diagnosis not present

## 2018-04-17 DIAGNOSIS — H9193 Unspecified hearing loss, bilateral: Secondary | ICD-10-CM

## 2018-04-17 DIAGNOSIS — H81399 Other peripheral vertigo, unspecified ear: Secondary | ICD-10-CM

## 2018-04-17 NOTE — Telephone Encounter (Signed)
[facility] order sent to GI pt is aware. They obtain auth and will reach out to the pt to schedule. Also the patient is aware that the order was sent to GI. She has their number of [phone] and to call if she has not heard from them in the next 2-3 business days.

## 2018-04-17 NOTE — Patient Instructions (Addendum)
I would recommend ENT evaluation. Please talk to Dr. Zachery Dauer again. You have hearing loss at times, on the left and ringing in the ears, left more than right.   Please go back to PT, for consideration of vestibular rehab. You can talk to Dr. Zachery Dauer about it again.   Please ask family and friends and your husband in particular if you snore and if so, how loud it is, and if you have breathing related issues in your sleep, such as: snorting sounds, choking sounds, pauses in your breathing or shallow breathing events. These may be symptoms of obstructive sleep apnea (OSA). You do have morning headaches at times and sleepiness during the day. We can consider a sleep study.   We will do a brain scan, called MRI and call you with the test results. We will have to schedule you for this on a separate date. This test requires authorization from your insurance, and we will take care of the insurance process.  So long as your brain scan is reassuring, I can see you back as needed.

## 2018-04-17 NOTE — Progress Notes (Signed)
Subjective:    Patient ID: Maria Blackburn is a 47 y.o. female.  HPI     Huston Foley, MD, PhD [facility] [address], [address] [address] [place], [place] [address]  Dear Dr. Zachery Dauer,   I saw your patient, Maria Blackburn, upon your kind request, in my neurologic clinic today for initial consultation of her dizziness, and vertigo. The patient is unaccompanied today. As you know, Maria Blackburn is a 47 -year-old right-handed woman with an underlying medical history of reflux disease, interstitial cystitis, pelvic pain with stress incontinence, anxiety, and obesity, who reports intermittent dizziness for the past 2 months. She has had vertigo symptoms including spinning sensation, nausea and vomiting. She presented to the emergency room on 03/04/2018 with abdominal pain and dizziness. I reviewed the emergency room records. Head CT without contrast on 03/03/2018 showed: Negative head CT. She had a CT abdomen and pelvis with contrast on 03/04/2018 which showed: No acute findings are noted in the abdomen or pelvis to account for the patient's symptoms. Incidental findings as above. She had an abdominal x-ray on 03/04/2018 which showed: Negative abdominal radiographs. She was treated in the emergency room symptomatically with IV fluids, Pepcid, and Toradol. She was diagnosed with gastritis without bleeding. Urinalysis was negative for UTI. CMP showed glucose of 111, otherwise unremarkable findings. CBC with differential showed WBC of 13 and elevated neutrophils. She was diagnosed with vertigo in mid-August or so and was treated with prednisone. She presented to the emergency room on 03/03/2018 with dizziness. She was treated symptomatically with IV fluids, meclizine and Zofran. Urinalysis on 03/03/2018 was negative for infection. I reviewed your office note from 03/30/2018, which you kindly included. She reported having some decreased hearing. She had additional lab work in your office  including CBC with differential, CMP and TSH. TSH was unremarkable, CMP was unremarkable, CBC with differential was unremarkable. As I understand, she was also referred to ENT. When she went to urgent care and MRA head was ordered but she has not had it done yet. She has also been to PT but went only twice.  Symptoms are intermittent, sometimes she feels dizzy after bending. She has had muffled hearing on the left, feels like she has had hearing loss on both sides. She has had ringing in her ears, left more than right which seems to be constant at this time. She does not have a history of migraines but has had some recurrent headaches including morning headaches. She is not sure if she snores. She has had some daytime somnolence. She has never had a sleep study.   Her Past Medical History Is Significant For: Past Medical History:  Diagnosis Date  . Anxiety   . Bladder spasms   . Frequency of urination   . GERD (gastroesophageal reflux disease)   . Interstitial cystitis   . Pelvic pain   . SUI (stress urinary incontinence, female)   . Urgency of urination     Her Past Surgical History Is Significant For: Past Surgical History:  Procedure Laterality Date  . ABDOMINAL HYSTERECTOMY  2000   ovaries remain  . CYSTO WITH HYDRODISTENSION N/A 03/24/2017   Procedure: CYSTOSCOPY/HYDRODISTENSION INSTILLATION OF MARCAINE AND PYRIDIUM;  Surgeon: Bjorn Pippin, MD;  Location: [facility];  Service: Urology;  Laterality: N/A;  . HYSTEROSCOPY W/D&C  1999   POLYPECTOMY  . TUBAL LIGATION Bilateral 1996    Her Family History Is Significant For: Family History  Problem Relation Age of Onset  .  Heart disease Mother   . Diabetes Son     Her Social History Is Significant For: Social History   Socioeconomic History  . Marital status: Married    Spouse name: Not on file  . Number of children: Not on file  . Years of education: Not on file  . Highest education level: Not on file   Occupational History  . Not on file  Social Needs  . Financial resource strain: Not on file  . Food insecurity:    Worry: Not on file    Inability: Not on file  . Transportation needs:    Medical: Not on file    Non-medical: Not on file  Tobacco Use  . Smoking status: Never Smoker  . Smokeless tobacco: Never Used  Substance and Sexual Activity  . Alcohol use: No  . Drug use: No  . Sexual activity: Not on file  Lifestyle  . Physical activity:    Days per week: Not on file    Minutes per session: Not on file  . Stress: Not on file  Relationships  . Social connections:    Talks on phone: Not on file    Gets together: Not on file    Attends religious service: Not on file    Active member of club or organization: Not on file    Attends meetings of clubs or organizations: Not on file    Relationship status: Not on file  Other Topics Concern  . Not on file  Social History Narrative  . Not on file    Her Allergies Are:  No Known Allergies:   Her Current Medications Are:  Outpatient Encounter Medications as of 04/17/2018  Medication Sig  . aspirin-acetaminophen-caffeine (EXCEDRIN MIGRAINE) [id number] MG tablet Take 2 tablets every 6 (six) hours as needed by mouth for headache.  . dimenhyDRINATE (DRAMAMINE) 50 MG tablet Take 50 mg by mouth every 8 (eight) hours as needed.  . famotidine (PEPCID) 20 MG tablet Take 1 tablet (20 mg total) by mouth 2 (two) times daily.  . hydrOXYzine (ATARAX/VISTARIL) 10 MG tablet Take 10 mg at bedtime by mouth.  Marland Kitchen ibuprofen (ADVIL,MOTRIN) 200 MG tablet Take 400-800 mg by mouth every 6 (six) hours as needed for headache.   . loratadine (CLARITIN) 10 MG tablet Take 10 mg by mouth daily as needed for allergies.  Marland Kitchen meclizine (ANTIVERT) 12.5 MG tablet Take 1 tablet (12.5 mg total) by mouth 3 (three) times daily as needed for dizziness.  . naproxen sodium (ALEVE) 220 MG tablet Take 440 mg by mouth 2 (two) times daily as needed (pain).  . ondansetron  (ZOFRAN ODT) 4 MG disintegrating tablet Take 1 tablet (4 mg total) by mouth every 8 (eight) hours as needed for nausea or vomiting.  . ondansetron (ZOFRAN) 4 MG tablet Take 1 tablet (4 mg total) by mouth every 6 (six) hours. (Patient taking differently: Take 4 mg by mouth every 6 (six) hours as needed for nausea. )  . sertraline (ZOLOFT) 100 MG tablet Take 150 mg by mouth at bedtime.    No facility-administered encounter medications on file as of 04/17/2018.   : Review of Systems:  Out of a complete 14 point review of systems, all are reviewed and negative with the exception of these symptoms as listed below: Review of Systems  Neurological:       Pt presents today to discuss her vertigo. Pt reports that her vertigo was continuous but not it seems to be intermittent. Pt's vertigo causes  a change in hearing.    Objective:  Neurological Exam  Physical Exam Physical Examination:   Vitals:   04/17/18 1118  BP: (!) 145/86  Pulse: 80   On orthostatic testing, lying blood pressure and pulse for 144/86 with a pulse of 83, sitting 145/86 with a pulse of 80, standing 141/87 with a pulse of 96. She has no orthostatic lightheadedness. No spinning sensation.  General Examination: The patient is a very pleasant 47 y.o. female in no acute distress. She appears well-developed and well-nourished and well groomed. Appears sleepy.  HEENT: Normocephalic, atraumatic, pupils are equal, round and reactive to light and accommodation. Extraocular tracking is good without limitation to gaze excursion or nystagmus noted. Normal smooth pursuit is noted. Hearing is grossly intact. Tympanic membranes are clear bilaterally, mild cerumen in the ear canals. Face is symmetric with normal facial animation and normal facial sensation. Speech is clear with no dysarthria noted. There is no hypophonia. There is no lip, neck/head, jaw or voice tremor. Neck is supple with full range of passive and active motion. There are no  carotid bruits on auscultation. Oropharynx exam reveals: mild mouth dryness, adequate dental hygiene and moderate airway crowding, due to smaller airway entry, wider uvula, tonsils of 1-2+ bilaterally. Mallampati is class II. Tongue protrudes centrally and palate elevates symmetrically. Neck size is 15.25 inches.   Chest: Clear to auscultation without wheezing, rhonchi or crackles noted.  Heart: S1+S2+0, regular and normal without murmurs, rubs or gallops noted.   Abdomen: Soft, non-tender and non-distended with normal bowel sounds appreciated on auscultation.  Extremities: There is no pitting edema in the distal lower extremities bilaterally. Pedal pulses are intact.  Skin: Warm and dry without trophic changes noted.  Musculoskeletal: exam reveals no obvious joint deformities, tenderness or joint swelling or erythema.   Neurologically:  Mental status: The patient is awake, alert and oriented in all 4 spheres. Her immediate and remote memory, attention, language skills and fund of knowledge are appropriate. There is no evidence of aphasia, agnosia, apraxia or anomia. Speech is clear with normal prosody and enunciation. Thought process is linear. Mood is normal and affect is normal.  Cranial nerves II - XII are as described above under HEENT exam. In addition: shoulder shrug is normal with equal shoulder height noted. Motor exam: Normal bulk, strength and tone is noted. There is no drift, tremor or rebound. Romberg is negative. Reflexes are 2+ to 3+ throughout. Fine motor skills and coordination: grossly intact.   Cerebellar testing: No dysmetria or intention tremor on finger to nose testing. Heel to shin is unremarkable bilaterally. There is no truncal or gait ataxia.  Sensory exam: intact to light touch.   Gait, station and balance: she stands slowly, without difficulty. She is able to stand narrow based. Tandem walk is slow but doable. No other gait problems, preserved arm swing.  Assessment  and Plan:  Assessment and Plan:  In summary, Maria Blackburn is a very pleasant 47 y.o.-year old female with an underlying medical history of reflux disease, interstitial cystitis, pelvic pain with stress incontinence, anxiety, and obesity, who presents for evaluation of her dizziness. Symptoms started about 2 months ago and are intermittent. She has had associated nausea and vomiting. She has had hearing loss and ringing in her ears. Neurologically, she has a nonfocal exam. She does report history of daytime somnolence and morning headaches. She may be at risk for obstructive sleep apnea. She is encouraged to think about sleep study testing. We will  proceed with a brain MRI with and without contrast with attention to the internal auditory canals bilaterally. She has tried meclizine. She has had a couple of visits to physical therapy as I understand. I would recommend referral to ENT going back to PT for consideration of vestibular rehabilitation which may help. She is encouraged to discuss this with you. We will call her with her MRI results and I will see her back on an as-needed basis, so long as the MRI results are reassuring as well. If she chooses to pursue a sleep study I would be happy to schedule that. I answered all her questions today and she was in agreement. Thank you very much for allowing me to participate in the care of this nice patient. If I can be of any further assistance to you please do not hesitate to call me at [phone].  Sincerely,   Huston Foley, MD, PhD

## 2018-05-03 ENCOUNTER — Ambulatory Visit
Admission: RE | Admit: 2018-05-03 | Discharge: 2018-05-03 | Disposition: A | Discharge status: Home / Self Care | Payer: PRIVATE HEALTH INSURANCE | Source: Ambulatory Visit | Attending: Neurology | Admitting: Neurology

## 2018-05-03 DIAGNOSIS — R42 Dizziness and giddiness: Secondary | ICD-10-CM

## 2018-05-03 DIAGNOSIS — H81399 Other peripheral vertigo, unspecified ear: Secondary | ICD-10-CM | POA: Diagnosis not present

## 2018-05-03 MED ORDER — GADOBENATE DIMEGLUMINE 529 MG/ML IV SOLN
20.0000 mL | Freq: Once | INTRAVENOUS | Status: AC | PRN
  Administered 2018-05-03: 20 mL via INTRAVENOUS

## 2018-05-04 NOTE — Progress Notes (Signed)
Please advise patient that her recent brain MRI and internal auditory canal scan with and without contrast was reported as normal. FU with PCP, as discussed.  Maria Blackburn

## 2018-05-05 ENCOUNTER — Telehealth: Payer: Self-pay

## 2018-05-05 NOTE — Telephone Encounter (Signed)
-----   Message from Huston Foley, MD sent at 05/04/2018  5:09 PM EDT ----- Please advise patient that her recent brain MRI and internal auditory canal scan with and without contrast was reported as normal. FU with PCP, as discussed.  Janene Harvey

## 2018-05-05 NOTE — Telephone Encounter (Signed)
I called pt and left a detailed message on her cell phone, per DPR, advising her of normal MRI results. I asked her to call us back with further questions or concerns but for now Dr. Frances Furbish recommends a follow up with her PCP.

## 2019-04-05 ENCOUNTER — Other Ambulatory Visit: Payer: Self-pay

## 2019-04-05 DIAGNOSIS — Z20828 Contact with and (suspected) exposure to other viral communicable diseases: Secondary | ICD-10-CM | POA: Diagnosis not present

## 2019-04-05 DIAGNOSIS — Z20822 Contact with and (suspected) exposure to covid-19: Secondary | ICD-10-CM

## 2019-04-05 DIAGNOSIS — R509 Fever, unspecified: Secondary | ICD-10-CM | POA: Diagnosis not present

## 2019-04-06 LAB — NOVEL CORONAVIRUS, NAA: SARS-CoV-2, NAA: DETECTED — AB

## 2019-04-14 ENCOUNTER — Other Ambulatory Visit: Payer: Self-pay

## 2019-04-14 ENCOUNTER — Encounter (HOSPITAL_COMMUNITY): Payer: Self-pay | Admitting: Emergency Medicine

## 2019-04-14 ENCOUNTER — Emergency Department (HOSPITAL_COMMUNITY)
Admission: EM | Admit: 2019-04-14 | Discharge: 2019-04-14 | Disposition: A | Discharge status: Home / Self Care | Payer: BC Managed Care – PPO | Attending: Emergency Medicine | Admitting: Emergency Medicine

## 2019-04-14 DIAGNOSIS — Z79899 Other long term (current) drug therapy: Secondary | ICD-10-CM | POA: Diagnosis not present

## 2019-04-14 DIAGNOSIS — R110 Nausea: Secondary | ICD-10-CM | POA: Diagnosis not present

## 2019-04-14 DIAGNOSIS — L509 Urticaria, unspecified: Secondary | ICD-10-CM

## 2019-04-14 DIAGNOSIS — U071 COVID-19: Secondary | ICD-10-CM | POA: Diagnosis not present

## 2019-04-14 DIAGNOSIS — R21 Rash and other nonspecific skin eruption: Secondary | ICD-10-CM | POA: Diagnosis not present

## 2019-04-14 MED ORDER — ONDANSETRON HCL 4 MG PO TABS: 4.0000 mg | ORAL_TABLET | Freq: Four times a day (QID) | ORAL | 0 refills | Status: DC

## 2019-04-14 MED ORDER — PREDNISONE 20 MG PO TABS: 40.0000 mg | ORAL_TABLET | Freq: Every day | ORAL | 0 refills | Status: DC

## 2019-04-14 NOTE — Discharge Instructions (Addendum)
Take prednisone as prescribed.  Take Benadryl every 6 hours as needed for itching.  Take Zofran as needed for nausea.  Please follow-up with your primary care provider in 3 to 5 days for continued evaluation.  Return to the ED immediately for new or worsening symptoms or concerns, such as difficulty breathing, swelling of the face, difficulty swallowing, vomiting, fevers or any concerns at all.

## 2019-04-14 NOTE — ED Triage Notes (Signed)
Patient arrived by self from home.   Pt c/o skin rash that has appeared all over body that started last night. Pt presents w/ erythema present on face and both arms bilaterally.   Pt stated they took a a Zyrtec medication last night. Pt also took codeine last night.   Pt alert and oriented x 4. Patient doesn't appear to be in distress. No SOB or dyspnea.   No active vomiting.

## 2019-04-14 NOTE — ED Provider Notes (Signed)
[facility]-EMERGENCY DEPT Provider Note   CSN: [id number] Arrival date & time: 04/14/19  0917     History   Chief Complaint Chief Complaint  Patient presents with  . Rash    HPI Maria Blackburn is a 48 y.o. female.     HPI  48 year old female presents with a nonspecific rash for the last 2 days.  Patient states that she was diagnosed with COVID 2 weeks ago.  She is at the end of her quarantine.  Last night she started having a rash on her right lower extremity.  Over the night rash progressed to her bilateral legs, arms, chest, abdomen and back.  She describes the rash as pruritic.  She denies any fevers, chills.  She notes some mild nausea and decreased appetite persistent from her COVID diagnosis 2 weeks ago.  She denies any difficulty swallowing or breathing, syncope, vomiting.  Past Medical History:  Diagnosis Date  . Anxiety   . Bladder spasms   . Frequency of urination   . GERD (gastroesophageal reflux disease)   . Interstitial cystitis   . Pelvic pain   . SUI (stress urinary incontinence, female)   . Urgency of urination     There are no active problems to display for this patient.   Past Surgical History:  Procedure Laterality Date  . ABDOMINAL HYSTERECTOMY  2000   ovaries remain  . CYSTO WITH HYDRODISTENSION N/A 03/24/2017   Procedure: CYSTOSCOPY/HYDRODISTENSION INSTILLATION OF MARCAINE AND PYRIDIUM;  Surgeon: Bjorn Pippin, MD;  Location: [facility];  Service: Urology;  Laterality: N/A;  . HYSTEROSCOPY W/D&C  1999   POLYPECTOMY  . TUBAL LIGATION Bilateral 1996     OB History   No obstetric history on file.      Home Medications    Prior to Admission medications   Medication Sig Start Date End Date Taking? Authorizing Provider  aspirin-acetaminophen-caffeine (EXCEDRIN MIGRAINE) [phone] MG tablet Take 2 tablets every 6 (six) hours as needed by mouth for headache.    [provider]  dimenhyDRINATE  (DRAMAMINE) 50 MG tablet Take 50 mg by mouth every 8 (eight) hours as needed.    [provider]  famotidine (PEPCID) 20 MG tablet Take 1 tablet (20 mg total) by mouth 2 (two) times daily. 03/04/18   Khatri, Hina, PA-C  hydrOXYzine (ATARAX/VISTARIL) 10 MG tablet Take 10 mg at bedtime by mouth. 04/07/17   [provider]  ibuprofen (ADVIL,MOTRIN) 200 MG tablet Take 400-800 mg by mouth every 6 (six) hours as needed for headache.     [provider]  loratadine (CLARITIN) 10 MG tablet Take 10 mg by mouth daily as needed for allergies.    [provider]  meclizine (ANTIVERT) 12.5 MG tablet Take 1 tablet (12.5 mg total) by mouth 3 (three) times daily as needed for dizziness. 03/03/18   Wynetta Fines, MD  naproxen sodium (ALEVE) 220 MG tablet Take 440 mg by mouth 2 (two) times daily as needed (pain).    [provider]  ondansetron (ZOFRAN ODT) 4 MG disintegrating tablet Take 1 tablet (4 mg total) by mouth every 8 (eight) hours as needed for nausea or vomiting. 03/04/18   Khatri, Hina, PA-C  ondansetron (ZOFRAN) 4 MG tablet Take 1 tablet (4 mg total) by mouth every 6 (six) hours. 04/14/19   Laiana Fratus S, PA-C  predniSONE (DELTASONE) 20 MG tablet Take 2 tablets (40 mg total) by mouth daily with breakfast. 04/14/19   Birdie Riddle, [employer]  S, PA-C  sertraline (ZOLOFT) 100 MG tablet Take 150 mg by mouth at bedtime.     [provider]    Family History Family History  Problem Relation Age of Onset  . Heart disease Mother   . Diabetes Son     Social History Social History   Tobacco Use  . Smoking status: Never Smoker  . Smokeless tobacco: Never Used  Substance Use Topics  . Alcohol use: No  . Drug use: No     Allergies   Patient has no known allergies.   Review of Systems Review of Systems  Constitutional: Negative for chills and fever.  Respiratory: Negative for shortness of breath.   Cardiovascular: Negative for chest pain.   Gastrointestinal: Negative for abdominal pain, nausea and vomiting.  Skin: Positive for rash.     Physical Exam Updated Vital Signs BP (!) 153/93   Pulse 93   Temp 98.9 F (37.2 C) (Oral)   Resp 18   Ht 5\' 7"  (1.702 m)   Wt 90.7 kg   SpO2 96%   BMI 31.32 kg/m   Physical Exam Vitals signs and nursing note reviewed.  Constitutional:      Appearance: She is well-developed.  HENT:     Head: Normocephalic and atraumatic.     Mouth/Throat:     Pharynx: Oropharynx is clear. Uvula midline. No pharyngeal swelling or posterior oropharyngeal erythema.  Eyes:     Conjunctiva/sclera: Conjunctivae normal.  Neck:     Musculoskeletal: Neck supple.  Cardiovascular:     Rate and Rhythm: Normal rate and regular rhythm.     Heart sounds: Normal heart sounds. No murmur.  Pulmonary:     Effort: Pulmonary effort is normal. No respiratory distress.     Breath sounds: Normal breath sounds. No wheezing or rales.  Abdominal:     General: Bowel sounds are normal. There is no distension.     Palpations: Abdomen is soft.     Tenderness: There is no abdominal tenderness.  Musculoskeletal: Normal range of motion.        General: No tenderness or deformity.  Skin:    General: Skin is warm and dry.     Findings: Rash present. Rash is purpuric and urticarial. Rash is not pustular, scaling or vesicular.     Comments: Patient with a diffuse urticarial rash noted on bilateral arms, legs, abdomen, back.  She also has isolated urticaria over the right eye and behind the left ear.  Blanchable.  No petechiae or purpura.  No bulla, vesicles or pustules  Neurological:     Mental Status: She is alert and oriented to person, place, and time.  Psychiatric:        Behavior: Behavior normal.      ED Treatments / Results  Labs (all labs ordered are listed, but only abnormal results are displayed) Labs Reviewed - No data to display  EKG None  Radiology No results found.  Procedures Procedures  (including critical care time)  Medications Ordered in ED Medications - No data to display   Initial Impression / Assessment and Plan / ED Course  I have reviewed the triage vital signs and the nursing notes.  Pertinent labs & imaging results that were available during my care of the patient were reviewed by me and considered in my medical decision making (see chart for details).        Patient presents with a nonspecific urticarial rash which started last night.  Rash is diffuse.  She denies any associated difficulty swallowing or breathing.  She denies any syncope or vomiting.  There is no evidence of angioedema or anaphylaxis.  Patient has no petechiae or purpura, pustules or vesicles.  Rash not consistent with shingles, TEN, SJS.  She recently was diagnosed with COVID.  Discussed with attending, Dr. Freida BusmanAllen.  He has seen multiple rashes associated with COVID.  Will trial prednisone and Benadryl.  Patient notes that she has been nauseous associated with her COVID diagnosis 2 weeks ago.  Will give her some Zofran for home.  She was given strict return precautions and expressed understanding.  Patient ready and stable for discharge.   At this time there does not appear to be any evidence of an acute emergency medical condition and the patient appears stable for discharge with appropriate outpatient follow up.Diagnosis was discussed with patient who verbalizes understanding and is agreeable to discharge. Pt case discussed with Dr. Freida BusmanAllen who agrees with my plan.   Final Clinical Impressions(s) / ED Diagnoses   Final diagnoses:  Urticaria    ED Discharge Orders         Ordered    ondansetron (ZOFRAN) 4 MG tablet  Every 6 hours     04/14/19 1033    predniSONE (DELTASONE) 20 MG tablet  Daily with breakfast     04/14/19 [address]           Octavio Matheney, Ovalaitlyn S, PA-C 04/14/19 1707    Lorre NickAllen, Anthony, MD 04/15/19 1415

## 2019-04-24 ENCOUNTER — Other Ambulatory Visit: Payer: Self-pay

## 2019-04-24 DIAGNOSIS — Z20822 Contact with and (suspected) exposure to covid-19: Secondary | ICD-10-CM

## 2019-04-25 LAB — NOVEL CORONAVIRUS, NAA: SARS-CoV-2, NAA: NOT DETECTED

## 2019-09-18 IMAGING — CT CT HEAD W/O CM
3 series · 14 of 44 positions shown, 16 images · non-contrast
Comparison: 02/04/2011

CLINICAL DATA: Peripheral vertigo

EXAM:
CT HEAD WITHOUT CONTRAST
TECHNIQUE: Contiguous axial images were obtained from the base of the skull
through the vertex without intravenous contrast.

[Series 2: head wo · axial · 0.43mm/px · z∈[-115,-5]mm · 8 of 27 slices shown, 10 images]
[im 3/27  brain]
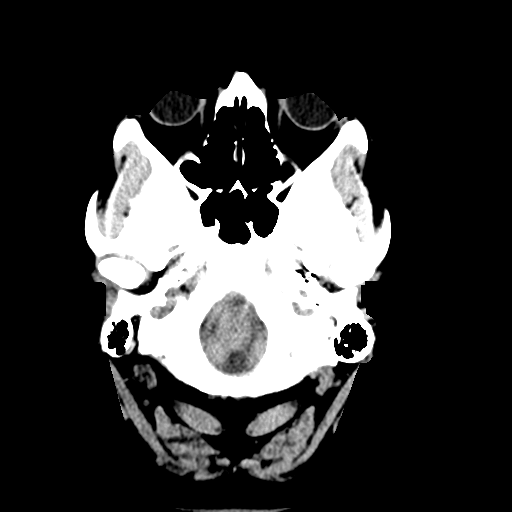
[im 3/27  bone]
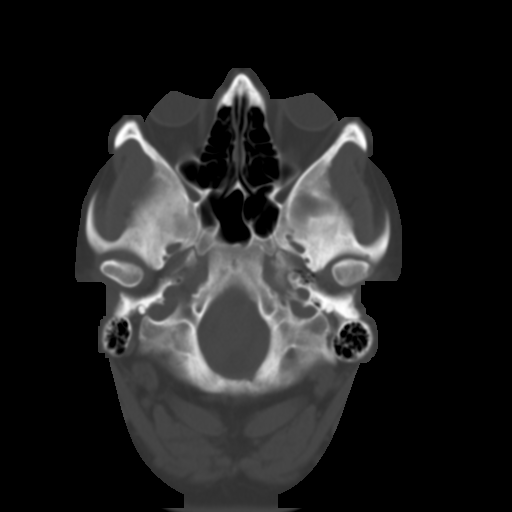
[im 6/27  brain]
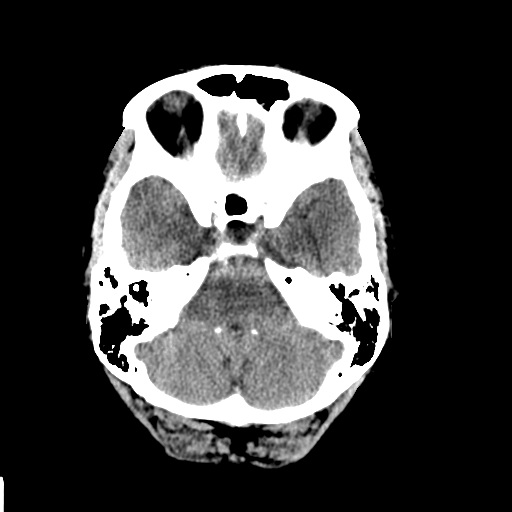
[im 9/27  brain]
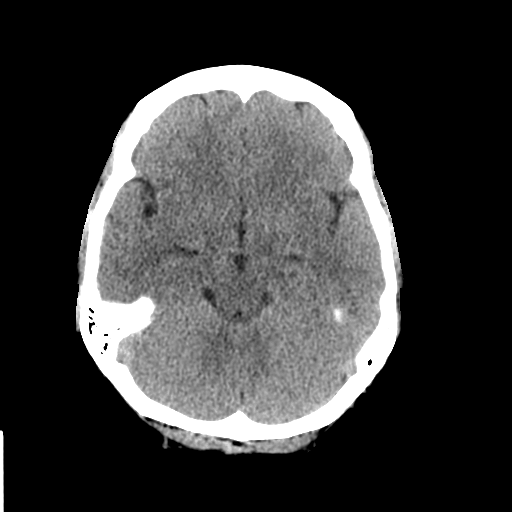
[im 12/27  brain]
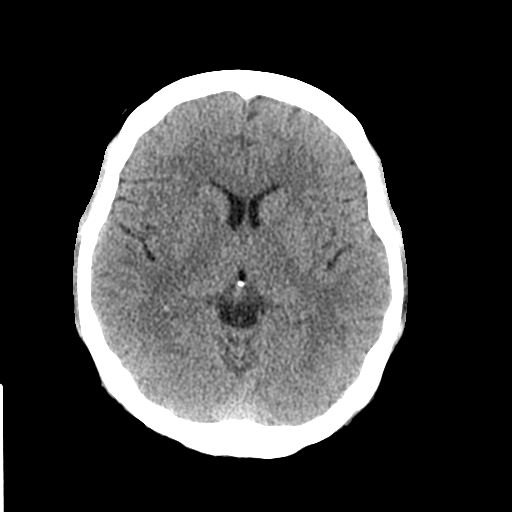
[im 16/27  brain]
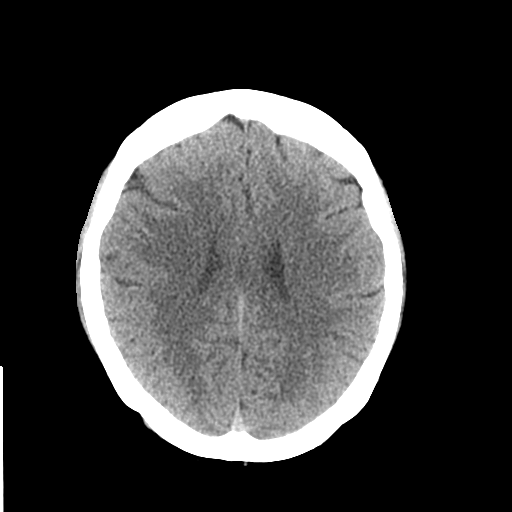
[im 16/27  bone]
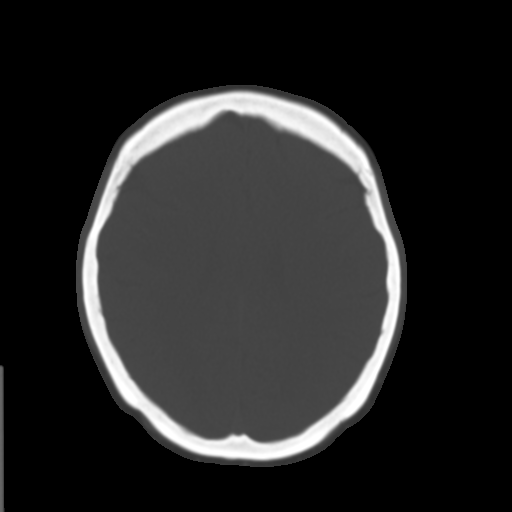
[im 19/27  brain]
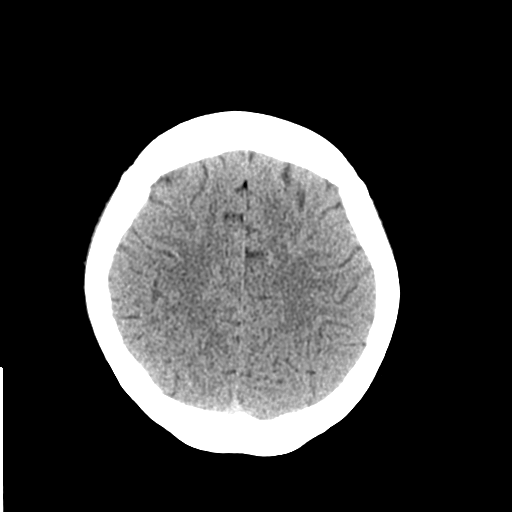
[im 22/27  brain]
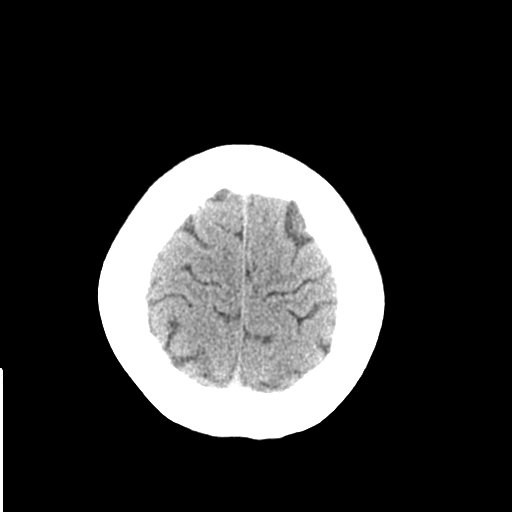
[im 25/27  brain]
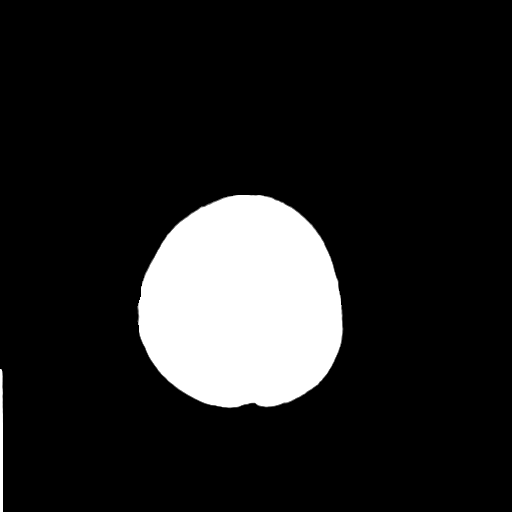

[Series 4: coronal soft tissue · coronal · 0.28mm/px · 3 of 77 slices shown]
[im 26/77  brain]
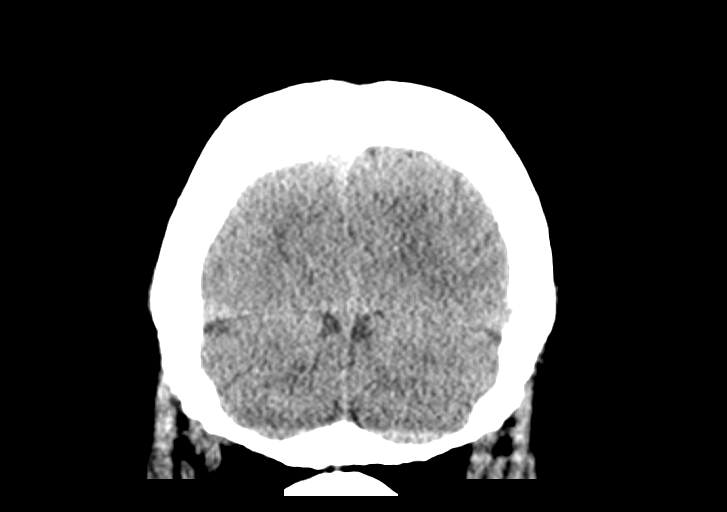
[im 34/77  brain]
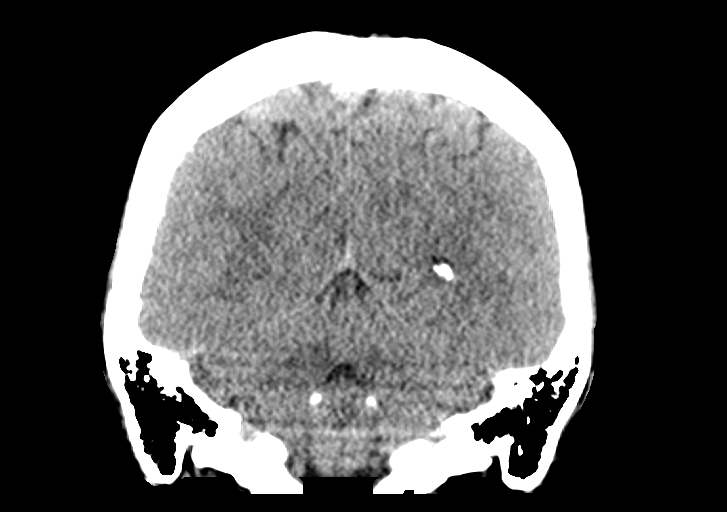
[im 43/77  brain]
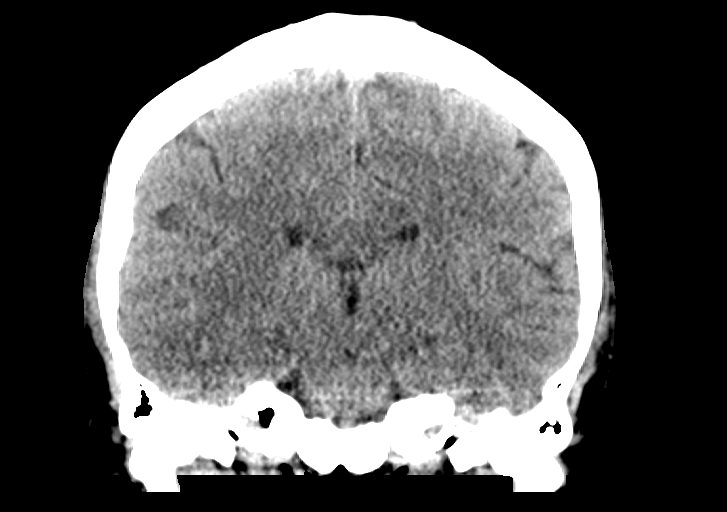

[Series 5: sagittal soft tissue · sagittal · 0.29mm/px · 3 of 84 slices shown]
[im 28/84  brain]
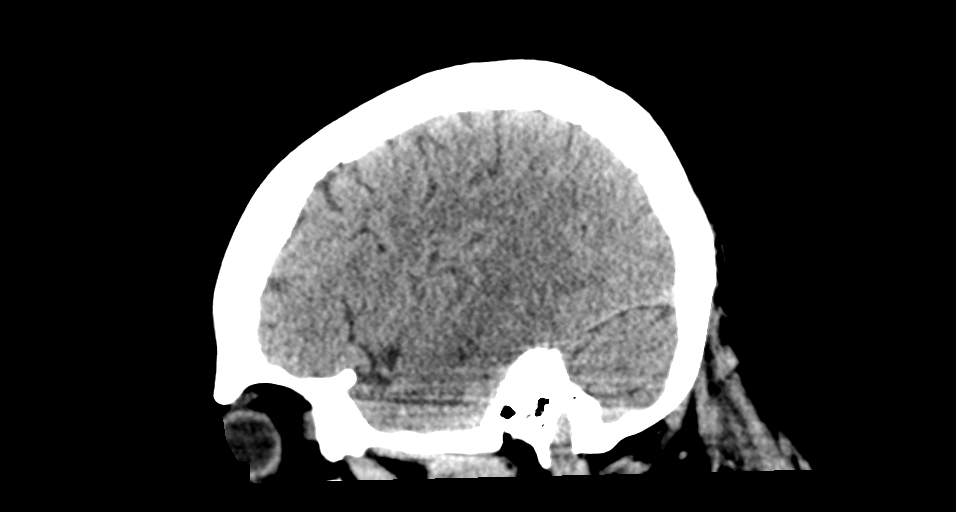
[im 42/84  brain]
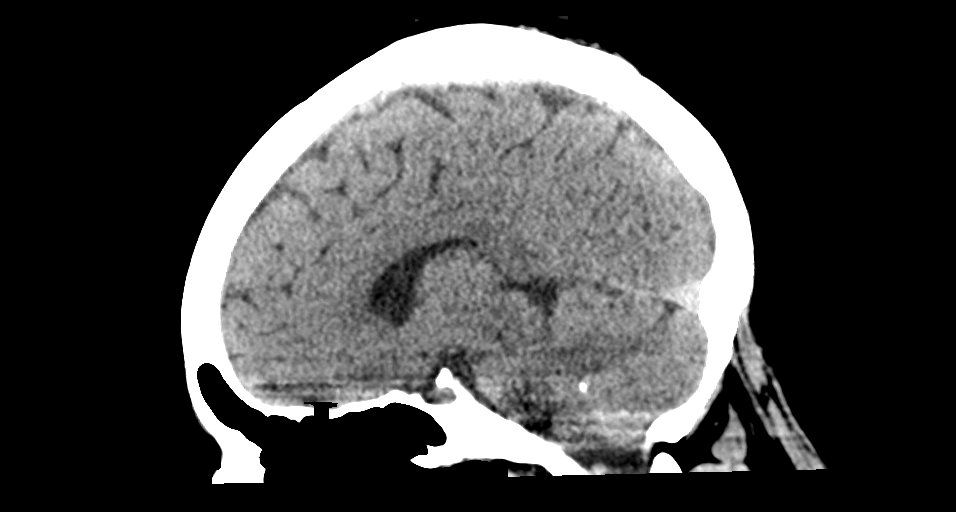
[im 56/84  brain]
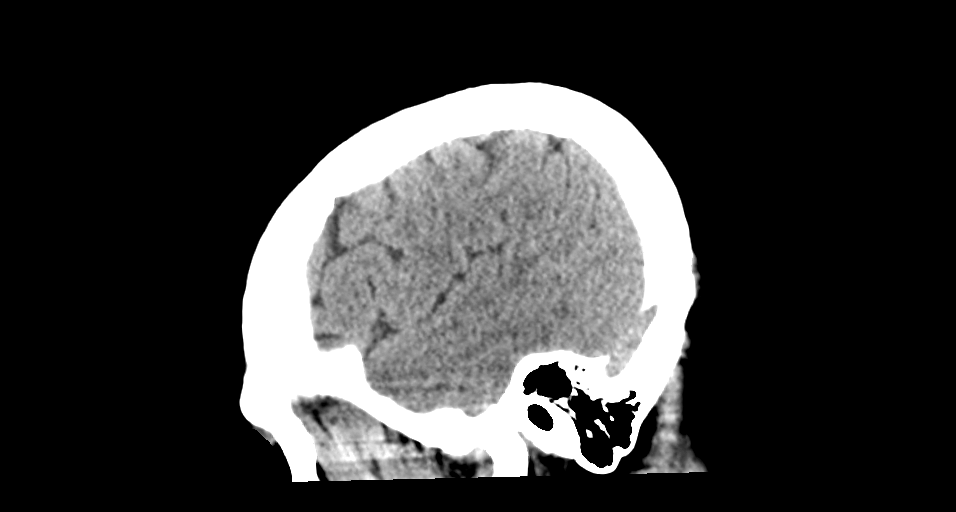

[14 of 44 positions shown; findings below may reference images not displayed]

FINDINGS: Brain: No evidence of acute infarction, hemorrhage, hydrocephalus,
extra-axial collection or mass lesion/mass effect.

Vascular: No hyperdense vessel or unexpected calcification.

Skull: Negative

Sinuses/Orbits: Negative

Other: None
IMPRESSION: Negative CT head

## 2021-09-10 ENCOUNTER — Encounter: Payer: Self-pay | Admitting: Orthopedic Surgery

## 2021-09-10 ENCOUNTER — Ambulatory Visit: Payer: 59 | Admitting: Orthopedic Surgery

## 2021-09-10 ENCOUNTER — Other Ambulatory Visit: Payer: Self-pay

## 2021-09-10 ENCOUNTER — Ambulatory Visit (INDEPENDENT_AMBULATORY_CARE_PROVIDER_SITE_OTHER): Payer: 59

## 2021-09-10 VITALS — Ht 67.0 in | Wt 190.0 lb

## 2021-09-10 DIAGNOSIS — M7711 Lateral epicondylitis, right elbow: Secondary | ICD-10-CM | POA: Diagnosis not present

## 2021-09-10 DIAGNOSIS — M25532 Pain in left wrist: Secondary | ICD-10-CM | POA: Diagnosis not present

## 2021-09-10 DIAGNOSIS — M654 Radial styloid tenosynovitis [de Quervain]: Secondary | ICD-10-CM

## 2021-09-10 DIAGNOSIS — M25521 Pain in right elbow: Secondary | ICD-10-CM

## 2021-09-10 MED ORDER — BETAMETHASONE SOD PHOS & ACET 6 (3-3) MG/ML IJ SUSP
6.0000 mg | INTRAMUSCULAR | Status: AC | PRN
  Administered 2021-09-10: 10:00:00 6 mg via INTRA_ARTICULAR

## 2021-09-10 MED ORDER — LIDOCAINE HCL 1 % IJ SOLN
1.0000 mL | INTRAMUSCULAR | Status: AC | PRN
  Administered 2021-09-10: 10:00:00 1 mL

## 2021-09-10 NOTE — Progress Notes (Signed)
? ?Office Visit Note ?  ?Patient: Maria Blackburn           ?Date of Birth: 24-Dec-1970           ?MRN: [id number] ?Visit Date: 09/10/2021 ?             ?Requested by: Clayborn Heron, MD ?[address] ?[place],  [place] [address] ?PCP: Clayborn Heron, MD ? ? ?Assessment & Plan: ?Visit Diagnoses:  ?1. Pain in left wrist   ?2. Pain in right elbow   ?3. De Quervain's tenosynovitis, left   ?4. Lateral epicondylitis, right elbow   ? ? ?Plan: We discussed the diagnosis, prognosis, non-operative and operative treatment options for both lateral epicondylitis and De Quervain's tenosynovitis. ? ?After our discussion, the patient would like to proceed with corticosteroid injection into the left first dorsal compartment and a left thumb spica brace to help with taking care of her grandchild. She will wear this brace at night for potential carpal tunnel syndrome given her symptoms. Regarding the lateral epicondylitis, we will try a tennis elbow strap, consistent oral NSAID use, and a home exercise program. We reviewed the risks and benefits of conservative management.  The patient expressed understanding of the reasoning and strategy going forward. ? ?All patient questions and concerns were addressed.  ? ? ?Follow-Up Instructions: No follow-ups on file.  ? ?Orders:  ?Orders Placed This Encounter  ?Procedures  ? XR Elbow 2 Views Right  ? XR Wrist Complete Left  ? ?No orders of the defined types were placed in this encounter. ? ? ? ? Procedures: ?Hand/UE Inj: L extensor compartment 1 for de Quervain's tenosynovitis on 09/10/2021 9:32 AM ?Indications: tendon swelling and therapeutic ?Details: 25 G needle, radial approach ?Medications: 1 mL lidocaine 1 %; 6 mg betamethasone acetate-betamethasone sodium phosphate 6 (3-3) MG/ML ?Outcome: tolerated well, no immediate complications ?Procedure, treatment alternatives, risks and benefits explained, specific risks discussed. Consent was given by the patient. Immediately prior to  procedure a time out was called to verify the correct patient, procedure, equipment, support staff and site/side marked as required. Patient was prepped and draped in the usual sterile fashion.  ? ? ? ? ?Clinical Data: ?No additional findings. ? ? ?Subjective: ?Chief Complaint  ?Patient presents with  ? Left Wrist - Pain  ? Right Elbow - Pain  ? ? ?This is a 51 year old right-hand-dominant female who presents with left radial wrist and right lateral elbow pain.  Left wrist pains been going on for several months now but gotten worse over the last several weeks.  She points to the radial styloid as the area of the most severe pain.  She describes it as both aching and occasionally burning type pain.  She cares for her grandchild and has difficulty lifting the baby secondary to radial wrist pain.  She has tried ibuprofen occasionally.  She is to try heat and ice.  She has tried Biofreeze and worn a neoprene sleeve.  She also describes occasional finger numbness in the thumb, index, and middle finger with no symptoms in the ring or small finger.  This does not wake her up at night.  The symptoms are intermittent.  She describes pain at the lateral aspect of the right elbow for the last 2 weeks or so.  Is gotten worse with more frequent caring for her grandchild.  The pain is at the lateral epicondyle and radiates distally into the dorsal proximal forearm.  She has not tried anything for this  yet. ? ? ?Review of Systems ? ? ?Objective: ?Vital Signs: Ht 5\' 7"  (1.702 m)   Wt 190 lb (86.2 kg)   BMI 29.76 kg/m?  ? ?Physical Exam ?Constitutional:   ?   Appearance: Normal appearance.  ?Cardiovascular:  ?   Rate and Rhythm: Normal rate.  ?   Pulses: Normal pulses.  ?Pulmonary:  ?   Effort: Pulmonary effort is normal.  ?Skin: ?   General: Skin is warm and dry.  ?   Capillary Refill: Capillary refill takes less than 2 seconds.  ?Neurological:  ?   Mental Status: She is alert.  ? ? ?Left Hand Exam  ? ?Tenderness  ?Left hand  tenderness location: TTP directly over radial styloid and first dorsal compartment.  ? ?Range of Motion  ?The patient has normal left wrist ROM. ? ?Other  ?Erythema: absent ?Sensation: normal ?Pulse: present ? ?Comments:  ++ Finkelstein test.  No thumb triggering.  Negative CMC grind test. Questionable Tinel sign and negative Phalen sign.  ? ? ?Right Elbow Exam  ? ?Tenderness  ?The patient is experiencing tenderness in the lateral epicondyle.  ? ?Range of Motion  ?The patient has normal right elbow ROM. ? ?Muscle Strength  ?The patient has normal right elbow strength. ? ?Other  ?Erythema: absent ?Sensation: normal ?Pulse: present ? ?Comments:  Reproduction of pain w/ resisted middle finger extension with elbow extended.  TTP directly over lateral epicondyle and just distal.  ? ? ? ? ?Specialty Comments:  ?No specialty comments available. ? ?Imaging: ?No results found. ? ? ?PMFS History: ?Patient Active Problem List  ? Diagnosis Date Noted  ? De Quervain's tenosynovitis, left 09/10/2021  ? Lateral epicondylitis, right elbow 09/10/2021  ? ?Past Medical History:  ?Diagnosis Date  ? Anxiety   ? Bladder spasms   ? Frequency of urination   ? GERD (gastroesophageal reflux disease)   ? Interstitial cystitis   ? Pelvic pain   ? SUI (stress urinary incontinence, female)   ? Urgency of urination   ?  ?Family History  ?Problem Relation Age of Onset  ? Heart disease Mother   ? Diabetes Son   ?  ?Past Surgical History:  ?Procedure Laterality Date  ? ABDOMINAL HYSTERECTOMY  2000  ? ovaries remain  ? CYSTO WITH HYDRODISTENSION N/A 03/24/2017  ? Procedure: CYSTOSCOPY/HYDRODISTENSION INSTILLATION OF MARCAINE AND PYRIDIUM;  Surgeon: 03/26/2017, MD;  Location: [facility];  Service: Urology;  Laterality: N/A;  ? HYSTEROSCOPY WITH D & C  1999  ? POLYPECTOMY  ? TUBAL LIGATION Bilateral 1996  ? ?Social History  ? ?Occupational History  ? Not on file  ?Tobacco Use  ? Smoking status: Never  ? Smokeless tobacco: Never   ?Substance and Sexual Activity  ? Alcohol use: No  ? Drug use: No  ? Sexual activity: Not on file  ? ? ? ? ? ? ?

## 2021-10-22 ENCOUNTER — Ambulatory Visit: Payer: 59 | Admitting: Orthopedic Surgery

## 2022-04-23 ENCOUNTER — Emergency Department (HOSPITAL_COMMUNITY): Payer: Worker's Compensation

## 2022-04-23 ENCOUNTER — Other Ambulatory Visit: Payer: Self-pay

## 2022-04-23 ENCOUNTER — Emergency Department (HOSPITAL_COMMUNITY)
Admission: EM | Admit: 2022-04-23 | Discharge: 2022-04-24 | Disposition: A | Discharge status: Home / Self Care | Payer: Worker's Compensation | Attending: Emergency Medicine | Admitting: Emergency Medicine

## 2022-04-23 ENCOUNTER — Encounter (HOSPITAL_COMMUNITY): Payer: Self-pay | Admitting: Emergency Medicine

## 2022-04-23 DIAGNOSIS — T148XXA Other injury of unspecified body region, initial encounter: Secondary | ICD-10-CM

## 2022-04-23 DIAGNOSIS — Y990 Civilian activity done for income or pay: Secondary | ICD-10-CM | POA: Insufficient documentation

## 2022-04-23 DIAGNOSIS — W19XXXA Unspecified fall, initial encounter: Secondary | ICD-10-CM | POA: Insufficient documentation

## 2022-04-23 DIAGNOSIS — S069XAA Unspecified intracranial injury with loss of consciousness status unknown, initial encounter: Secondary | ICD-10-CM | POA: Insufficient documentation

## 2022-04-23 DIAGNOSIS — T07XXXA Unspecified multiple injuries, initial encounter: Secondary | ICD-10-CM

## 2022-04-23 DIAGNOSIS — S0990XA Unspecified injury of head, initial encounter: Secondary | ICD-10-CM | POA: Diagnosis present

## 2022-04-23 MED ORDER — HYDROCODONE-ACETAMINOPHEN 5-325 MG PO TABS: 1.0000 | ORAL_TABLET | Freq: Once | ORAL | Status: DC

## 2022-04-23 NOTE — ED Provider Triage Note (Signed)
  Emergency Medicine Provider Triage Evaluation Note  MRN:  [id number]  Arrival date & time: 04/23/22    Medically screening exam initiated at 11:30 PM.   CC:   Head Injury  HPI:  Maria Blackburn is a 51 y.o. year-old female presents to the ED with chief complaint of fall and head injury.  Slipped on water at work and fell backward and hit head on the concrete.  States she almost passed out.  Denies vomiting.  Complains of headache, right shoulder/elbow pain and left hand pain.  No anticoagulated.  History provided by patient. ROS:  -As included in HPI PE:   Vitals:   04/23/22 2158  BP: (!) 157/85  Pulse: 94  Resp: 20  Temp: 98.1 F (36.7 C)  SpO2: 100%    Non-toxic appearing No respiratory distress 3x3cm hematoma to posterior scalp MDM:  Based on signs and symptoms, hematoma is highest on my differential, followed by skull fx, ICH, concussion. I've ordered imaging in triage to expedite lab/diagnostic workup.  Patient was informed that the remainder of the evaluation will be completed by another provider, this initial triage assessment does not replace that evaluation, and the importance of remaining in the ED until their evaluation is complete.    Montine Circle, PA-C 04/23/22 2332

## 2022-04-23 NOTE — ED Triage Notes (Signed)
Pt reports fall hitting her head while at work. Denies LOC, +hematoma.

## 2022-04-24 ENCOUNTER — Emergency Department (HOSPITAL_COMMUNITY): Payer: 59

## 2022-04-24 MED ORDER — NAPROXEN 250 MG PO TABS
500.0000 mg | ORAL_TABLET | Freq: Once | ORAL | Status: AC
  Administered 2022-04-24: 500 mg via ORAL
  Filled 2022-04-24: qty 1

## 2022-04-24 MED ORDER — IBUPROFEN 600 MG PO TABS: 600.0000 mg | ORAL_TABLET | Freq: Three times a day (TID) | ORAL | 0 refills | Status: DC

## 2022-04-24 MED ORDER — ACETAMINOPHEN 500 MG PO TABS: 500.0000 mg | ORAL_TABLET | Freq: Four times a day (QID) | ORAL | 0 refills | Status: DC | PRN

## 2022-04-24 MED ORDER — ACETAMINOPHEN 325 MG PO TABS
650.0000 mg | ORAL_TABLET | Freq: Once | ORAL | Status: AC
  Administered 2022-04-24: 650 mg via ORAL
  Filled 2022-04-24: qty 2

## 2022-04-24 NOTE — Discharge Instructions (Addendum)
Fortunately, CT scan of your brain, C-spine are negative for any fracture, brain bleed. X-ray of your shoulder, elbow and hand are also negative for fractures.  You likely have multiple contusions. We recommend ice application over injured areas every 4-6 hours over the next 1 to 2 days.  Take ibuprofen for pain.

## 2022-04-24 NOTE — ED Notes (Signed)
Dc instructions and scripts reviewed with pt no questions or concerns at this time.  

## 2022-04-24 NOTE — ED Provider Notes (Signed)
[facility] EMERGENCY DEPARTMENT Provider Note   CSN: [id number] Arrival date & time: 04/23/22  2122     History  Chief Complaint  Patient presents with   Lytle Michaels    Maria Blackburn is a 51 y.o. female.  HPI     51 year old female comes in with chief complaint of fall. Patient had a mechanical fall while at work.  She indicates that she had turned, and put her foot in an area that was recently mopped, and she slipped and fell backwards.  She struck the back of her head onto concrete surface.  She also injured her left hand, right elbow in the process of falling down.  She was extremely woozy after the fall, felt like she was almost going to pass out.  She required assistance from her colleagues in getting up.  Subsequently she has been having pain over the back of her head.  She had a large area of swelling to the occiput to which she had applied ice for several hours.  She is having mild headaches at this point.  She denies any dizziness, lightheadedness, memory issues at the moment.  She is having aches over all of her body, but primarily elbow right, right shoulder, left hand.  Home Medications Prior to Admission medications   Medication Sig Start Date End Date Taking? Authorizing Provider  acetaminophen (TYLENOL) 500 MG tablet Take 1 tablet (500 mg total) by mouth every 6 (six) hours as needed. 04/24/22  Yes Varney Biles, MD  ibuprofen (ADVIL) 600 MG tablet Take 1 tablet (600 mg total) by mouth 3 (three) times daily. 04/24/22  Yes Varney Biles, MD  dimenhyDRINATE (DRAMAMINE) 50 MG tablet Take 50 mg by mouth every 8 (eight) hours as needed.    [provider]  famotidine (PEPCID) 20 MG tablet Take 1 tablet (20 mg total) by mouth 2 (two) times daily. 03/04/18   Khatri, Hina, PA-C  hydrOXYzine (ATARAX/VISTARIL) 10 MG tablet Take 10 mg at bedtime by mouth. 04/07/17   [provider]  loratadine (CLARITIN) 10 MG tablet Take 10 mg by mouth daily as  needed for allergies.    [provider]  meclizine (ANTIVERT) 12.5 MG tablet Take 1 tablet (12.5 mg total) by mouth 3 (three) times daily as needed for dizziness. 03/03/18   Valarie Merino, MD  naproxen sodium (ALEVE) 220 MG tablet Take 440 mg by mouth 2 (two) times daily as needed (pain).    [provider]  ondansetron (ZOFRAN ODT) 4 MG disintegrating tablet Take 1 tablet (4 mg total) by mouth every 8 (eight) hours as needed for nausea or vomiting. Patient not taking: Reported on 09/10/2021 03/04/18   Delia Heady, PA-C  ondansetron (ZOFRAN) 4 MG tablet Take 1 tablet (4 mg total) by mouth every 6 (six) hours. Patient not taking: Reported on 09/10/2021 04/14/19   Etter Sjogren, PA-C  predniSONE (DELTASONE) 20 MG tablet Take 2 tablets (40 mg total) by mouth daily with breakfast. Patient not taking: Reported on 09/10/2021 04/14/19   Kendrick, Caitlyn S, PA-C  sertraline (ZOLOFT) 100 MG tablet Take 150 mg by mouth at bedtime.     [provider]      Allergies    Patient has no known allergies.    Review of Systems   Review of Systems  All other systems reviewed and are negative.   Physical Exam Updated Vital Signs BP (!) 146/82 (BP Location: Left Arm)   Pulse 85   Temp  98.1 F (36.7 C) (Oral)   Resp 16   SpO2 99%  Physical Exam Vitals and nursing note reviewed.  Constitutional:      Appearance: She is well-developed.  HENT:     Head: Atraumatic.  Eyes:     Extraocular Movements: Extraocular movements intact.     Pupils: Pupils are equal, round, and reactive to light.  Cardiovascular:     Rate and Rhythm: Normal rate.  Pulmonary:     Effort: Pulmonary effort is normal.  Musculoskeletal:     Cervical back: Normal range of motion and neck supple.     Comments: Head to toe evaluation shows small hematoma over the occiput.  Right elbow has erythema, mild edema.  Range of motion elbow and wrist is normal bilaterally  no bleeding of the scalp, no  facial abrasions, no spine step offs, crepitus of the chest or neck, no tenderness to palpation of the bilateral upper and lower extremities, no gross deformities, no chest tenderness, no pelvic pain.   Skin:    General: Skin is warm and dry.  Neurological:     Mental Status: She is alert and oriented to person, place, and time.     ED Results / Procedures / Treatments   Labs (all labs ordered are listed, but only abnormal results are displayed) Labs Reviewed - No data to display  EKG None  Radiology CT HEAD WO CONTRAST ( )  Result Date: 04/24/2022 CLINICAL DATA:  Head trauma, moderate to severe EXAM: CT HEAD WITHOUT CONTRAST CT CERVICAL SPINE WITHOUT CONTRAST TECHNIQUE: Multidetector CT imaging of the head and cervical spine was performed following the standard protocol without intravenous contrast. Multiplanar CT image reconstructions of the cervical spine were also generated. RADIATION DOSE REDUCTION: This exam was performed according to the departmental dose-optimization program which includes automated exposure control, adjustment of the mA and/or kV according to patient size and/or use of iterative reconstruction technique. COMPARISON:  MRI brain 05/03/2018. CT head 03/03/2018. CT cervical spine 01/14/2011 FINDINGS: CT HEAD FINDINGS Brain: No evidence of acute infarction, hemorrhage, hydrocephalus, extra-axial collection or mass lesion/mass effect. Vascular: No hyperdense vessel or unexpected calcification. Skull: Normal. Negative for fracture or focal lesion. Sinuses/Orbits: No acute finding. Other: None. CT CERVICAL SPINE FINDINGS Alignment: Reversal of the usual cervical lordosis without anterior subluxation. This is likely positional but could indicate muscle spasm. Normal alignment of the posterior elements. Skull base and vertebrae: No acute fracture. No primary bone lesion or focal pathologic process. Soft tissues and spinal canal: No prevertebral fluid or swelling. No visible  canal hematoma. Disc levels: Degenerative changes with disc space narrowing and endplate osteophyte formation throughout. Mild progression since prior study. Upper chest: Lung apices are clear. Other: None. IMPRESSION: 1. No acute intracranial abnormalities. 2. Nonspecific reversal of the usual cervical lordosis. No acute displaced fractures are identified. Electronically Signed   By: Burman Nieves M.D.   On: 04/24/2022 03:17   CT Cervical Spine Wo Contrast  Result Date: 04/24/2022 CLINICAL DATA:  Head trauma, moderate to severe EXAM: CT HEAD WITHOUT CONTRAST CT CERVICAL SPINE WITHOUT CONTRAST TECHNIQUE: Multidetector CT imaging of the head and cervical spine was performed following the standard protocol without intravenous contrast. Multiplanar CT image reconstructions of the cervical spine were also generated. RADIATION DOSE REDUCTION: This exam was performed according to the departmental dose-optimization program which includes automated exposure control, adjustment of the mA and/or kV according to patient size and/or use of iterative reconstruction technique. COMPARISON:  MRI brain 05/03/2018. CT head  03/03/2018. CT cervical spine 01/14/2011 FINDINGS: CT HEAD FINDINGS Brain: No evidence of acute infarction, hemorrhage, hydrocephalus, extra-axial collection or mass lesion/mass effect. Vascular: No hyperdense vessel or unexpected calcification. Skull: Normal. Negative for fracture or focal lesion. Sinuses/Orbits: No acute finding. Other: None. CT CERVICAL SPINE FINDINGS Alignment: Reversal of the usual cervical lordosis without anterior subluxation. This is likely positional but could indicate muscle spasm. Normal alignment of the posterior elements. Skull base and vertebrae: No acute fracture. No primary bone lesion or focal pathologic process. Soft tissues and spinal canal: No prevertebral fluid or swelling. No visible canal hematoma. Disc levels: Degenerative changes with disc space narrowing and  endplate osteophyte formation throughout. Mild progression since prior study. Upper chest: Lung apices are clear. Other: None. IMPRESSION: 1. No acute intracranial abnormalities. 2. Nonspecific reversal of the usual cervical lordosis. No acute displaced fractures are identified. Electronically Signed   By: Burman Nieves M.D.   On: 04/24/2022 03:17   DG Hand Complete Left  Result Date: 04/23/2022 CLINICAL DATA:  Fall EXAM: LEFT HAND - COMPLETE 3+ VIEW COMPARISON:  None Available. FINDINGS: There is no evidence of fracture or dislocation. There is no evidence of arthropathy or other focal bone abnormality. Soft tissues are unremarkable. IMPRESSION: Negative. Electronically Signed   By: Jasmine Pang M.D.   On: 04/23/2022 23:59   DG Shoulder Right  Result Date: 04/23/2022 CLINICAL DATA:  Fall EXAM: RIGHT SHOULDER - 2+ VIEW COMPARISON:  None Available. FINDINGS: No fracture or malalignment. Mild AC joint degenerative change. Right apex is clear IMPRESSION: No acute osseous abnormality Electronically Signed   By: Jasmine Pang M.D.   On: 04/23/2022 23:59   DG Elbow Complete Right  Result Date: 04/23/2022 CLINICAL DATA:  Fall EXAM: RIGHT ELBOW - COMPLETE 3+ VIEW COMPARISON:  None Available. FINDINGS: There is no evidence of fracture, dislocation, or joint effusion. There is no evidence of arthropathy or other focal bone abnormality. Soft tissues are unremarkable. IMPRESSION: Negative. Electronically Signed   By: Jasmine Pang M.D.   On: 04/23/2022 23:58    Procedures Procedures    Medications Ordered in ED Medications  HYDROcodone-acetaminophen (NORCO/VICODIN) 5-325 MG per tablet 1 tablet (has no administration in time range)  naproxen (NAPROSYN) tablet 500 mg (500 mg Oral Given 04/24/22 1229)  acetaminophen (TYLENOL) tablet 650 mg (650 mg Oral Given 04/24/22 1229)    ED Course/ Medical Decision Making/ A&P                           Medical Decision Making 51 year old female comes in  with chief complaint of fall.  Fall occurred yesterday.  She is having mild headache at this moment, has a hematoma over the occiput.  She is also having pain over her left hand and forearm with normal range of motion of the wrist and right elbow, with normal range of motion of the elbow with flexion and extension.  She also has shoulder discomfort with no compromise in range of motion of either of her shoulders.  Differential diagnosis includes multiple contusions, hematoma.  She had significant headache at arrival with questionable LOC.  CT scan of the brain and cervical spine were ordered.  No evidence of brain bleed appreciated.  Clinically does not appear that she is having concussion at this time as she does not have any other lingering symptoms besides headache.   I have independently interpreted x-rays of shoulder, elbow and hand, no evidence of fracture.  On  exam she did not have difficulty with range of motion of either of those anatomical regions, therefore I do not think she needs splinting.  We will prescribe her NSAID, Tylenol.  She is fortunately off this weekend and on Monday.  Advised PCP follow-up if the symptoms continue beyond 3 to 5 days.  Problems Addressed: Hematoma: self-limited or minor problem Multiple contusions: acute illness or injury Traumatic brain injury, with unknown loss of consciousness status, initial encounter [facility]): acute illness or injury  Risk OTC drugs. Prescription drug management.     Final Clinical Impression(s) / ED Diagnoses Final diagnoses:  Hematoma  Multiple contusions  Work related injury  Traumatic brain injury, with unknown loss of consciousness status, initial encounter (HCC)    Rx / DC Orders ED Discharge Orders          Ordered    ibuprofen (ADVIL) 600 MG tablet  3 times daily        04/24/22 1217    acetaminophen (TYLENOL) 500 MG tablet  Every 6 hours PRN        04/24/22 1217              Derwood Kaplan,  MD 04/24/22 1236

## 2022-09-03 DIAGNOSIS — Z419 Encounter for procedure for purposes other than remedying health state, unspecified: Secondary | ICD-10-CM | POA: Diagnosis not present

## 2022-10-04 DIAGNOSIS — Z419 Encounter for procedure for purposes other than remedying health state, unspecified: Secondary | ICD-10-CM | POA: Diagnosis not present

## 2022-12-04 DIAGNOSIS — Z419 Encounter for procedure for purposes other than remedying health state, unspecified: Secondary | ICD-10-CM | POA: Diagnosis not present

## 2023-02-02 ENCOUNTER — Ambulatory Visit (INDEPENDENT_AMBULATORY_CARE_PROVIDER_SITE_OTHER): Payer: Medicaid Other | Admitting: Family

## 2023-02-02 ENCOUNTER — Encounter: Payer: Self-pay | Admitting: Family

## 2023-02-02 VITALS — BP 165/99 | HR 82 | Temp 98.1°F | Ht 66.0 in | Wt 211.4 lb

## 2023-02-02 DIAGNOSIS — Z7689 Persons encountering health services in other specified circumstances: Secondary | ICD-10-CM

## 2023-02-02 DIAGNOSIS — M25512 Pain in left shoulder: Secondary | ICD-10-CM | POA: Diagnosis not present

## 2023-02-02 DIAGNOSIS — R030 Elevated blood-pressure reading, without diagnosis of hypertension: Secondary | ICD-10-CM | POA: Diagnosis not present

## 2023-02-02 DIAGNOSIS — M25511 Pain in right shoulder: Secondary | ICD-10-CM

## 2023-02-02 DIAGNOSIS — F419 Anxiety disorder, unspecified: Secondary | ICD-10-CM | POA: Diagnosis not present

## 2023-02-02 DIAGNOSIS — F32A Depression, unspecified: Secondary | ICD-10-CM | POA: Diagnosis not present

## 2023-02-02 DIAGNOSIS — M549 Dorsalgia, unspecified: Secondary | ICD-10-CM

## 2023-02-02 MED ORDER — KETOROLAC TROMETHAMINE 60 MG/2ML IM SOLN
60.0000 mg | Freq: Once | INTRAMUSCULAR | Status: AC
  Administered 2023-02-02: 60 mg via INTRAMUSCULAR

## 2023-02-02 MED ORDER — HYDROXYZINE PAMOATE 25 MG PO CAPS: 25.0000 mg | ORAL_CAPSULE | Freq: Three times a day (TID) | ORAL | 1 refills | Status: DC | PRN

## 2023-02-02 MED ORDER — SERTRALINE HCL 25 MG PO TABS: 25.0000 mg | ORAL_TABLET | Freq: Every day | ORAL | 1 refills | Status: DC

## 2023-02-02 MED ORDER — TRIAMCINOLONE ACETONIDE 40 MG/ML IJ SUSP
40.0000 mg | Freq: Once | INTRAMUSCULAR | Status: AC
  Administered 2023-02-02: 40 mg via INTRAMUSCULAR

## 2023-02-02 NOTE — Progress Notes (Signed)
Pt asking for anxiety and panic attacks medication she would like to get on some.  Pt asking for referral for shoulder, neck and back of head due to pain that she is having.

## 2023-02-02 NOTE — Progress Notes (Unsigned)
  Subjective:    Maria Blackburn - 52 y.o. female MRN [id number]  Date of birth: 1970-08-13  HPI  Maria Blackburn is to establish care.   Current issues and/or concerns: - Reports history of anxiety depression for "years". Reports in the past she was taking Sertraline and Hydroxyzine which helped. Reports anxiety depression primarily related to states she has a "special needs child", taking care of her nephew, husband sometimes causing her irritation, her father who served in the military in the past, moving to a new house, and menopause. Reports she was diagnosed with major depression in the past but doesn't feel she has this. Reports her anxiety is usually worse than her depression unless a significant life event happens which causes her depression to increase.     Pt asking for anxiety and panic attacks medication she would like to get on some.   Pt asking for referral for shoulder, neck and back of head due to pain that she is having.       ROS per HPI     Health Maintenance:  - *** Health Maintenance Due  Topic Date Due   Hepatitis C Screening  Never done   Colonoscopy  Never done   PAP SMEAR-Modifier  03/21/2019   DTaP/Tdap/Td (2 - Tdap) 07/06/2019   MAMMOGRAM  04/11/2020   Zoster Vaccines- Shingrix (1 of 2) Never done   COVID-19 Vaccine (1 - 2023-24 season) Never done     Past Medical History: Patient Active Problem List   Diagnosis Date Noted   De Quervain's tenosynovitis, left 09/10/2021   Lateral epicondylitis, right elbow 09/10/2021      Social History   reports that she has never smoked. She has never used smokeless tobacco. She reports that she does not drink alcohol and does not use drugs.   Family History  family history includes Diabetes in her son; Heart disease in her mother.   Medications: reviewed and updated   Objective:   Physical Exam BP (!) 165/99   Pulse 82   Temp 98.1 F (36.7 C) (Oral)   Ht 5\' 6"  (1.676 m)   Wt 211 lb 6.4 oz (95.9  kg)   SpO2 94%   BMI 34.12 kg/m  Physical Exam      Assessment & Plan:         Patient was given clear instructions to go to Emergency Department or return to medical center if symptoms don't improve, worsen, or new problems develop.The patient verbalized understanding.  I discussed the assessment and treatment plan with the patient. The patient was provided an opportunity to ask questions and all were answered. The patient agreed with the plan and demonstrated an understanding of the instructions.   The patient was advised to call back or seek an in-person evaluation if the symptoms worsen or if the condition fails to improve as anticipated.    Ricky Stabs, NP 02/02/2023, 4:07 PM Primary Care at [facility]

## 2023-02-02 NOTE — Patient Instructions (Signed)
Thank you for choosing Primary Care at [facility] for your medical home!    Maria Blackburn was seen by Rema Fendt, NP today.   Maria Blackburn's primary care provider is Ricky Stabs, NP.   For the best care possible,  you should try to see Ricky Stabs, NP whenever you come to office.   We look forward to seeing you again soon!  If you have any questions about your visit today,  please call us at [phone]  Or feel free to reach your provider via MyChart.   Keeping you healthy   Get these tests Blood pressure- Have your blood pressure checked once a year by your healthcare provider.  Normal blood pressure is 120/80. Weight- Have your body mass index (BMI) calculated to screen for obesity.  BMI is a measure of body fat based on height and weight. You can also calculate your own BMI at https://www.west-esparza.com/. Cholesterol- Have your cholesterol checked regularly starting at age 64, sooner may be necessary if you have diabetes, high blood pressure, if a family member developed heart diseases at an early age or if you smoke.  Chlamydia, HIV, and other sexual transmitted disease- Get screened each year until the age of 55 then within three months of each new sexual partner. Diabetes- Have your blood sugar checked regularly if you have high blood pressure, high cholesterol, a family history of diabetes or if you are overweight.   Get these vaccines Flu shot- Every fall. Tetanus shot- Every 10 years. Menactra- Single dose; prevents meningitis.   Take these steps Don't smoke- If you do smoke, ask your healthcare provider about quitting. For tips on how to quit, go to www.smokefree.gov or call 1-800-QUIT-NOW. Be physically active- Exercise 5 days a week for at least 30 minutes.  If you are not already physically active start slow and gradually work up to 30 minutes of moderate physical activity.  Examples of moderate activity include walking briskly, mowing the yard, dancing,  swimming bicycling, etc. Eat a healthy diet- Eat a variety of healthy foods such as fruits, vegetables, low fat milk, low fat cheese, yogurt, lean meats, poultry, fish, beans, tofu, etc.  For more information on healthy eating, go to www.thenutritionsource.org Drink alcohol in moderation- Limit alcohol intake two drinks or less a day.  Never drink and drive. Dentist- Brush and floss teeth twice daily; visit your dentis twice a year. Depression-Your emotional health is as important as your physical health.  If you're feeling down, losing interest in things you normally enjoy please talk with your healthcare provider. Gun Safety- If you keep a gun in your home, keep it unloaded and with the safety lock on.  Bullets should be stored separately. Helmet use- Always wear a helmet when riding a motorcycle, bicycle, rollerblading or skateboarding. Safe sex- If you may be exposed to a sexually transmitted infection, use a condom Seat belts- Seat bels can save your life; always wear one. Smoke/Carbon Monoxide detectors- These detectors need to be installed on the appropriate level of your home.  Replace batteries at least once a year. Skin Cancer- When out in the sun, cover up and use sunscreen SPF 15 or higher. Violence- If anyone is threatening or hurting you, please tell your healthcare provider.

## 2023-02-03 DIAGNOSIS — Z419 Encounter for procedure for purposes other than remedying health state, unspecified: Secondary | ICD-10-CM | POA: Diagnosis not present

## 2023-02-15 ENCOUNTER — Telehealth: Payer: Self-pay | Admitting: Family

## 2023-02-15 NOTE — Telephone Encounter (Signed)
Called pt to schedule appt for concerns listed via MyChart appt request; wanted to remind pt she already has an appt scheduled for 08/28. Was not able to reach pt but left a vm.

## 2023-03-02 ENCOUNTER — Ambulatory Visit (INDEPENDENT_AMBULATORY_CARE_PROVIDER_SITE_OTHER): Payer: Medicaid Other | Admitting: Family

## 2023-03-02 ENCOUNTER — Encounter: Payer: Self-pay | Admitting: Family

## 2023-03-02 VITALS — BP 134/89 | HR 83 | Temp 98.8°F | Ht 66.0 in | Wt 206.6 lb

## 2023-03-02 DIAGNOSIS — E669 Obesity, unspecified: Secondary | ICD-10-CM

## 2023-03-02 DIAGNOSIS — F32A Depression, unspecified: Secondary | ICD-10-CM

## 2023-03-02 DIAGNOSIS — F419 Anxiety disorder, unspecified: Secondary | ICD-10-CM

## 2023-03-02 DIAGNOSIS — Z7689 Persons encountering health services in other specified circumstances: Secondary | ICD-10-CM | POA: Diagnosis not present

## 2023-03-02 DIAGNOSIS — Z6833 Body mass index (BMI) 33.0-33.9, adult: Secondary | ICD-10-CM

## 2023-03-02 MED ORDER — SERTRALINE HCL 50 MG PO TABS: 50.0000 mg | ORAL_TABLET | Freq: Every day | ORAL | 1 refills | Status: DC

## 2023-03-02 MED ORDER — HYDROXYZINE PAMOATE 50 MG PO CAPS: 50.0000 mg | ORAL_CAPSULE | Freq: Three times a day (TID) | ORAL | 1 refills | Status: DC | PRN

## 2023-03-02 MED ORDER — PHENTERMINE HCL 15 MG PO CAPS: 15.0000 mg | ORAL_CAPSULE | ORAL | 0 refills | Status: DC

## 2023-03-02 NOTE — Progress Notes (Unsigned)
Patient ID: Maria Blackburn, female    DOB: [birth year]  MRN: [id number]  CC: Follow-Up  Subjective: Maria Blackburn is a 52 y.o. female who presents for follow-up.   Her concerns today include: ***  Patient Active Problem List   Diagnosis Date Noted   De Quervain's tenosynovitis, left 09/10/2021   Lateral epicondylitis, right elbow 09/10/2021     Current Outpatient Medications on File Prior to Visit  Medication Sig Dispense Refill   hydrOXYzine (VISTARIL) 25 MG capsule Take 1 capsule (25 mg total) by mouth every 8 (eight) hours as needed. 30 capsule 1   meclizine (ANTIVERT) 12.5 MG tablet Take 1 tablet (12.5 mg total) by mouth 3 (three) times daily as needed for dizziness. 30 tablet 0   sertraline (ZOLOFT) 100 MG tablet Take 150 mg by mouth at bedtime.     acetaminophen (TYLENOL) 500 MG tablet Take 1 tablet (500 mg total) by mouth every 6 (six) hours as needed. (Patient not taking: Reported on 03/02/2023) 10 tablet 0   dimenhyDRINATE (DRAMAMINE) 50 MG tablet Take 50 mg by mouth every 8 (eight) hours as needed. (Patient not taking: Reported on 03/02/2023)     famotidine (PEPCID) 20 MG tablet Take 1 tablet (20 mg total) by mouth 2 (two) times daily. (Patient not taking: Reported on 02/02/2023) 30 tablet 0   hydrOXYzine (ATARAX/VISTARIL) 10 MG tablet Take 10 mg at bedtime by mouth. (Patient not taking: Reported on 03/02/2023)  3   ibuprofen (ADVIL) 600 MG tablet Take 1 tablet (600 mg total) by mouth 3 (three) times daily. (Patient not taking: Reported on 03/02/2023) 9 tablet 0   loratadine (CLARITIN) 10 MG tablet Take 10 mg by mouth daily as needed for allergies. (Patient not taking: Reported on 03/02/2023)     naproxen sodium (ALEVE) 220 MG tablet Take 440 mg by mouth 2 (two) times daily as needed (pain). (Patient not taking: Reported on 02/02/2023)     ondansetron (ZOFRAN ODT) 4 MG disintegrating tablet Take 1 tablet (4 mg total) by mouth every 8 (eight) hours as needed for nausea or vomiting.  (Patient not taking: Reported on 09/10/2021) 3 tablet 0   ondansetron (ZOFRAN) 4 MG tablet Take 1 tablet (4 mg total) by mouth every 6 (six) hours. (Patient not taking: Reported on 09/10/2021) 12 tablet 0   predniSONE (DELTASONE) 20 MG tablet Take 2 tablets (40 mg total) by mouth daily with breakfast. (Patient not taking: Reported on 09/10/2021) 10 tablet 0   sertraline (ZOLOFT) 25 MG tablet Take 1 tablet (25 mg total) by mouth daily. (Patient not taking: Reported on 03/02/2023) 30 tablet 1   No current facility-administered medications on file prior to visit.    No Known Allergies  Social History   Socioeconomic History   Marital status: Married    Spouse name: Not on file   Number of children: Not on file   Years of education: Not on file   Highest education level: Not on file  Occupational History   Not on file  Tobacco Use   Smoking status: Never   Smokeless tobacco: Never  Substance and Sexual Activity   Alcohol use: No   Drug use: No   Sexual activity: Not on file  Other Topics Concern   Not on file  Social History Narrative   Not on file   Social Determinants of Health   Financial Resource Strain: Not on file  Food Insecurity: Not on file  Transportation Needs: Not on file  Physical  Activity: Not on file  Stress: Not on file  Social Connections: Not on file  Intimate Partner Violence: Not on file    Family History  Problem Relation Age of Onset   Heart disease Mother    Diabetes Son     Past Surgical History:  Procedure Laterality Date   ABDOMINAL HYSTERECTOMY  2000   ovaries remain   CYSTO WITH HYDRODISTENSION N/A 03/24/2017   Procedure: CYSTOSCOPY/HYDRODISTENSION INSTILLATION OF MARCAINE AND PYRIDIUM;  Surgeon: Bjorn Pippin, MD;  Location: [facility];  Service: Urology;  Laterality: N/A;   HYSTEROSCOPY WITH D & C  1999   POLYPECTOMY   TUBAL LIGATION Bilateral 1996    ROS: Review of Systems Negative except as stated above  PHYSICAL  EXAM: BP 134/89   Pulse 83   Temp 98.8 F (37.1 C) (Oral)   Ht 5\' 6"  (1.676 m)   Wt 206 lb 9.6 oz (93.7 kg)   SpO2 95%   BMI 33.35 kg/m   Physical Exam  {female adult master:310786} {female adult master:310785}     Latest Ref Rng & Units 03/04/2018    5:54 PM 03/03/2018   12:04 PM 05/20/2017    2:21 PM  CMP  Glucose 70 - 99 mg/dL 409  95  92   BUN 6 - 20 mg/dL 17  19  8    Creatinine 0.44 - 1.00 mg/dL 8.11  9.14  7.82   Sodium 135 - 145 mmol/L 142  143  138   Potassium 3.5 - 5.1 mmol/L 3.7  3.7  3.9   Chloride 98 - 111 mmol/L 105  102  105   CO2 22 - 32 mmol/L 29  30  27    Calcium 8.9 - 10.3 mg/dL 9.1  9.0  9.1   Total Protein 6.5 - 8.1 g/dL 6.3     Total Bilirubin 0.3 - 1.2 mg/dL 1.2     Alkaline Phos 38 - 126 U/L 45     AST 15 - 41 U/L 17     ALT 0 - 44 U/L 16      Lipid Panel     Component Value Date/Time   CHOL 176 03/20/2016 0856   TRIG 88 03/20/2016 0856   HDL 57 03/20/2016 0856   CHOLHDL 3.1 03/20/2016 0856   VLDL 18 03/20/2016 0856   LDLCALC 101 03/20/2016 0856    CBC    Component Value Date/Time   WBC 13.0 (H) 03/04/2018 1754   RBC 4.92 03/04/2018 1754   HGB 15.0 03/04/2018 1754   HCT 44.0 03/04/2018 1754   PLT 351 03/04/2018 1754   MCV 89.4 03/04/2018 1754   MCV 87.6 03/20/2016 0907   MCH 30.5 03/04/2018 1754   MCHC 34.1 03/04/2018 1754   RDW 13.4 03/04/2018 1754   LYMPHSABS 3.3 03/04/2018 1754   MONOABS 0.7 03/04/2018 1754   EOSABS 0.3 03/04/2018 1754   BASOSABS 0.0 03/04/2018 1754    ASSESSMENT AND PLAN:  There are no diagnoses linked to this encounter.   Patient was given the opportunity to ask questions.  Patient verbalized understanding of the plan and was able to repeat key elements of the plan. Patient was given clear instructions to go to Emergency Department or return to medical center if symptoms don't improve, worsen, or new problems develop.The patient verbalized understanding.   No orders of the defined types were placed in  this encounter.    Requested Prescriptions   Pending Prescriptions Disp Refills   sertraline (ZOLOFT) 50 MG  tablet 30 tablet 1    Sig: Take 1 tablet (50 mg total) by mouth daily.   hydrOXYzine (VISTARIL) 50 MG capsule 30 capsule 1    Sig: Take 1 capsule (50 mg total) by mouth every 8 (eight) hours as needed.   phentermine 15 MG capsule 30 capsule 0    Sig: Take 1 capsule (15 mg total) by mouth every morning.    Return in about 4 weeks (around 03/30/2023) for Follow-Up or next available.  Rema Fendt, NP

## 2023-03-06 DIAGNOSIS — Z419 Encounter for procedure for purposes other than remedying health state, unspecified: Secondary | ICD-10-CM | POA: Diagnosis not present

## 2023-03-24 ENCOUNTER — Ambulatory Visit: Payer: Self-pay

## 2023-03-24 NOTE — Telephone Encounter (Signed)
Summary: Potential bladder infection symptoms   Pt called reporting symptoms of potential bladder infection, no appt soon enough  Best contact:  [phone]      Called pt  - left message on machine to return our call.

## 2023-03-24 NOTE — Telephone Encounter (Signed)
Summary: Potential bladder infection symptoms   Pt called reporting symptoms of potential bladder infection, no appt soon enough  Best contact:  [phone]     Called pt - left message on machine to return call.

## 2023-03-24 NOTE — Telephone Encounter (Signed)
  Chief Complaint: Frequency, urgency, odor to urine Symptoms: above Frequency: Beginning of september Pertinent Negatives: Patient denies fever Disposition: [] ED /[] Urgent Care (no appt availability in office) / [x] Appointment(In office/virtual)/ []  [place] [facility]/ [] Home Care/ [] Refused Recommended Disposition /[] [facility]/ []  Follow-up with PCP Additional Notes: Pt reports ongoing urinary track issues. Pt recently had a UTI which resolved with ABX, but returned quickly.   Summary: Potential bladder infection symptoms   Pt called reporting symptoms of potential bladder infection, no appt soon enough  Best contact:  [phone]     Reason for Disposition  Side (flank) or lower back pain present  Answer Assessment - Initial Assessment Questions 1. SYMPTOM: "What's the main symptom you're concerned about?" (e.g., frequency, incontinence)     Frequency - odor, urgency 2. ONSET early september 3. PAIN: "Is there any pain?" If Yes, ask: "How bad is it?" (Scale: 1-10; mild, moderate, severe)     yes 4. CAUSE: "What do you think is causing the symptoms?"     UTI 5. OTHER SYMPTOMS: "Do you have any other symptoms?" (e.g., blood in urine, fever, flank pain, pain with urination)     Pain with urination - hesitancy  Protocols used: Urinary Symptoms-A-AH

## 2023-03-25 ENCOUNTER — Ambulatory Visit (INDEPENDENT_AMBULATORY_CARE_PROVIDER_SITE_OTHER): Payer: Medicaid Other | Admitting: Family Medicine

## 2023-03-25 ENCOUNTER — Encounter: Payer: Self-pay | Admitting: Family Medicine

## 2023-03-25 VITALS — BP 138/87 | HR 85 | Temp 98.1°F | Resp 16 | Wt 203.0 lb

## 2023-03-25 DIAGNOSIS — N3000 Acute cystitis without hematuria: Secondary | ICD-10-CM | POA: Diagnosis not present

## 2023-03-25 LAB — POCT URINALYSIS DIP (CLINITEK)
Blood, UA: NEGATIVE
Glucose, UA: 100 mg/dL — AB
Leukocytes, UA: NEGATIVE
Nitrite, UA: POSITIVE — AB
POC PROTEIN,UA: 30 — AB
Spec Grav, UA: >=1.03 — AB (ref 1.010–1.025)
Urobilinogen, UA: 1 E.U./dL
pH, UA: 6 (ref 5.0–8.0)

## 2023-03-25 MED ORDER — NITROFURANTOIN MONOHYD MACRO 100 MG PO CAPS: 100.0000 mg | ORAL_CAPSULE | Freq: Two times a day (BID) | ORAL | 0 refills | Status: DC

## 2023-03-29 ENCOUNTER — Encounter: Payer: Self-pay | Admitting: Family Medicine

## 2023-03-29 NOTE — Progress Notes (Signed)
Established Patient Office Visit  Subjective    Patient ID: Maria Blackburn, female    DOB: [birth year]  Age: 52 y.o. MRN: [id number]  CC:  Chief Complaint  Patient presents with   Urinary Tract Infection    HPI Maria Blackburn presents with dysuria.   Outpatient Encounter Medications as of 03/25/2023  Medication Sig   nitrofurantoin, macrocrystal-monohydrate, (MACROBID) 100 MG capsule Take 1 capsule (100 mg total) by mouth 2 (two) times daily.   acetaminophen (TYLENOL) 500 MG tablet Take 1 tablet (500 mg total) by mouth every 6 (six) hours as needed. (Patient not taking: Reported on 03/02/2023)   dimenhyDRINATE (DRAMAMINE) 50 MG tablet Take 50 mg by mouth every 8 (eight) hours as needed. (Patient not taking: Reported on 03/02/2023)   famotidine (PEPCID) 20 MG tablet Take 1 tablet (20 mg total) by mouth 2 (two) times daily. (Patient not taking: Reported on 02/02/2023)   hydrOXYzine (VISTARIL) 50 MG capsule Take 1 capsule (50 mg total) by mouth every 8 (eight) hours as needed.   ibuprofen (ADVIL) 600 MG tablet Take 1 tablet (600 mg total) by mouth 3 (three) times daily. (Patient not taking: Reported on 03/02/2023)   loratadine (CLARITIN) 10 MG tablet Take 10 mg by mouth daily as needed for allergies. (Patient not taking: Reported on 03/02/2023)   meclizine (ANTIVERT) 12.5 MG tablet Take 1 tablet (12.5 mg total) by mouth 3 (three) times daily as needed for dizziness.   naproxen sodium (ALEVE) 220 MG tablet Take 440 mg by mouth 2 (two) times daily as needed (pain). (Patient not taking: Reported on 02/02/2023)   ondansetron (ZOFRAN ODT) 4 MG disintegrating tablet Take 1 tablet (4 mg total) by mouth every 8 (eight) hours as needed for nausea or vomiting. (Patient not taking: Reported on 09/10/2021)   phentermine 15 MG capsule Take 1 capsule (15 mg total) by mouth every morning.   sertraline (ZOLOFT) 100 MG tablet Take 150 mg by mouth at bedtime.   sertraline (ZOLOFT) 50 MG tablet Take 1 tablet (50 mg  total) by mouth daily.   [DISCONTINUED] hydrOXYzine (ATARAX/VISTARIL) 10 MG tablet Take 10 mg at bedtime by mouth. (Patient not taking: Reported on 03/02/2023)   [DISCONTINUED] ondansetron (ZOFRAN) 4 MG tablet Take 1 tablet (4 mg total) by mouth every 6 (six) hours. (Patient not taking: Reported on 09/10/2021)   [DISCONTINUED] predniSONE (DELTASONE) 20 MG tablet Take 2 tablets (40 mg total) by mouth daily with breakfast. (Patient not taking: Reported on 09/10/2021)   No facility-administered encounter medications on file as of 03/25/2023.    Past Medical History:  Diagnosis Date   Anxiety    Bladder spasms    Frequency of urination    GERD (gastroesophageal reflux disease)    Interstitial cystitis    Pelvic pain    SUI (stress urinary incontinence, female)    Urgency of urination     Past Surgical History:  Procedure Laterality Date   ABDOMINAL HYSTERECTOMY  2000   ovaries remain   CYSTO WITH HYDRODISTENSION N/A 03/24/2017   Procedure: CYSTOSCOPY/HYDRODISTENSION INSTILLATION OF MARCAINE AND PYRIDIUM;  Surgeon: Bjorn Pippin, MD;  Location: [facility]  Service: Urology;  Laterality: N/A;   HYSTEROSCOPY WITH D & C  1999   POLYPECTOMY   TUBAL LIGATION Bilateral 1996    Family History  Problem Relation Age of Onset   Heart disease Mother    Diabetes Son     Social History   Socioeconomic History   Marital status:  Married    Spouse name: Not on file   Number of children: Not on file   Years of education: Not on file   Highest education level: Not on file  Occupational History   Not on file  Tobacco Use   Smoking status: Never   Smokeless tobacco: Never  Substance and Sexual Activity   Alcohol use: No   Drug use: No   Sexual activity: Not on file  Other Topics Concern   Not on file  Social History Narrative   Not on file   Social Determinants of Health   Financial Resource Strain: Low Risk  (03/25/2023)   Overall Financial Resource Strain (CARDIA)     Difficulty of Paying Living Expenses: Not very hard  Food Insecurity: Patient Unable To Answer (03/25/2023)   Hunger Vital Sign    Worried About Running Out of Food in the Last Year: Patient unable to answer    Ran Out of Food in the Last Year: Patient unable to answer  Transportation Needs: Patient Unable To Answer (03/25/2023)   PRAPARE - Transportation    Lack of Transportation (Medical): Patient unable to answer    Lack of Transportation (Non-Medical): Patient unable to answer  Physical Activity: Patient Unable To Answer (03/25/2023)   Exercise Vital Sign    Days of Exercise per Week: Patient unable to answer    Minutes of Exercise per Session: Patient unable to answer  Stress: Patient Unable To Answer (03/25/2023)   [employer] of Occupational Health - Occupational Stress Questionnaire    Feeling of Stress : Patient unable to answer  Social Connections: Patient Unable To Answer (03/25/2023)   Social Connection and Isolation Panel [NHANES]    Frequency of Communication with Friends and Family: Patient unable to answer    Frequency of Social Gatherings with Friends and Family: Patient unable to answer    Attends Religious Services: Patient unable to answer    Active Member of Clubs or Organizations: Patient unable to answer    Attends Banker Meetings: Patient unable to answer    Marital Status: Patient unable to answer  Intimate Partner Violence: Patient Unable To Answer (03/25/2023)   Humiliation, Afraid, Rape, and Kick questionnaire    Fear of Current or Ex-Partner: Patient unable to answer    Emotionally Abused: Patient unable to answer    Physically Abused: Patient unable to answer    Sexually Abused: Patient unable to answer    Review of Systems  Genitourinary:  Positive for dysuria.  All other systems reviewed and are negative.       Objective    BP 138/87   Pulse 85   Temp 98.1 F (36.7 C) (Oral)   Resp 16   Wt 203 lb (92.1 kg)   SpO2 93%   BMI  32.77 kg/m   Physical Exam Vitals and nursing note reviewed.  Constitutional:      General: She is not in acute distress. Cardiovascular:     Rate and Rhythm: Normal rate and regular rhythm.  Pulmonary:     Effort: Pulmonary effort is normal.     Breath sounds: Normal breath sounds.  Abdominal:     Palpations: Abdomen is soft.     Tenderness: There is abdominal tenderness.  Neurological:     General: No focal deficit present.     Mental Status: She is alert and oriented to person, place, and time.         Assessment & Plan:   Acute  cystitis without hematuria -     POCT URINALYSIS DIP (CLINITEK)  Other orders -     Nitrofurantoin Monohyd Macro; Take 1 capsule (100 mg total) by mouth 2 (two) times daily.  Dispense: 10 capsule; Refill: 0     Return if symptoms worsen or fail to improve.   Tommie Raymond, MD

## 2023-03-30 ENCOUNTER — Ambulatory Visit: Payer: Self-pay | Admitting: *Deleted

## 2023-03-30 ENCOUNTER — Other Ambulatory Visit: Payer: Self-pay | Admitting: Family Medicine

## 2023-03-30 MED ORDER — SULFAMETHOXAZOLE-TRIMETHOPRIM 800-160 MG PO TABS: 1.0000 | ORAL_TABLET | Freq: Two times a day (BID) | ORAL | 0 refills | Status: DC

## 2023-03-30 NOTE — Telephone Encounter (Signed)
Message from [place] T sent at 03/30/2023  1:06 PM EDT  Summary: UTI syptoms   Patient called stated she is still experiencing UTI symptoms after taking the medication. She took her last dosage lastnight. She is aware of her appt on 9/30 but would like to see the provider before then but no appts before 10/14. Please f/u with patient          Call History  Contact Date/Time Type Contact Phone/Fax User  03/30/2023 01:04 PM EDT Phone (Incoming) Maria Blackburn, Maria Blackburn (Self) [phone] Judie Petit) Elon Jester   Reason for Disposition  [1] Taking antibiotic > 72 hours (3 days) for UTI AND [2] painful urination or frequency is SAME (unchanged, not better)  Answer Assessment - Initial Assessment Questions 1. SEVERITY: "How bad is the pain?"  (e.g., Scale 1-10; mild, moderate, or severe)   - MILD (1-3): complains slightly about urination hurting   - MODERATE (4-7): interferes with normal activities     - SEVERE (8-10): excruciating, unwilling or unable to urinate because of the pain      My UTI symptoms never went away even after the antibiotic.   I was seen on 03/25/2023 by Dr. Andrey Campanile.  She prescribed Macrobid.   I'm wanting to have a referral to a urologist.   I'm getting these UTIs a lot now. Amoxicillin does not work.   2. FREQUENCY: "How many times have you had painful urination today?"      I burn with urination.  I have urgency.   I'm going very frequently.   3. PATTERN: "Is pain present every time you urinate or just sometimes?"      Yes 4. ONSET: "When did the painful urination start?"      My symptoms never went away with the antibiotic. 5. FEVER: "Do you have a fever?" If Yes, ask: "What is your temperature, how was it measured, and when did it start?"     Not asked 6. PAST UTI: "Have you had a urine infection before?" If Yes, ask: "When was the last time?" and "What happened that time?"      Yes   I'm wanting a referral to a urologist.   My insurance will not cover me at [facility]  so I need to go somewhere else. 7. CAUSE: "What do you think is causing the painful urination?"  (e.g., UTI, scratch, Herpes sore)     UTI 8. OTHER SYMPTOMS: "Do you have any other symptoms?" (e.g., blood in urine, flank pain, genital sores, urgency, vaginal discharge)     All the same symptoms I had when I came in to see Dr. Andrey Campanile. 9. PREGNANCY: "Is there any chance you are pregnant?" "When was your last menstrual period?"     Not asked  Answer Assessment - Initial Assessment Questions 1. MAIN SYMPTOM: "What is the main symptom you are concerned about?" (e.g., painful urination, urine frequency)     Seen on 03/25/2023 by Dr. Andrey Campanile and prescribed Macrobid.   All of my same symptoms are still there 2. BETTER-SAME-WORSE: "Are you getting better, staying the same, or getting worse compared to how you felt at your last visit to the doctor (most recent medical visit)?"     The same.   I took my last antibiotic last night.   I'm having urgency, frequency, and pain with urination. 3. PAIN: "How bad is the pain?"  (e.g., Scale 1-10; mild, moderate, or severe)   - MILD (1-3): complains slightly about urination hurting   -  MODERATE (4-7): interferes with normal activities     - SEVERE (8-10): excruciating, unwilling or unable to urinate because of the pain      Mild burning 4. FEVER: "Do you have a fever?" If Yes, ask: "What is it, how was it measured, and when did it start?"     No 5. OTHER SYMPTOMS: "Do you have any other symptoms?" (e.g., blood in the urine, flank pain, vaginal discharge)     "Same symptoms I had when I was seen"   They never went away. 6. DIAGNOSIS: "When was the UTI diagnosed?" "By whom?" "Was it a kidney infection, bladder infection or both?"     03/25/2023 by Dr. Andrey Campanile   Put me on Macrobid 7. ANTIBIOTIC: "What antibiotic(s) are you taking?" "How many times per day?"     I took my last Macrobid last night 8. ANTIBIOTIC - START DATE: "When did you start taking the  antibiotic?"     03/25/2023.  Protocols used: Urination Pain - Female-A-AH, Urinary Tract Infection on Antibiotic Follow-up Call - [facility]

## 2023-03-30 NOTE — Telephone Encounter (Signed)
  Chief Complaint: UTI symptoms never got any better on the Macrobid.   Saw Dr. Andrey Campanile on 03/25/2023 for UTI. Symptoms: frequency, urgency, painful urination.   All the same symptoms I had. Frequency: The symptoms never went away or got better Pertinent Negatives: Patient denies having any relief from the Macrobid Disposition: [] ED /[] Urgent Care (no appt availability in office) / [] Appointment(In office/virtual)/ []  [place] [facility]/ [] Home Care/ [] Refused Recommended Disposition /[] [facility]/ [x]  Follow-up with PCP Additional Notes: She has an appt coming up with Ricky Stabs, NP on 04/04/2023 at 3:40.   I've sent a note to Dr Andrey Campanile to see if she would be wiling to call in another antibiotic for her.   Amoxicillin does not help per pt.   Send to [facility] on [address].

## 2023-04-02 ENCOUNTER — Other Ambulatory Visit: Payer: Self-pay | Admitting: Family

## 2023-04-02 DIAGNOSIS — Z7689 Persons encountering health services in other specified circumstances: Secondary | ICD-10-CM

## 2023-04-02 DIAGNOSIS — Z6833 Body mass index (BMI) 33.0-33.9, adult: Secondary | ICD-10-CM

## 2023-04-04 ENCOUNTER — Encounter: Payer: Self-pay | Admitting: Family

## 2023-04-04 ENCOUNTER — Other Ambulatory Visit (HOSPITAL_COMMUNITY)
Admission: RE | Admit: 2023-04-04 | Discharge: 2023-04-04 | Disposition: A | Discharge status: Home / Self Care | Payer: Medicaid Other | Source: Ambulatory Visit | Attending: Family | Admitting: Family

## 2023-04-04 ENCOUNTER — Other Ambulatory Visit: Payer: Self-pay | Admitting: Family

## 2023-04-04 ENCOUNTER — Ambulatory Visit (INDEPENDENT_AMBULATORY_CARE_PROVIDER_SITE_OTHER): Payer: Medicaid Other | Admitting: Family

## 2023-04-04 VITALS — BP 140/86 | HR 80 | Temp 98.3°F | Ht 66.0 in | Wt 200.4 lb

## 2023-04-04 DIAGNOSIS — F32A Depression, unspecified: Secondary | ICD-10-CM | POA: Diagnosis not present

## 2023-04-04 DIAGNOSIS — Z7689 Persons encountering health services in other specified circumstances: Secondary | ICD-10-CM | POA: Diagnosis not present

## 2023-04-04 DIAGNOSIS — F419 Anxiety disorder, unspecified: Secondary | ICD-10-CM | POA: Diagnosis not present

## 2023-04-04 DIAGNOSIS — E669 Obesity, unspecified: Secondary | ICD-10-CM | POA: Diagnosis not present

## 2023-04-04 DIAGNOSIS — R350 Frequency of micturition: Secondary | ICD-10-CM | POA: Insufficient documentation

## 2023-04-04 DIAGNOSIS — Z6832 Body mass index (BMI) 32.0-32.9, adult: Secondary | ICD-10-CM | POA: Diagnosis not present

## 2023-04-04 LAB — POCT URINALYSIS DIP (CLINITEK)
Blood, UA: NEGATIVE
Glucose, UA: NEGATIVE mg/dL
Ketones, POC UA: NEGATIVE mg/dL
Leukocytes, UA: NEGATIVE
Nitrite, UA: POSITIVE — AB
POC PROTEIN,UA: 30 — AB
Spec Grav, UA: 1.025 (ref 1.010–1.025)
Urobilinogen, UA: 1 U/dL
pH, UA: 6 (ref 5.0–8.0)

## 2023-04-04 MED ORDER — HYDROXYZINE PAMOATE 50 MG PO CAPS: 50.0000 mg | ORAL_CAPSULE | Freq: Three times a day (TID) | ORAL | 2 refills | Status: DC | PRN

## 2023-04-04 MED ORDER — PHENTERMINE HCL 30 MG PO CAPS: 30.0000 mg | ORAL_CAPSULE | ORAL | 0 refills | Status: DC

## 2023-04-04 MED ORDER — SERTRALINE HCL 50 MG PO TABS: 50.0000 mg | ORAL_TABLET | Freq: Every day | ORAL | 2 refills | Status: DC

## 2023-04-04 NOTE — Progress Notes (Signed)
Patient ID: Maria Blackburn, female    DOB: 1971/06/18  MRN: [id number]  CC: Follow-Up  Subjective: Maria Blackburn is a 52 y.o. female who presents for follow-up.  Her concerns today include:  - Doing well on Sertraline and Hydroxyzine, no issues/concerns. She denies thoughts of self-harm, suicidal ideations, homicidal ideations. - Doing well on Phentermine, no issues/concerns.  - Frequent urination persisting. She denies additional symptoms. Requests referral to specialist for evaluation.  Patient Active Problem List   Diagnosis Date Noted   De Quervain's tenosynovitis, left 09/10/2021   Lateral epicondylitis, right elbow 09/10/2021   Allergy to environmental factors 10/07/2017     Current Outpatient Medications on File Prior to Visit  Medication Sig Dispense Refill   phentermine 15 MG capsule Take 1 capsule (15 mg total) by mouth every morning. 30 capsule 0   sulfamethoxazole-trimethoprim (BACTRIM DS) 800-160 MG tablet Take 1 tablet by mouth 2 (two) times daily. (Patient not taking: Reported on 04/04/2023) 6 tablet 0   acetaminophen (TYLENOL) 500 MG tablet Take 1 tablet (500 mg total) by mouth every 6 (six) hours as needed. (Patient not taking: Reported on 03/02/2023) 10 tablet 0   dimenhyDRINATE (DRAMAMINE) 50 MG tablet Take 50 mg by mouth every 8 (eight) hours as needed. (Patient not taking: Reported on 03/02/2023)     famotidine (PEPCID) 20 MG tablet Take 1 tablet (20 mg total) by mouth 2 (two) times daily. (Patient not taking: Reported on 02/02/2023) 30 tablet 0   ibuprofen (ADVIL) 600 MG tablet Take 1 tablet (600 mg total) by mouth 3 (three) times daily. (Patient not taking: Reported on 03/02/2023) 9 tablet 0   loratadine (CLARITIN) 10 MG tablet Take 10 mg by mouth daily as needed for allergies. (Patient not taking: Reported on 03/02/2023)     meclizine (ANTIVERT) 12.5 MG tablet Take 1 tablet (12.5 mg total) by mouth 3 (three) times daily as needed for dizziness. 30 tablet 0   naproxen  sodium (ALEVE) 220 MG tablet Take 440 mg by mouth 2 (two) times daily as needed (pain). (Patient not taking: Reported on 02/02/2023)     nitrofurantoin, macrocrystal-monohydrate, (MACROBID) 100 MG capsule Take 1 capsule (100 mg total) by mouth 2 (two) times daily. 10 capsule 0   ondansetron (ZOFRAN ODT) 4 MG disintegrating tablet Take 1 tablet (4 mg total) by mouth every 8 (eight) hours as needed for nausea or vomiting. (Patient not taking: Reported on 09/10/2021) 3 tablet 0   sertraline (ZOLOFT) 100 MG tablet Take 150 mg by mouth at bedtime. (Patient not taking: Reported on 04/04/2023)     No current facility-administered medications on file prior to visit.    No Known Allergies  Social History   Socioeconomic History   Marital status: Married    Spouse name: Not on file   Number of children: Not on file   Years of education: Not on file   Highest education level: Not on file  Occupational History   Not on file  Tobacco Use   Smoking status: Never   Smokeless tobacco: Never  Substance and Sexual Activity   Alcohol use: No   Drug use: No   Sexual activity: Not on file  Other Topics Concern   Not on file  Social History Narrative   Not on file   Social Determinants of Health   Financial Resource Strain: Low Risk  (03/25/2023)   Overall Financial Resource Strain (CARDIA)    Difficulty of Paying Living Expenses: Not very hard  Food Insecurity: Patient Unable To Answer (03/25/2023)   Hunger Vital Sign    Worried About Running Out of Food in the Last Year: Patient unable to answer    Ran Out of Food in the Last Year: Patient unable to answer  Transportation Needs: Patient Unable To Answer (03/25/2023)   PRAPARE - Transportation    Lack of Transportation (Medical): Patient unable to answer    Lack of Transportation (Non-Medical): Patient unable to answer  Physical Activity: Patient Unable To Answer (03/25/2023)   Exercise Vital Sign    Days of Exercise per Week: Patient unable to  answer    Minutes of Exercise per Session: Patient unable to answer  Stress: Patient Unable To Answer (03/25/2023)   [employer] of Occupational Health - Occupational Stress Questionnaire    Feeling of Stress : Patient unable to answer  Social Connections: Patient Unable To Answer (03/25/2023)   Social Connection and Isolation Panel [NHANES]    Frequency of Communication with Friends and Family: Patient unable to answer    Frequency of Social Gatherings with Friends and Family: Patient unable to answer    Attends Religious Services: Patient unable to answer    Active Member of Clubs or Organizations: Patient unable to answer    Attends Banker Meetings: Patient unable to answer    Marital Status: Patient unable to answer  Intimate Partner Violence: Patient Unable To Answer (03/25/2023)   Humiliation, Afraid, Rape, and Kick questionnaire    Fear of Current or Ex-Partner: Patient unable to answer    Emotionally Abused: Patient unable to answer    Physically Abused: Patient unable to answer    Sexually Abused: Patient unable to answer    Family History  Problem Relation Age of Onset   Heart disease Mother    Diabetes Son     Past Surgical History:  Procedure Laterality Date   ABDOMINAL HYSTERECTOMY  2000   ovaries remain   CYSTO WITH HYDRODISTENSION N/A 03/24/2017   Procedure: CYSTOSCOPY/HYDRODISTENSION INSTILLATION OF MARCAINE AND PYRIDIUM;  Surgeon: Bjorn Pippin, MD;  Location: [facility];  Service: Urology;  Laterality: N/A;   HYSTEROSCOPY WITH D & C  1999   POLYPECTOMY   TUBAL LIGATION Bilateral 1996    ROS: Review of Systems Negative except as stated above  PHYSICAL EXAM: BP (!) 140/86   Pulse 80   Temp 98.3 F (36.8 C) (Oral)   Ht 5\' 6"  (1.676 m)   Wt 200 lb 6.4 oz (90.9 kg)   SpO2 95%   BMI 32.35 kg/m   Physical Exam HENT:     Head: Normocephalic and atraumatic.     Nose: Nose normal.     Mouth/Throat:     Mouth: Mucous  membranes are moist.     Pharynx: Oropharynx is clear.  Eyes:     Extraocular Movements: Extraocular movements intact.     Conjunctiva/sclera: Conjunctivae normal.     Pupils: Pupils are equal, round, and reactive to light.  Cardiovascular:     Rate and Rhythm: Normal rate and regular rhythm.     Pulses: Normal pulses.     Heart sounds: Normal heart sounds.  Pulmonary:     Effort: Pulmonary effort is normal.     Breath sounds: Normal breath sounds.  Musculoskeletal:        General: Normal range of motion.     Cervical back: Normal range of motion and neck supple.  Neurological:     General: No focal deficit  present.     Mental Status: She is alert and oriented to person, place, and time.  Psychiatric:        Mood and Affect: Mood normal.        Behavior: Behavior normal.    ASSESSMENT AND PLAN: 1. Anxiety and depression - Patient denies thoughts of self-harm, suicidal ideations, homicidal ideations. - Continue Sertraline and Hydroxyzine as prescribed. Counseled on medication adherence/adverse effects.  - Follow-up with primary provider in 3 months or sooner if needed.  - hydrOXYzine (VISTARIL) 50 MG capsule; Take 1 capsule (50 mg total) by mouth every 8 (eight) hours as needed.  Dispense: 30 capsule; Refill: 2 - sertraline (ZOLOFT) 50 MG tablet; Take 1 tablet (50 mg total) by mouth daily.  Dispense: 30 tablet; Refill: 2  2. Encounter for weight management 3. BMI 32.0-32.9,adult - Increase Phentermine from 15 mg daily to 30 mg daily. Counseled on medication adherence/adverse effects.  - Follow-up with primary provider in 4 weeks or sooner if needed for weight check. - phentermine 30 MG capsule; Take 1 capsule (30 mg total) by mouth every morning.  Dispense: 30 capsule; Refill: 0  4. Frequent urination - Routine screening.  - Referral to Urology for evaluation/management. During the interim follow-up with primary provider as scheduled until established with referral. - POCT  URINALYSIS DIP (CLINITEK); Future - Urine Culture - Ambulatory referral to Urology - Cervicovaginal ancillary only - Basic Metabolic Panel      Patient was given the opportunity to ask questions.  Patient verbalized understanding of the plan and was able to repeat key elements of the plan. Patient was given clear instructions to go to Emergency Department or return to medical center if symptoms don't improve, worsen, or new problems develop.The patient verbalized understanding.   Orders Placed This Encounter  Procedures   Urine Culture   Basic Metabolic Panel   Ambulatory referral to Urology   POCT URINALYSIS DIP (CLINITEK)     Requested Prescriptions   Signed Prescriptions Disp Refills   hydrOXYzine (VISTARIL) 50 MG capsule 30 capsule 2    Sig: Take 1 capsule (50 mg total) by mouth every 8 (eight) hours as needed.   sertraline (ZOLOFT) 50 MG tablet 30 tablet 2    Sig: Take 1 tablet (50 mg total) by mouth daily.   phentermine 30 MG capsule 30 capsule 0    Sig: Take 1 capsule (30 mg total) by mouth every morning.    Return in about 4 weeks (around 05/02/2023) for Follow-Up or next available weight check.  Rema Fendt, NP

## 2023-04-04 NOTE — Progress Notes (Signed)
Patient asking for referral to Urologist due to always having bladder infections. Asking if she can have another UTI urine test done.   Patient states no other concerns to speak about.

## 2023-04-04 NOTE — Telephone Encounter (Signed)
Requested medication (s) are due for refill today - yes  Requested medication (s) are on the active medication list -yes  Future visit scheduled -today  Last refill: 03/02/23 #30  Notes to clinic: non delegated Rx  Requested Prescriptions  Pending Prescriptions Disp Refills   phentermine 15 MG capsule [Pharmacy Med Name: PHENTERMINE HCL 15MG  CAPSULES] 30 capsule     Sig: TAKE 1 CAPSULE(15 MG) BY MOUTH EVERY MORNING     Not Delegated - Neurology: Anticonvulsants - Controlled - phentermine hydrochloride Failed - 04/02/2023  1:29 PM      Failed - This refill cannot be delegated      Failed - eGFR in normal range and within 360 days    GFR calc Af Amer  Date Value Ref Range Status  03/04/2018 >60 >60 mL/min Final    Comment:    (NOTE) The eGFR has been calculated using the CKD EPI equation. This calculation has not been validated in all clinical situations. eGFR's persistently <60 mL/min signify possible Chronic Kidney Disease.    GFR calc non Af Amer  Date Value Ref Range Status  03/04/2018 >60 >60 mL/min Final         Failed - Cr in normal range and within 360 days    Creat  Date Value Ref Range Status  03/20/2016 0.75 0.50 - 1.10 mg/dL Final   Creatinine, Ser  Date Value Ref Range Status  03/04/2018 0.99 0.44 - 1.00 mg/dL Final         Passed - Last BP in normal range    BP Readings from Last 1 Encounters:  03/25/23 138/87         Passed - Valid encounter within last 6 months    Recent Outpatient Visits           1 week ago Acute cystitis without hematuria   [place] [facility], MD   1 month ago Anxiety and depression   [place] [facility] [place], [place]   2 months ago Encounter to establish care   [facility] [place], [place]   7 years ago Annual physical exam   [facility] Lev[place] July, MD   10 years ago Scalp pain   [facility] at [facility], Gwenlyn Found, MD       Future Appointments             Today Rema Fendt, NP [place] [facility]   In 1 week Rema Fendt, NP [facility] at [facility] - Weight completed in the last 3 months    Wt Readings from Last 1 Encounters:  03/25/23 203 lb (92.1 kg)            Requested Prescriptions  Pending Prescriptions Disp Refills   phentermine 15 MG capsule [Pharmacy Med Name: PHENTERMINE HCL 15MG  CAPSULES] 30 capsule     Sig: TAKE 1 CAPSULE(15 MG) BY MOUTH EVERY MORNING     Not Delegated - Neurology: Anticonvulsants - Controlled - phentermine hydrochloride Failed - 04/02/2023  1:29 PM      Failed - This refill cannot be delegated      Failed - eGFR in normal range and within 360 days    GFR calc Af Amer  Date Value Ref Range Status  03/04/2018 >60 >  60 mL/min Final    Comment:    (NOTE) The eGFR has been calculated using the CKD EPI equation. This calculation has not been validated in all clinical situations. eGFR's persistently <60 mL/min signify possible Chronic Kidney Disease.    GFR calc non Af Amer  Date Value Ref Range Status  03/04/2018 >60 >60 mL/min Final         Failed - Cr in normal range and within 360 days    Creat  Date Value Ref Range Status  03/20/2016 0.75 0.50 - 1.10 mg/dL Final   Creatinine, Ser  Date Value Ref Range Status  03/04/2018 0.99 0.44 - 1.00 mg/dL Final         Passed - Last BP in normal range    BP Readings from Last 1 Encounters:  03/25/23 138/87         Passed - Valid encounter within last 6 months    Recent Outpatient Visits           1 week ago Acute cystitis without hematuria   [place] [facility], MD   1 month ago Anxiety and depression   [place] [facility], [place], [place]   2 months ago Encounter to establish care   [facility] at [facility] [place],  NP   7 years ago Annual physical exam   [facility] at [facility] [place] July, MD   10 years ago Scalp pain   Primary Care at [facility], Gwenlyn Found, MD       Future Appointments             Today Rema Fendt, NP [place] [facility]   In 1 week Rema Fendt, NP [facility] at [facility] - Weight completed in the last 3 months    Wt Readings from Last 1 Encounters:  03/25/23 203 lb (92.1 kg)

## 2023-04-05 ENCOUNTER — Other Ambulatory Visit: Payer: Self-pay | Admitting: Family

## 2023-04-05 DIAGNOSIS — Z419 Encounter for procedure for purposes other than remedying health state, unspecified: Secondary | ICD-10-CM | POA: Diagnosis not present

## 2023-04-05 DIAGNOSIS — N3000 Acute cystitis without hematuria: Secondary | ICD-10-CM

## 2023-04-05 LAB — BASIC METABOLIC PANEL
BUN/Creatinine Ratio: 15 (ref 9–23)
BUN: 14 mg/dL (ref 6–24)
CO2: 24 mmol/L (ref 20–29)
Calcium: 9.3 mg/dL (ref 8.7–10.2)
Chloride: 102 mmol/L (ref 96–106)
Creatinine, Ser: 0.93 mg/dL (ref 0.57–1.00)
Glucose: 91 mg/dL (ref 70–99)
Potassium: 5.1 mmol/L (ref 3.5–5.2)
Sodium: 139 mmol/L (ref 134–144)
eGFR: 74 mL/min/{1.73_m2} (ref >59)

## 2023-04-05 MED ORDER — AMOXICILLIN-POT CLAVULANATE 875-125 MG PO TABS: 1.0000 | ORAL_TABLET | Freq: Two times a day (BID) | ORAL | 0 refills | Status: DC

## 2023-04-05 NOTE — Telephone Encounter (Signed)
Phentermine prescribed 04/04/2023.

## 2023-04-06 ENCOUNTER — Other Ambulatory Visit: Payer: Self-pay | Admitting: Family

## 2023-04-06 DIAGNOSIS — R350 Frequency of micturition: Secondary | ICD-10-CM

## 2023-04-06 LAB — CERVICOVAGINAL ANCILLARY ONLY
Comment: NEGATIVE
Comment: NEGATIVE
Comment: NEGATIVE
Comment: NEGATIVE
Comment: NEGATIVE
Comment: NORMAL

## 2023-04-06 LAB — URINE CULTURE: Organism ID, Bacteria: NO GROWTH

## 2023-04-07 ENCOUNTER — Ambulatory Visit (INDEPENDENT_AMBULATORY_CARE_PROVIDER_SITE_OTHER): Payer: Medicaid Other | Admitting: Urology

## 2023-04-07 ENCOUNTER — Encounter: Payer: Self-pay | Admitting: Urology

## 2023-04-07 VITALS — BP 152/87 | HR 84 | Ht 66.0 in | Wt 200.0 lb

## 2023-04-07 DIAGNOSIS — N3281 Overactive bladder: Secondary | ICD-10-CM | POA: Diagnosis not present

## 2023-04-07 DIAGNOSIS — N3010 Interstitial cystitis (chronic) without hematuria: Secondary | ICD-10-CM

## 2023-04-07 DIAGNOSIS — R350 Frequency of micturition: Secondary | ICD-10-CM | POA: Diagnosis not present

## 2023-04-07 LAB — URINALYSIS, ROUTINE W REFLEX MICROSCOPIC
Bilirubin, UA: NEGATIVE
Glucose, UA: NEGATIVE
Nitrite, UA: NEGATIVE
Protein,UA: NEGATIVE
Specific Gravity, UA: 1.02 (ref 1.005–1.030)
Urobilinogen, Ur: 0.2 mg/dL (ref 0.2–1.0)
pH, UA: 6 (ref 5.0–7.5)

## 2023-04-07 LAB — MICROSCOPIC EXAMINATION

## 2023-04-07 LAB — BLADDER SCAN AMB NON-IMAGING

## 2023-04-07 MED ORDER — GEMTESA 75 MG PO TABS: 75.0000 mg | ORAL_TABLET | Freq: Every day | ORAL | 0 refills | Status: DC

## 2023-04-07 NOTE — Progress Notes (Signed)
Assessment: 1. Urinary frequency   2. OAB (overactive bladder)   3. Interstitial cystitis     Plan: I personally reviewed the patient's chart including provider notes, and lab results. I reviewed the patient's records from [facility]. Given the recent urine culture results, I do not think that her symptoms are due to a UTI.  I recommended that she discontinue the Augmentin at this time. Recommend avoiding dietary irritants, bladder diet sheet given. Trial of Gemtesa 75 mg daily.  Samples provided. Return to office in 1 month.  Chief Complaint:  Chief Complaint  Patient presents with   Urinary Frequency    History of Present Illness:  Maria Blackburn is a 52 y.o. female who is seen in consultation from Rema Fendt, NP for evaluation of urinary frequency.  She has a long history of urinary symptoms including frequency, urgency, nocturia, and bladder pain.  She was previously followed by Dr. Isabel Caprice and subsequently Dr. Annabell Howells for a diagnosis of interstitial cystitis.  She underwent cystoscopy with hydrodistention in September 2018.  Review of the operative note indicates that she had a bladder capacity of 750 mL and had glomerulations noted after hydrodistention.  She noted some improvement in her symptoms following this procedure.  She was subsequently referred for pelvic floor physical therapy but did not pursue this treatment.  She was also placed on hydroxyzine.  She has previously received intravesical therapy for IC. She has not been seen by urology for the past 5 years.  She has continued to have significant urinary frequency, voiding multiple times per hour, urgency, bladder pain, nocturia every 1-2 hours, incontinence, decreased stream, and sensation of incomplete emptying.  She has noted some further worsening of her symptoms within the past 2 months.  She has been treated for UTIs without improvement in her bladder symptoms.  U/A from 03/25/23:  negative blood, negative  LE, + nitrite She was treated with Macrobid. She continued with symptoms and was placed on Bactrim. She was seen on 04/04/23 for continued UTI symptoms and placed on Augmentin. U/A was negative for blood, negative for LE, and nitrite + Urine culture from 9/30//2024 showed no growth.  She is currently on Augmentin without any change in her symptoms.  No gross hematuria or flank pain.   Past Medical History:  Past Medical History:  Diagnosis Date   Anxiety    Bladder spasms    Frequency of urination    GERD (gastroesophageal reflux disease)    Interstitial cystitis    Pelvic pain    SUI (stress urinary incontinence, female)    Urgency of urination     Past Surgical History:  Past Surgical History:  Procedure Laterality Date   ABDOMINAL HYSTERECTOMY  2000   ovaries remain   CYSTO WITH HYDRODISTENSION N/A 03/24/2017   Procedure: CYSTOSCOPY/HYDRODISTENSION INSTILLATION OF MARCAINE AND PYRIDIUM;  Surgeon: Bjorn Pippin, MD;  Location: [facility];  Service: Urology;  Laterality: N/A;   HYSTEROSCOPY WITH D & C  1999   POLYPECTOMY   TUBAL LIGATION Bilateral 1996    Allergies:  No Known Allergies  Family History:  Family History  Problem Relation Age of Onset   Heart disease Mother    Diabetes Son     Social History:  Social History   Tobacco Use   Smoking status: Never   Smokeless tobacco: Never  Substance Use Topics   Alcohol use: No   Drug use: No    Review of symptoms:  Constitutional:  Negative for unexplained weight loss, night sweats, fever, chills ENT:  Negative for nose bleeds, sinus pain, painful swallowing CV:  Negative for chest pain, shortness of breath, exercise intolerance, palpitations, loss of consciousness Resp:  Negative for cough, wheezing, shortness of breath GI:  Negative for nausea, vomiting, diarrhea, bloody stools GU:  Positives noted in HPI; otherwise negative for gross hematuria Neuro:  Negative for seizures, poor balance,  limb weakness, slurred speech Psych:  Negative for lack of energy, depression, anxiety Endocrine:  Negative for polydipsia, polyuria, symptoms of hypoglycemia (dizziness, hunger, sweating) Hematologic:  Negative for anemia, purpura, petechia, prolonged or excessive bleeding, use of anticoagulants  Allergic:  Negative for difficulty breathing or choking as a result of exposure to anything; no shellfish allergy; no allergic response (rash/itch) to materials, foods  Physical exam: BP (!) 152/87   Pulse 84   Ht 5\' 6"  (1.676 m)   Wt 200 lb (90.7 kg)   BMI 32.28 kg/m  GENERAL APPEARANCE:  Well appearing, well developed, well nourished, NAD HEENT: Atraumatic, Normocephalic, oropharynx clear. NECK: Supple without lymphadenopathy or thyromegaly. LUNGS: Clear to auscultation bilaterally. HEART: Regular Rate and Rhythm without murmurs, gallops, or rubs. ABDOMEN: Soft, non-tender, No Masses. EXTREMITIES: Moves all extremities well.  Without clubbing, cyanosis, or edema. NEUROLOGIC:  Alert and oriented x 3, normal gait, CN II-XII grossly intact.  MENTAL STATUS:  Appropriate. BACK:  Non-tender to palpation.  No CVAT SKIN:  Warm, dry and intact.    Results: U/A: 0-5 WBCs, 0-2 RBCs  PVR: 0 ml

## 2023-04-11 ENCOUNTER — Encounter: Payer: Medicaid Other | Admitting: Family

## 2023-05-06 DIAGNOSIS — Z419 Encounter for procedure for purposes other than remedying health state, unspecified: Secondary | ICD-10-CM | POA: Diagnosis not present

## 2023-05-12 ENCOUNTER — Ambulatory Visit: Payer: Medicaid Other | Admitting: Urology

## 2023-05-12 NOTE — Progress Notes (Deleted)
Assessment: 1. Urinary frequency   2. OAB (overactive bladder)   3. Interstitial cystitis     Plan: Given the recent urine culture results, I do not think that her symptoms are due to a UTI.  I recommended that she discontinue the Augmentin at this time. Recommend avoiding dietary irritants, bladder diet sheet given. Trial of Gemtesa 75 mg daily.  Samples provided. Return to office in 1 month.  Chief Complaint:  No chief complaint on file.   History of Present Illness:  Maria Blackburn is a 52 y.o. female who is seen for further evaluation of urinary frequency, OAB, and interstitial cystitis.   She has a long history of urinary symptoms including frequency, urgency, nocturia, and bladder pain.  She was previously followed by Maria Blackburn and subsequently Maria Blackburn for a diagnosis of interstitial cystitis.  She underwent cystoscopy with hydrodistention in September 2018.  Review of the operative note indicates that she had a bladder capacity of 750 mL and had glomerulations noted after hydrodistention.  She noted some improvement in her symptoms following this procedure.  She was subsequently referred for pelvic floor physical therapy but did not pursue this treatment.  She was also placed on hydroxyzine.  She has previously received intravesical therapy for IC. She had not been seen by urology for the past 5 years.  She continued to have significant urinary frequency, voiding multiple times per hour, urgency, bladder pain, nocturia every 1-2 hours, incontinence, decreased stream, and sensation of incomplete emptying.  She  noted some further worsening of her symptoms within the past 2 months.  She had been treated for UTIs without improvement in her bladder symptoms.  U/A from 03/25/23:  negative blood, negative LE, + nitrite She was treated with Macrobid. She continued with symptoms and was placed on Bactrim. She was seen on 04/04/23 for continued UTI symptoms and placed on Augmentin. U/A  was negative for blood, negative for LE, and nitrite + Urine culture from 9/30//2024 showed no growth.  She was on Augmentin without any change in her symptoms.  No gross hematuria or flank pain. She was given a trial of Gemtesa 75 mg daily at her visit in 10/24.  Portions of the above documentation were copied from a prior visit for review purposes only.   Past Medical History:  Past Medical History:  Diagnosis Date   Anxiety    Bladder spasms    Frequency of urination    GERD (gastroesophageal reflux disease)    Interstitial cystitis    Pelvic pain    SUI (stress urinary incontinence, female)    Urgency of urination     Past Surgical History:  Past Surgical History:  Procedure Laterality Date   ABDOMINAL HYSTERECTOMY  2000   ovaries remain   CYSTO WITH HYDRODISTENSION N/A 03/24/2017   Procedure: CYSTOSCOPY/HYDRODISTENSION INSTILLATION OF MARCAINE AND PYRIDIUM;  Surgeon: Bjorn Pippin, MD;  Location: [facility];  Service: Urology;  Laterality: N/A;   HYSTEROSCOPY WITH D & C  1999   POLYPECTOMY   TUBAL LIGATION Bilateral 1996    Allergies:  No Known Allergies  Family History:  Family History  Problem Relation Age of Onset   Heart disease Mother    Diabetes Son     Social History:  Social History   Tobacco Use   Smoking status: Never   Smokeless tobacco: Never  Substance Use Topics   Alcohol use: No   Drug use: No    ROS: Constitutional:  Negative  for fever, chills, weight loss CV: Negative for chest pain, previous MI, hypertension Respiratory:  Negative for shortness of breath, wheezing, sleep apnea, frequent cough GI:  Negative for nausea, vomiting, bloody stool, GERD  Physical exam: There were no vitals taken for this visit. GENERAL APPEARANCE:  Well appearing, well developed, well nourished, NAD HEENT:  Atraumatic, normocephalic, oropharynx clear NECK:  Supple without lymphadenopathy or thyromegaly ABDOMEN:  Soft, non-tender, no  masses EXTREMITIES:  Moves all extremities well, without clubbing, cyanosis, or edema NEUROLOGIC:  Alert and oriented x 3, normal gait, CN II-XII grossly intact MENTAL STATUS:  appropriate BACK:  Non-tender to palpation, No CVAT SKIN:  Warm, dry, and intact  Results: U/A:

## 2023-05-18 ENCOUNTER — Ambulatory Visit: Payer: Medicaid Other | Admitting: Urology

## 2023-05-31 ENCOUNTER — Ambulatory Visit (INDEPENDENT_AMBULATORY_CARE_PROVIDER_SITE_OTHER): Payer: Medicaid Other | Admitting: Urology

## 2023-05-31 ENCOUNTER — Encounter: Payer: Self-pay | Admitting: Urology

## 2023-05-31 VITALS — BP 134/87 | HR 85 | Ht 66.0 in | Wt 195.0 lb

## 2023-05-31 DIAGNOSIS — N3010 Interstitial cystitis (chronic) without hematuria: Secondary | ICD-10-CM | POA: Diagnosis not present

## 2023-05-31 DIAGNOSIS — R350 Frequency of micturition: Secondary | ICD-10-CM

## 2023-05-31 DIAGNOSIS — N3281 Overactive bladder: Secondary | ICD-10-CM

## 2023-05-31 LAB — URINALYSIS, ROUTINE W REFLEX MICROSCOPIC
Bilirubin, UA: NEGATIVE
Glucose, UA: NEGATIVE
Ketones, UA: NEGATIVE
Leukocytes,UA: NEGATIVE
Nitrite, UA: NEGATIVE
Protein,UA: NEGATIVE
RBC, UA: NEGATIVE
Specific Gravity, UA: 1.02 (ref 1.005–1.030)
Urobilinogen, Ur: 0.2 mg/dL (ref 0.2–1.0)
pH, UA: 7 (ref 5.0–7.5)

## 2023-05-31 MED ORDER — SOLIFENACIN SUCCINATE 10 MG PO TABS: 10.0000 mg | ORAL_TABLET | Freq: Every day | ORAL | 11 refills | Status: DC

## 2023-05-31 NOTE — Progress Notes (Signed)
Assessment: 1. Urinary frequency   2. OAB (overactive bladder)   3. Interstitial cystitis     Plan: Continue bladder diet Trial of solifenacin 10 mg daily.  Rx sent.  Use and side effects discussed. Return to office in 2 months  Chief Complaint:  Chief Complaint  Patient presents with   Urinary Frequency    History of Present Illness:  Maria Blackburn is a 52 y.o. female who is seen for further evaluation of urinary frequency, OAB, and interstitial cystitis.   She has a long history of urinary symptoms including frequency, urgency, nocturia, and bladder pain.  She was previously followed by Dr. Isabel Caprice and subsequently Dr. Annabell Howells for a diagnosis of interstitial cystitis.  She underwent cystoscopy with hydrodistention in September 2018.  Review of the operative note indicates that she had a bladder capacity of 750 mL and had glomerulations noted after hydrodistention.  She noted some improvement in her symptoms following this procedure.  She was subsequently referred for pelvic floor physical therapy but did not pursue this treatment.  She was also placed on hydroxyzine.  She has previously received intravesical therapy for IC. She had not been seen by urology for the past 5 years.  She continued to have significant urinary frequency, voiding multiple times per hour, urgency, bladder pain, nocturia every 1-2 hours, incontinence, decreased stream, and sensation of incomplete emptying.  She  noted some further worsening of her symptoms within the past 2 months.  She had been treated for UTIs without improvement in her bladder symptoms.  U/A from 03/25/23:  negative blood, negative LE, + nitrite She was treated with Macrobid. She continued with symptoms and was placed on Bactrim. She was seen on 04/04/23 for continued UTI symptoms and placed on Augmentin. U/A was negative for blood, negative for LE, and nitrite + Urine culture from 9/30//2024 showed no growth.  She was on Augmentin without  any change in her symptoms.  No gross hematuria or flank pain. She was given a trial of Gemtesa 75 mg daily at her visit in 10/24.  She returns today for follow-up.  She reports improvement in her urinary symptoms with the Gemtesa.  No side effects.  She ran out of the samples approximately 2 weeks ago and has noted a return of her frequency and urgency.  No current dysuria or gross hematuria.  Portions of the above documentation were copied from a prior visit for review purposes only.   Past Medical History:  Past Medical History:  Diagnosis Date   Anxiety    Bladder spasms    Frequency of urination    GERD (gastroesophageal reflux disease)    Interstitial cystitis    Pelvic pain    SUI (stress urinary incontinence, female)    Urgency of urination     Past Surgical History:  Past Surgical History:  Procedure Laterality Date   ABDOMINAL HYSTERECTOMY  2000   ovaries remain   CYSTO WITH HYDRODISTENSION N/A 03/24/2017   Procedure: CYSTOSCOPY/HYDRODISTENSION INSTILLATION OF MARCAINE AND PYRIDIUM;  Surgeon: Bjorn Pippin, MD;  Location: [facility];  Service: Urology;  Laterality: N/A;   HYSTEROSCOPY WITH D & C  1999   POLYPECTOMY   TUBAL LIGATION Bilateral 1996    Allergies:  No Known Allergies  Family History:  Family History  Problem Relation Age of Onset   Heart disease Mother    Diabetes Son     Social History:  Social History   Tobacco Use   Smoking status:  Never   Smokeless tobacco: Never  Substance Use Topics   Alcohol use: No   Drug use: No    ROS: Constitutional:  Negative for fever, chills, weight loss CV: Negative for chest pain, previous MI, hypertension Respiratory:  Negative for shortness of breath, wheezing, sleep apnea, frequent cough GI:  Negative for nausea, vomiting, bloody stool, GERD  Physical exam: BP 134/87   Pulse 85   Ht 5\' 6"  (1.676 m)   Wt 195 lb (88.5 kg)   BMI 31.47 kg/m  GENERAL APPEARANCE:  Well appearing, well  developed, well nourished, NAD HEENT:  Atraumatic, normocephalic, oropharynx clear NECK:  Supple without lymphadenopathy or thyromegaly ABDOMEN:  Soft, non-tender, no masses EXTREMITIES:  Moves all extremities well, without clubbing, cyanosis, or edema NEUROLOGIC:  Alert and oriented x 3, normal gait, CN II-XII grossly intact MENTAL STATUS:  appropriate BACK:  Non-tender to palpation, No CVAT SKIN:  Warm, dry, and intact  Results: U/A: Negative

## 2023-06-05 DIAGNOSIS — Z419 Encounter for procedure for purposes other than remedying health state, unspecified: Secondary | ICD-10-CM | POA: Diagnosis not present

## 2023-06-20 ENCOUNTER — Encounter: Payer: Medicaid Other | Admitting: Family

## 2023-06-20 NOTE — Progress Notes (Signed)
Erroneous encounter-disregard

## 2023-07-07 ENCOUNTER — Ambulatory Visit (INDEPENDENT_AMBULATORY_CARE_PROVIDER_SITE_OTHER): Payer: Medicaid Other | Admitting: Family

## 2023-07-07 ENCOUNTER — Other Ambulatory Visit (HOSPITAL_COMMUNITY)
Admission: RE | Admit: 2023-07-07 | Discharge: 2023-07-07 | Disposition: A | Discharge status: Home / Self Care | Payer: Medicaid Other | Source: Ambulatory Visit | Attending: Family | Admitting: Family

## 2023-07-07 VITALS — BP 136/80 | HR 78 | Temp 98.6°F | Ht 66.0 in | Wt 206.6 lb

## 2023-07-07 DIAGNOSIS — Z1231 Encounter for screening mammogram for malignant neoplasm of breast: Secondary | ICD-10-CM | POA: Diagnosis not present

## 2023-07-07 DIAGNOSIS — Z1329 Encounter for screening for other suspected endocrine disorder: Secondary | ICD-10-CM | POA: Diagnosis not present

## 2023-07-07 DIAGNOSIS — F32A Depression, unspecified: Secondary | ICD-10-CM | POA: Diagnosis not present

## 2023-07-07 DIAGNOSIS — Z1211 Encounter for screening for malignant neoplasm of colon: Secondary | ICD-10-CM

## 2023-07-07 DIAGNOSIS — F419 Anxiety disorder, unspecified: Secondary | ICD-10-CM

## 2023-07-07 DIAGNOSIS — Z131 Encounter for screening for diabetes mellitus: Secondary | ICD-10-CM

## 2023-07-07 DIAGNOSIS — Z13220 Encounter for screening for lipoid disorders: Secondary | ICD-10-CM

## 2023-07-07 DIAGNOSIS — Z113 Encounter for screening for infections with a predominantly sexual mode of transmission: Secondary | ICD-10-CM

## 2023-07-07 DIAGNOSIS — Z0000 Encounter for general adult medical examination without abnormal findings: Secondary | ICD-10-CM

## 2023-07-07 DIAGNOSIS — Z124 Encounter for screening for malignant neoplasm of cervix: Secondary | ICD-10-CM | POA: Insufficient documentation

## 2023-07-07 DIAGNOSIS — Z13228 Encounter for screening for other metabolic disorders: Secondary | ICD-10-CM

## 2023-07-07 DIAGNOSIS — Z130 Encounter for screening for diseases of the blood and blood-forming organs and certain disorders involving the immune mechanism: Secondary | ICD-10-CM

## 2023-07-07 DIAGNOSIS — Z114 Encounter for screening for human immunodeficiency virus [HIV]: Secondary | ICD-10-CM

## 2023-07-07 LAB — CERVICOVAGINAL ANCILLARY ONLY
Bacterial Vaginitis (gardnerella): NEGATIVE
Candida Glabrata: NEGATIVE
Candida Vaginitis: NEGATIVE
Chlamydia: NEGATIVE
Comment: NEGATIVE
Comment: NEGATIVE
Comment: NEGATIVE
Comment: NEGATIVE
Comment: NEGATIVE
Comment: NORMAL
Neisseria Gonorrhea: NEGATIVE
Trichomonas: NEGATIVE

## 2023-07-07 MED ORDER — HYDROXYZINE PAMOATE 50 MG PO CAPS: 50.0000 mg | ORAL_CAPSULE | Freq: Three times a day (TID) | ORAL | 2 refills | Status: DC | PRN

## 2023-07-07 MED ORDER — SERTRALINE HCL 50 MG PO TABS: 50.0000 mg | ORAL_TABLET | Freq: Every day | ORAL | 2 refills | Status: DC

## 2023-07-07 NOTE — Progress Notes (Signed)
 Patient ID: Maria Blackburn, female    DOB: Jul 16, 1970  MRN: [id number]  CC: Annual Exam  Subjective: Maria Blackburn is a 53 y.o. female who presents for annual exam.   Her concerns today include:  - Doing well on Sertraline  and Hydroxyzine , no issues/concerns. She denies thoughts of self-harm, suicidal ideations, homicidal ideations. - Patient plans to schedule follow-up appointment soon to discuss additional issues/concerns.  Patient Active Problem List   Diagnosis Date Noted   De Quervain's tenosynovitis, left 09/10/2021   Lateral epicondylitis, right elbow 09/10/2021   Allergy to environmental factors 10/07/2017     Current Outpatient Medications on File Prior to Visit  Medication Sig Dispense Refill   acetaminophen  (TYLENOL ) 500 MG tablet Take 1 tablet (500 mg total) by mouth every 6 (six) hours as needed. 10 tablet 0   dimenhyDRINATE (DRAMAMINE) 50 MG tablet Take 50 mg by mouth every 8 (eight) hours as needed.     ibuprofen  (ADVIL ) 600 MG tablet Take 1 tablet (600 mg total) by mouth 3 (three) times daily. 9 tablet 0   phentermine  30 MG capsule Take 1 capsule (30 mg total) by mouth every morning. 30 capsule 0   solifenacin  (VESICARE ) 10 MG tablet Take 1 tablet (10 mg total) by mouth daily. 30 tablet 11   No current facility-administered medications on file prior to visit.    No Known Allergies  Social History   Socioeconomic History   Marital status: Married    Spouse name: Not on file   Number of children: Not on file   Years of education: Not on file   Highest education level: Not on file  Occupational History   Not on file  Tobacco Use   Smoking status: Never   Smokeless tobacco: Never  Substance and Sexual Activity   Alcohol use: No   Drug use: No   Sexual activity: Not on file  Other Topics Concern   Not on file  Social History Narrative   Not on file   Social Drivers of Health   Financial Resource Strain: Low Risk  (03/25/2023)   Overall Financial  Resource Strain (CARDIA)    Difficulty of Paying Living Expenses: Not very hard  Food Insecurity: Patient Unable To Answer (03/25/2023)   Hunger Vital Sign    Worried About Running Out of Food in the Last Year: Patient unable to answer    Ran Out of Food in the Last Year: Patient unable to answer  Transportation Needs: Patient Unable To Answer (03/25/2023)   PRAPARE - Transportation    Lack of Transportation (Medical): Patient unable to answer    Lack of Transportation (Non-Medical): Patient unable to answer  Physical Activity: Patient Unable To Answer (03/25/2023)   Exercise Vital Sign    Days of Exercise per Week: Patient unable to answer    Minutes of Exercise per Session: Patient unable to answer  Stress: Patient Unable To Answer (03/25/2023)   [employer] of Occupational Health - Occupational Stress Questionnaire    Feeling of Stress : Patient unable to answer  Social Connections: Patient Unable To Answer (03/25/2023)   Social Connection and Isolation Panel [NHANES]    Frequency of Communication with Friends and Family: Patient unable to answer    Frequency of Social Gatherings with Friends and Family: Patient unable to answer    Attends Religious Services: Patient unable to answer    Active Member of Clubs or Organizations: Patient unable to answer    Attends Banker  Meetings: Patient unable to answer    Marital Status: Patient unable to answer  Intimate Partner Violence: Patient Unable To Answer (03/25/2023)   Humiliation, Afraid, Rape, and Kick questionnaire    Fear of Current or Ex-Partner: Patient unable to answer    Emotionally Abused: Patient unable to answer    Physically Abused: Patient unable to answer    Sexually Abused: Patient unable to answer    Family History  Problem Relation Age of Onset   Heart disease Mother    Diabetes Son     Past Surgical History:  Procedure Laterality Date   ABDOMINAL HYSTERECTOMY  2000   ovaries remain   CYSTO WITH  HYDRODISTENSION N/A 03/24/2017   Procedure: CYSTOSCOPY/HYDRODISTENSION INSTILLATION OF MARCAINE  AND PYRIDIUM ;  Surgeon: Watt Rush, MD;  Location: [facility];  Service: Urology;  Laterality: N/A;   HYSTEROSCOPY WITH D & C  1999   POLYPECTOMY   TUBAL LIGATION Bilateral 1996    ROS: Review of Systems Negative except as stated above  PHYSICAL EXAM: BP 136/80   Pulse 78   Temp 98.6 F (37 C) (Oral)   Ht 5' 6 (1.676 m)   Wt 206 lb 9.6 oz (93.7 kg)   SpO2 96%   BMI 33.35 kg/m   Physical Exam HENT:     Head: Normocephalic and atraumatic.     Right Ear: Tympanic membrane, ear canal and external ear normal.     Left Ear: Tympanic membrane, ear canal and external ear normal.     Nose: Nose normal.     Mouth/Throat:     Mouth: Mucous membranes are moist.     Pharynx: Oropharynx is clear.  Eyes:     Extraocular Movements: Extraocular movements intact.     Conjunctiva/sclera: Conjunctivae normal.     Pupils: Pupils are equal, round, and reactive to light.  Neck:     Thyroid : No thyroid  mass, thyromegaly or thyroid  tenderness.  Cardiovascular:     Rate and Rhythm: Normal rate and regular rhythm.     Pulses: Normal pulses.     Heart sounds: Normal heart sounds.  Pulmonary:     Effort: Pulmonary effort is normal.     Breath sounds: Normal breath sounds.  Chest:     Comments: Patient declined.  Abdominal:     General: Bowel sounds are normal.     Palpations: Abdomen is soft.  Genitourinary:    General: Normal vulva.     Vagina: Normal.     Cervix: Normal.     Uterus: Normal.      Adnexa: Right adnexa normal and left adnexa normal.     Comments: Maria Blackburn, CMA present.  Musculoskeletal:        General: Normal range of motion.     Right shoulder: Normal.     Left shoulder: Normal.     Right upper arm: Normal.     Left upper arm: Normal.     Right elbow: Normal.     Left elbow: Normal.     Right forearm: Normal.     Left forearm: Normal.     Right  wrist: Normal.     Left wrist: Normal.     Right hand: Normal.     Left hand: Normal.     Cervical back: Normal, normal range of motion and neck supple.     Thoracic back: Normal.     Lumbar back: Normal.     Right hip: Normal.     Left  hip: Normal.     Right upper leg: Normal.     Left upper leg: Normal.     Right knee: Normal.     Left knee: Normal.     Right lower leg: Normal.     Left lower leg: Normal.     Right ankle: Normal.     Left ankle: Normal.     Right foot: Normal.     Left foot: Normal.  Skin:    General: Skin is warm and dry.     Capillary Refill: Capillary refill takes less than 2 seconds.  Neurological:     General: No focal deficit present.     Mental Status: She is alert and oriented to person, place, and time.  Psychiatric:        Mood and Affect: Mood normal.        Behavior: Behavior normal.     ASSESSMENT AND PLAN: 1. Annual physical exam (Primary) - Counseled on 150 minutes of exercise per week as tolerated, healthy eating (including decreased daily intake of saturated fats, cholesterol, added sugars, sodium), STI prevention, and routine healthcare maintenance.  2. Screening for metabolic disorder - Routine screening.  - CMP14+EGFR  3. Screening for deficiency anemia - Routine screening.  - CBC  4. Diabetes mellitus screening - Routine screening.  - Hemoglobin A1c  5. Screening cholesterol level - Routine screening.  - Lipid panel  6. Thyroid  disorder screen - Routine screening.  - TSH  7. Encounter for screening mammogram for malignant neoplasm of breast - Routine screening.  - MM Digital Screening; Future  8. Pap smear for cervical cancer screening - Routine screening.  - Cytology - PAP([facility])  9. Routine screening for STI (sexually transmitted infection) - Routine screening.  - Cervicovaginal ancillary only  10. Encounter for screening for HIV - Routine screening.  - HIV antibody (with reflex)  11. Colon cancer  screening - Referral to Gastroenterology for colon cancer screening by colonoscopy. - Ambulatory referral to Gastroenterology  12. Anxiety and depression - Patient denies thoughts of self-harm, suicidal ideations, homicidal ideations. - Continue Hydroxyzine  and Sertraline  as prescribed. Counseled on medication adherence/adverse effects.  - Follow-up with primary provider in 3 months or sooner if needed.  - hydrOXYzine  (VISTARIL ) 50 MG capsule; Take 1 capsule (50 mg total) by mouth every 8 (eight) hours as needed.  Dispense: 30 capsule; Refill: 2 - sertraline  (ZOLOFT ) 50 MG tablet; Take 1 tablet (50 mg total) by mouth daily.  Dispense: 30 tablet; Refill: 2    Patient was given the opportunity to ask questions.  Patient verbalized understanding of the plan and was able to repeat key elements of the plan. Patient was given clear instructions to go to Emergency Department or return to medical center if symptoms don't improve, worsen, or new problems develop.The patient verbalized understanding.   Orders Placed This Encounter  Procedures   MM Digital Screening   CBC   Lipid panel   CMP14+EGFR   Hemoglobin A1c   TSH   HIV antibody (with reflex)   Ambulatory referral to Gastroenterology     Requested Prescriptions   Signed Prescriptions Disp Refills   hydrOXYzine  (VISTARIL ) 50 MG capsule 30 capsule 2    Sig: Take 1 capsule (50 mg total) by mouth every 8 (eight) hours as needed.   sertraline  (ZOLOFT ) 50 MG tablet 30 tablet 2    Sig: Take 1 tablet (50 mg total) by mouth daily.    Return in about 1 year (around  07/06/2024) for Physical per patient preference and 3 months chronic conditions.  Greig JINNY Drones, NP

## 2023-07-07 NOTE — Progress Notes (Signed)
 Patient wants Pap to be done.  States she wants to talk about arm pain that is still bothering her.   States multiple things to discuss.  Wants shingles shot and Flu vaccine.

## 2023-07-08 ENCOUNTER — Other Ambulatory Visit: Payer: Self-pay | Admitting: Family

## 2023-07-08 DIAGNOSIS — Z13220 Encounter for screening for lipoid disorders: Secondary | ICD-10-CM

## 2023-07-08 LAB — CBC
Hematocrit: 43.1 % (ref 34.0–46.6)
Hemoglobin: 14.5 g/dL (ref 11.1–15.9)
MCH: 30.3 pg (ref 26.6–33.0)
MCHC: 33.6 g/dL (ref 31.5–35.7)
MCV: 90 fL (ref 79–97)
Platelets: 314 10*3/uL (ref 150–450)
RBC: 4.78 x10E6/uL (ref 3.77–5.28)
RDW: 12.6 % (ref 11.7–15.4)
WBC: 5.6 10*3/uL (ref 3.4–10.8)

## 2023-07-08 LAB — CMP14+EGFR
ALT: 17 [IU]/L (ref 0–32)
AST: 15 [IU]/L (ref 0–40)
Albumin: 4.3 g/dL (ref 3.8–4.9)
Alkaline Phosphatase: 55 [IU]/L (ref 44–121)
BUN/Creatinine Ratio: 17 (ref 9–23)
BUN: 14 mg/dL (ref 6–24)
Bilirubin Total: 0.9 mg/dL (ref 0.0–1.2)
CO2: 23 mmol/L (ref 20–29)
Calcium: 9.4 mg/dL (ref 8.7–10.2)
Chloride: 106 mmol/L (ref 96–106)
Creatinine, Ser: 0.83 mg/dL (ref 0.57–1.00)
Globulin, Total: 2.2 g/dL (ref 1.5–4.5)
Glucose: 114 mg/dL — ABNORMAL HIGH (ref 70–99)
Potassium: 4.5 mmol/L (ref 3.5–5.2)
Sodium: 143 mmol/L (ref 134–144)
Total Protein: 6.5 g/dL (ref 6.0–8.5)
eGFR: 84 mL/min/{1.73_m2} (ref >59)

## 2023-07-08 LAB — TSH: TSH: 0.915 u[IU]/mL (ref 0.450–4.500)

## 2023-07-08 LAB — LIPID PANEL
Chol/HDL Ratio: 4.1 {ratio} (ref 0.0–4.4)
Cholesterol, Total: 222 mg/dL — ABNORMAL HIGH (ref 100–199)
HDL: 54 mg/dL (ref >39)
LDL Chol Calc (NIH): 147 mg/dL — ABNORMAL HIGH (ref 0–99)
Triglycerides: 119 mg/dL (ref 0–149)
VLDL Cholesterol Cal: 21 mg/dL (ref 5–40)

## 2023-07-08 LAB — HEMOGLOBIN A1C
Est. average glucose Bld gHb Est-mCnc: 123 mg/dL
Hgb A1c MFr Bld: 5.9 % — ABNORMAL HIGH (ref 4.8–5.6)

## 2023-07-08 LAB — HIV ANTIBODY (ROUTINE TESTING W REFLEX): HIV Screen 4th Generation wRfx: NONREACTIVE

## 2023-07-12 ENCOUNTER — Telehealth: Payer: Self-pay | Admitting: Family

## 2023-07-12 LAB — CYTOLOGY - PAP
Adequacy: ABSENT
Comment: NEGATIVE
Diagnosis: NEGATIVE
High risk HPV: NEGATIVE

## 2023-07-12 NOTE — Telephone Encounter (Signed)
 Copied from CRM [id number]. Topic: Referral - Status >> Jul 12, 2023  1:29 PM Ja-Kwan M wrote: Reason for CRM: Pt stated that she was told that she needs a referral for a diagnostic mammogram.

## 2023-07-14 ENCOUNTER — Other Ambulatory Visit: Payer: Self-pay | Admitting: Family

## 2023-07-14 DIAGNOSIS — Z1231 Encounter for screening mammogram for malignant neoplasm of breast: Secondary | ICD-10-CM

## 2023-07-14 NOTE — Telephone Encounter (Signed)
 Complete

## 2023-07-17 DIAGNOSIS — Z419 Encounter for procedure for purposes other than remedying health state, unspecified: Secondary | ICD-10-CM | POA: Diagnosis not present

## 2023-07-21 ENCOUNTER — Ambulatory Visit: Payer: Medicaid Other | Admitting: Family

## 2023-08-01 ENCOUNTER — Ambulatory Visit: Payer: Medicaid Other | Admitting: Urology

## 2023-08-03 ENCOUNTER — Ambulatory Visit: Payer: Medicaid Other

## 2023-08-04 ENCOUNTER — Ambulatory Visit: Payer: Medicaid Other | Admitting: Urology

## 2023-08-06 DIAGNOSIS — Z419 Encounter for procedure for purposes other than remedying health state, unspecified: Secondary | ICD-10-CM | POA: Diagnosis not present

## 2023-08-12 ENCOUNTER — Ambulatory Visit (INDEPENDENT_AMBULATORY_CARE_PROVIDER_SITE_OTHER): Payer: Medicaid Other | Admitting: Family

## 2023-08-12 VITALS — BP 123/83 | HR 92 | Temp 98.0°F | Ht 66.0 in | Wt 205.2 lb

## 2023-08-12 DIAGNOSIS — Z13220 Encounter for screening for lipoid disorders: Secondary | ICD-10-CM | POA: Diagnosis not present

## 2023-08-12 DIAGNOSIS — M79601 Pain in right arm: Secondary | ICD-10-CM | POA: Diagnosis not present

## 2023-08-12 DIAGNOSIS — F32A Depression, unspecified: Secondary | ICD-10-CM

## 2023-08-12 DIAGNOSIS — F419 Anxiety disorder, unspecified: Secondary | ICD-10-CM | POA: Diagnosis not present

## 2023-08-12 MED ORDER — SERTRALINE HCL 100 MG PO TABS: 100.0000 mg | ORAL_TABLET | Freq: Every day | ORAL | 2 refills | Status: DC

## 2023-08-12 MED ORDER — HYDROXYZINE PAMOATE 100 MG PO CAPS: 100.0000 mg | ORAL_CAPSULE | Freq: Four times a day (QID) | ORAL | 2 refills | Status: DC | PRN

## 2023-08-12 MED ORDER — TRIAMCINOLONE ACETONIDE 40 MG/ML IJ SUSP: 40.0000 mg | Freq: Once | INTRAMUSCULAR | Status: DC

## 2023-08-12 MED ORDER — KETOROLAC TROMETHAMINE 30 MG/ML IJ SOLN: 30.0000 mg | Freq: Once | INTRAMUSCULAR | Status: AC

## 2023-08-12 NOTE — Progress Notes (Signed)
 Patient ID: Maria Blackburn, female    DOB: [birth year]  MRN: [id number]  CC: Follow-Up  Subjective: Maria Blackburn is a 53 y.o. female who presents for follow-up.   Her concerns today include:  - Right arm pain for at least 1 year. Denies recent trauma/injury and red flag symptoms. States work-related from 2023. Would like pain injections today in office. Requests referral to Orthopedics.  - Anxiety depression persisting and affecting sleep. She denies thoughts of self-harm, suicidal ideations, homicidal ideations. - Cholesterol check.   Patient Active Problem List   Diagnosis Date Noted   De Quervain's tenosynovitis, left 09/10/2021   Lateral epicondylitis, right elbow 09/10/2021   Allergy to environmental factors 10/07/2017     Current Outpatient Medications on File Prior to Visit  Medication Sig Dispense Refill   acetaminophen  (TYLENOL ) 500 MG tablet Take 1 tablet (500 mg total) by mouth every 6 (six) hours as needed. 10 tablet 0   dimenhyDRINATE (DRAMAMINE) 50 MG tablet Take 50 mg by mouth every 8 (eight) hours as needed.     ibuprofen  (ADVIL ) 600 MG tablet Take 1 tablet (600 mg total) by mouth 3 (three) times daily. 9 tablet 0   phentermine  30 MG capsule Take 1 capsule (30 mg total) by mouth every morning. 30 capsule 0   solifenacin  (VESICARE ) 10 MG tablet Take 1 tablet (10 mg total) by mouth daily. 30 tablet 11   No current facility-administered medications on file prior to visit.    No Known Allergies  Social History   Socioeconomic History   Marital status: Married    Spouse name: Not on file   Number of children: Not on file   Years of education: Not on file   Highest education level: Not on file  Occupational History   Not on file  Tobacco Use   Smoking status: Never   Smokeless tobacco: Never  Substance and Sexual Activity   Alcohol use: No   Drug use: No   Sexual activity: Not on file  Other Topics Concern   Not on file  Social History Narrative   Not  on file   Social Drivers of Health   Financial Resource Strain: Low Risk  (03/25/2023)   Overall Financial Resource Strain (CARDIA)    Difficulty of Paying Living Expenses: Not very hard  Food Insecurity: Patient Unable To Answer (03/25/2023)   Hunger Vital Sign    Worried About Running Out of Food in the Last Year: Patient unable to answer    Ran Out of Food in the Last Year: Patient unable to answer  Transportation Needs: Patient Unable To Answer (03/25/2023)   PRAPARE - Transportation    Lack of Transportation (Medical): Patient unable to answer    Lack of Transportation (Non-Medical): Patient unable to answer  Physical Activity: Patient Unable To Answer (03/25/2023)   Exercise Vital Sign    Days of Exercise per Week: Patient unable to answer    Minutes of Exercise per Session: Patient unable to answer  Stress: Patient Unable To Answer (03/25/2023)   [employer] of Occupational Health - Occupational Stress Questionnaire    Feeling of Stress : Patient unable to answer  Social Connections: Patient Unable To Answer (03/25/2023)   Social Connection and Isolation Panel [NHANES]    Frequency of Communication with Friends and Family: Patient unable to answer    Frequency of Social Gatherings with Friends and Family: Patient unable to answer    Attends Religious Services: Patient unable to answer  Active Member of Clubs or Organizations: Patient unable to answer    Attends Club or Organization Meetings: Patient unable to answer    Marital Status: Patient unable to answer  Intimate Partner Violence: Patient Unable To Answer (03/25/2023)   Humiliation, Afraid, Rape, and Kick questionnaire    Fear of Current or Ex-Partner: Patient unable to answer    Emotionally Abused: Patient unable to answer    Physically Abused: Patient unable to answer    Sexually Abused: Patient unable to answer    Family History  Problem Relation Age of Onset   Heart disease Mother    Diabetes Son     Past  Surgical History:  Procedure Laterality Date   ABDOMINAL HYSTERECTOMY  2000   ovaries remain   CYSTO WITH HYDRODISTENSION N/A 03/24/2017   Procedure: CYSTOSCOPY/HYDRODISTENSION INSTILLATION OF MARCAINE  AND PYRIDIUM ;  Surgeon: Watt Rush, MD;  Location: [facility] [place];  Service: Urology;  Laterality: N/A;   HYSTEROSCOPY WITH D & C  1999   POLYPECTOMY   TUBAL LIGATION Bilateral 1996    ROS: Review of Systems Negative except as stated above  PHYSICAL EXAM: BP 123/83   Pulse 92   Temp 98 F (36.7 C) (Oral)   Ht 5' 6 (1.676 m)   Wt 205 lb 3.2 oz (93.1 kg)   SpO2 94%   BMI 33.12 kg/m   Physical Exam HENT:     Head: Normocephalic and atraumatic.     Nose: Nose normal.     Mouth/Throat:     Mouth: Mucous membranes are moist.     Pharynx: Oropharynx is clear.  Eyes:     Extraocular Movements: Extraocular movements intact.     Conjunctiva/sclera: Conjunctivae normal.     Pupils: Pupils are equal, round, and reactive to light.  Cardiovascular:     Rate and Rhythm: Normal rate and regular rhythm.     Pulses: Normal pulses.     Heart sounds: Normal heart sounds.  Pulmonary:     Effort: Pulmonary effort is normal.     Breath sounds: Normal breath sounds.  Musculoskeletal:        General: Normal range of motion.     Right shoulder: Normal.     Left shoulder: Normal.     Right upper arm: Normal.     Left upper arm: Normal.     Right elbow: Normal.     Left elbow: Normal.     Right forearm: Normal.     Left forearm: Normal.     Right wrist: Normal.     Left wrist: Normal.     Right hand: Normal.     Left hand: Normal.     Cervical back: Normal, normal range of motion and neck supple.     Thoracic back: Normal.     Lumbar back: Normal.     Right hip: Normal.     Left hip: Normal.     Right upper leg: Normal.     Left upper leg: Normal.     Right knee: Normal.     Left knee: Normal.     Right lower leg: Normal.     Left lower leg: Normal.     Right  ankle: Normal.     Left ankle: Normal.     Right foot: Normal.     Left foot: Normal.  Neurological:     General: No focal deficit present.     Mental Status: She is alert and oriented to person, place,  and time.  Psychiatric:        Mood and Affect: Mood normal.        Behavior: Behavior normal.     ASSESSMENT AND PLAN: 1. Right arm pain (Primary) - Ketorolac  injection and Triamcinolone  Acetonide injection administered in office.  - Referral to Orthopedic Surgery for evaluation/management.  - Follow-up with primary provider as scheduled. - ketorolac  (TORADOL ) 30 MG/ML injection 30 mg - triamcinolone  acetonide (KENALOG -40) injection 40 mg - Ambulatory referral to Orthopedic Surgery  2. Anxiety and depression - Patient denies thoughts of self-harm, suicidal ideations, homicidal ideations. - Increase Sertraline  from 50 mg to 100 mg as prescribed. Counseled on medication adherence/adverse effects.  - Increase Hydroxyzine  from 50 mg to 100 mg as prescribed. Counseled on medication adherence/adverse effects. - Patient declined referral to Psychiatry. - Follow-up with primary provider in 4 weeks or sooner if needed.  - hydrOXYzine  (VISTARIL ) 100 MG capsule; Take 1 capsule (100 mg total) by mouth every 6 (six) hours as needed.  Dispense: 30 capsule; Refill: 2 - sertraline  (ZOLOFT ) 100 MG tablet; Take 1 tablet (100 mg total) by mouth daily.  Dispense: 30 tablet; Refill: 2  3. Screening cholesterol level - Routine screening.  - Lipid panel    Patient was given the opportunity to ask questions.  Patient verbalized understanding of the plan and was able to repeat key elements of the plan. Patient was given clear instructions to go to Emergency Department or return to medical center if symptoms don't improve, worsen, or new problems develop.The patient verbalized understanding.   Orders Placed This Encounter  Procedures   Lipid panel   Ambulatory referral to Orthopedic Surgery      Requested Prescriptions   Signed Prescriptions Disp Refills   hydrOXYzine  (VISTARIL ) 100 MG capsule 30 capsule 2    Sig: Take 1 capsule (100 mg total) by mouth every 6 (six) hours as needed.   sertraline  (ZOLOFT ) 100 MG tablet 30 tablet 2    Sig: Take 1 tablet (100 mg total) by mouth daily.    Follow-up with primary provider as scheduled.   Greig JINNY Drones, NP

## 2023-08-12 NOTE — Progress Notes (Signed)
 Patient states she wants another shot for pain.  Asking for referral to an orthopedics.  Asking about sleeping.  Wants Shingles Vaccine.

## 2023-08-13 LAB — LIPID PANEL
Chol/HDL Ratio: 4.6 {ratio} — ABNORMAL HIGH (ref 0.0–4.4)
Cholesterol, Total: 247 mg/dL — ABNORMAL HIGH (ref 100–199)
HDL: 54 mg/dL (ref >39)
LDL Chol Calc (NIH): 169 mg/dL — ABNORMAL HIGH (ref 0–99)
Triglycerides: 135 mg/dL (ref 0–149)
VLDL Cholesterol Cal: 24 mg/dL (ref 5–40)

## 2023-08-15 ENCOUNTER — Other Ambulatory Visit: Payer: Self-pay | Admitting: Family

## 2023-08-15 ENCOUNTER — Encounter: Payer: Self-pay | Admitting: Family

## 2023-08-15 DIAGNOSIS — E785 Hyperlipidemia, unspecified: Secondary | ICD-10-CM

## 2023-08-15 MED ORDER — ATORVASTATIN CALCIUM 20 MG PO TABS: 20.0000 mg | ORAL_TABLET | Freq: Every day | ORAL | 0 refills | Status: DC

## 2023-08-16 ENCOUNTER — Ambulatory Visit
Admission: RE | Admit: 2023-08-16 | Discharge: 2023-08-16 | Disposition: A | Discharge status: Home / Self Care | Payer: Medicaid Other | Source: Ambulatory Visit | Attending: Family | Admitting: Family

## 2023-08-16 DIAGNOSIS — Z1231 Encounter for screening mammogram for malignant neoplasm of breast: Secondary | ICD-10-CM | POA: Diagnosis not present

## 2023-08-17 ENCOUNTER — Encounter: Payer: Self-pay | Admitting: Urology

## 2023-08-17 ENCOUNTER — Ambulatory Visit (INDEPENDENT_AMBULATORY_CARE_PROVIDER_SITE_OTHER): Payer: Medicaid Other | Admitting: Urology

## 2023-08-17 VITALS — BP 142/83 | HR 84 | Ht 66.0 in | Wt 205.0 lb

## 2023-08-17 DIAGNOSIS — R350 Frequency of micturition: Secondary | ICD-10-CM

## 2023-08-17 DIAGNOSIS — N3281 Overactive bladder: Secondary | ICD-10-CM | POA: Diagnosis not present

## 2023-08-17 DIAGNOSIS — N3010 Interstitial cystitis (chronic) without hematuria: Secondary | ICD-10-CM | POA: Diagnosis not present

## 2023-08-17 LAB — URINALYSIS, ROUTINE W REFLEX MICROSCOPIC
Bilirubin, UA: NEGATIVE
Glucose, UA: NEGATIVE
Ketones, UA: NEGATIVE
Leukocytes,UA: NEGATIVE
Nitrite, UA: NEGATIVE
Protein,UA: NEGATIVE
RBC, UA: NEGATIVE
Specific Gravity, UA: 1.02 (ref 1.005–1.030)
Urobilinogen, Ur: 0.2 mg/dL (ref 0.2–1.0)
pH, UA: 6 (ref 5.0–7.5)

## 2023-08-17 NOTE — Progress Notes (Signed)
Assessment: 1. OAB (overactive bladder)   2. Urinary frequency   3. Interstitial cystitis     Plan: Continue bladder diet Continue Solifenacin.  Discussed a trial of 5 mg daily or 10 mg every other day to see if this may reduce the dry mouth. Return to office in 4 months.  Chief Complaint:  Chief Complaint  Patient presents with   Over Active Bladder    History of Present Illness:  Maria Blackburn is a 53 y.o. female who is seen for further evaluation of urinary frequency, OAB, and interstitial cystitis.   She has a long history of urinary symptoms including frequency, urgency, nocturia, and bladder pain.  She was previously followed by Dr. Isabel Caprice and subsequently Dr. Annabell Howells for a diagnosis of interstitial cystitis.  She underwent cystoscopy with hydrodistention in September 2018.  Review of the operative note indicates that she had a bladder capacity of 750 mL and had glomerulations noted after hydrodistention.  She noted some improvement in her symptoms following this procedure.  She was subsequently referred for pelvic floor physical therapy but did not pursue this treatment.  She was also placed on hydroxyzine.  She has previously received intravesical therapy for IC. She had not been seen by urology for the past 5 years.  She continued to have significant urinary frequency, voiding multiple times per hour, urgency, bladder pain, nocturia every 1-2 hours, incontinence, decreased stream, and sensation of incomplete emptying.  She  noted some further worsening of her symptoms within the past 2 months.  She had been treated for UTIs without improvement in her bladder symptoms.  U/A from 03/25/23:  negative blood, negative LE, + nitrite She was treated with Macrobid. She continued with symptoms and was placed on Bactrim. She was seen on 04/04/23 for continued UTI symptoms and placed on Augmentin. U/A was negative for blood, negative for LE, and nitrite + Urine culture from 9/30//2024  showed no growth.  She was on Augmentin without any change in her symptoms.  No gross hematuria or flank pain. She was given a trial of Gemtesa 75 mg daily at her visit in 10/24. She reported improvement in her urinary symptoms with the Gemtesa.  No side effects.   She was given a trial of Solifenacin 10 mg daily in 11/24.  She returns today for follow-up.  She continues on Solifenacin 10 mg daily.  She reports improvement in her frequency, urgency, nocturia, incontinence, and bladder discomfort with the medication.  She does have occasional urgency and hesitancy.  She feels like she empties her bladder well.  No dysuria or gross hematuria.  She has noted a dry mouth with the medication.  She is currently taking the medication at night.  Portions of the above documentation were copied from a prior visit for review purposes only.   Past Medical History:  Past Medical History:  Diagnosis Date   Anxiety    Bladder spasms    Frequency of urination    GERD (gastroesophageal reflux disease)    Interstitial cystitis    Pelvic pain    SUI (stress urinary incontinence, female)    Urgency of urination     Past Surgical History:  Past Surgical History:  Procedure Laterality Date   ABDOMINAL HYSTERECTOMY  2000   ovaries remain   CYSTO WITH HYDRODISTENSION N/A 03/24/2017   Procedure: CYSTOSCOPY/HYDRODISTENSION INSTILLATION OF MARCAINE AND PYRIDIUM;  Surgeon: Bjorn Pippin, MD;  Location: [facility];  Service: Urology;  Laterality: N/A;   HYSTEROSCOPY  WITH D & C  1999   POLYPECTOMY   TUBAL LIGATION Bilateral 1996    Allergies:  No Known Allergies  Family History:  Family History  Problem Relation Age of Onset   Heart disease Mother    Diabetes Son     Social History:  Social History   Tobacco Use   Smoking status: Never   Smokeless tobacco: Never  Substance Use Topics   Alcohol use: No   Drug use: No    ROS: Constitutional:  Negative for fever, chills, weight  loss CV: Negative for chest pain, previous MI, hypertension Respiratory:  Negative for shortness of breath, wheezing, sleep apnea, frequent cough GI:  Negative for nausea, vomiting, bloody stool, GERD  Physical exam: BP (!) 142/83   Pulse 84   Ht 5\' 6"  (1.676 m)   Wt 205 lb (93 kg)   BMI 33.09 kg/m  GENERAL APPEARANCE:  Well appearing, well developed, well nourished, NAD HEENT:  Atraumatic, normocephalic, oropharynx clear NECK:  Supple without lymphadenopathy or thyromegaly ABDOMEN:  Soft, non-tender, no masses EXTREMITIES:  Moves all extremities well, without clubbing, cyanosis, or edema NEUROLOGIC:  Alert and oriented x 3, normal gait, CN II-XII grossly intact MENTAL STATUS:  appropriate BACK:  Non-tender to palpation, No CVAT SKIN:  Warm, dry, and intact  Results: U/A: negative

## 2023-08-23 ENCOUNTER — Encounter: Payer: Self-pay | Admitting: Family

## 2023-08-29 NOTE — Progress Notes (Signed)
   Office Visit Note   Patient: Maria Blackburn           Date of Birth: 20-Oct-1970           MRN: [id number] Visit Date: 08/30/2023              Requested by:  Rema Fendt, NP [address] [place],  [place] [address]  PCP: Rema Fendt, NP   Assessment & Plan: Visit Diagnoses: No diagnosis found.  Plan: ***  Follow-Up Instructions: No follow-ups on file.   Orders:  No orders of the defined types were placed in this encounter. No orders of the defined types were placed in this encounter.    Procedures: No procedures performed   Clinical Data: No additional findings.   Subjective: No chief complaint on file.  HPI  Review of Systems   Objective: Vital Signs: There were no vitals taken for this visit.  Physical Exam  Ortho Exam  Specialty Comments:  No specialty comments available.  Imaging: No results found.   PMFS History: Patient Active Problem List   Diagnosis Date Noted  . De Quervain's tenosynovitis, left 09/10/2021  . Lateral epicondylitis, right elbow 09/10/2021  . Allergy to environmental factors 10/07/2017   Past Medical History:  Diagnosis Date  . Anxiety   . Bladder spasms   . Frequency of urination   . GERD (gastroesophageal reflux disease)   . Interstitial cystitis   . Pelvic pain   . SUI (stress urinary incontinence, female)   . Urgency of urination     Family History  Problem Relation Age of Onset  . Heart disease Mother   . Diabetes Son     Past Surgical History:  Procedure Laterality Date  . ABDOMINAL HYSTERECTOMY  2000   ovaries remain  . CYSTO WITH HYDRODISTENSION N/A 03/24/2017   Procedure: CYSTOSCOPY/HYDRODISTENSION INSTILLATION OF MARCAINE AND PYRIDIUM;  Surgeon: Bjorn Pippin, MD;  Location: [facility];  Service: Urology;  Laterality: N/A;  . HYSTEROSCOPY WITH D & C  1999   POLYPECTOMY  . TUBAL LIGATION Bilateral 1996   Social History   Occupational History  . Not on file   Tobacco Use  . Smoking status: Never  . Smokeless tobacco: Never  Substance and Sexual Activity  . Alcohol use: No  . Drug use: No  . Sexual activity: Not on file

## 2023-08-30 ENCOUNTER — Other Ambulatory Visit (INDEPENDENT_AMBULATORY_CARE_PROVIDER_SITE_OTHER): Payer: Self-pay

## 2023-08-30 ENCOUNTER — Ambulatory Visit (INDEPENDENT_AMBULATORY_CARE_PROVIDER_SITE_OTHER): Payer: Medicaid Other | Admitting: Orthopaedic Surgery

## 2023-08-30 ENCOUNTER — Encounter: Payer: Self-pay | Admitting: Orthopaedic Surgery

## 2023-08-30 DIAGNOSIS — M79601 Pain in right arm: Secondary | ICD-10-CM

## 2023-08-30 DIAGNOSIS — G8929 Other chronic pain: Secondary | ICD-10-CM | POA: Diagnosis not present

## 2023-08-30 DIAGNOSIS — M25511 Pain in right shoulder: Secondary | ICD-10-CM

## 2023-08-31 ENCOUNTER — Encounter: Payer: Self-pay | Admitting: Orthopaedic Surgery

## 2023-08-31 ENCOUNTER — Other Ambulatory Visit: Payer: Self-pay | Admitting: Family

## 2023-08-31 DIAGNOSIS — Z7689 Persons encountering health services in other specified circumstances: Secondary | ICD-10-CM

## 2023-08-31 DIAGNOSIS — Z6832 Body mass index (BMI) 32.0-32.9, adult: Secondary | ICD-10-CM

## 2023-09-03 DIAGNOSIS — Z419 Encounter for procedure for purposes other than remedying health state, unspecified: Secondary | ICD-10-CM | POA: Diagnosis not present

## 2023-09-05 ENCOUNTER — Ambulatory Visit
Admission: RE | Admit: 2023-09-05 | Discharge: 2023-09-05 | Disposition: A | Discharge status: Home / Self Care | Payer: Medicaid Other | Source: Ambulatory Visit | Attending: Orthopaedic Surgery | Admitting: Orthopaedic Surgery

## 2023-09-05 DIAGNOSIS — G8929 Other chronic pain: Secondary | ICD-10-CM

## 2023-09-05 DIAGNOSIS — M25511 Pain in right shoulder: Secondary | ICD-10-CM | POA: Diagnosis not present

## 2023-09-05 DIAGNOSIS — M75111 Incomplete rotator cuff tear or rupture of right shoulder, not specified as traumatic: Secondary | ICD-10-CM | POA: Diagnosis not present

## 2023-09-05 DIAGNOSIS — M7581 Other shoulder lesions, right shoulder: Secondary | ICD-10-CM | POA: Diagnosis not present

## 2023-09-16 ENCOUNTER — Ambulatory Visit (INDEPENDENT_AMBULATORY_CARE_PROVIDER_SITE_OTHER): Admitting: Orthopaedic Surgery

## 2023-09-16 DIAGNOSIS — M7501 Adhesive capsulitis of right shoulder: Secondary | ICD-10-CM | POA: Diagnosis not present

## 2023-09-16 DIAGNOSIS — M75121 Complete rotator cuff tear or rupture of right shoulder, not specified as traumatic: Secondary | ICD-10-CM

## 2023-09-16 NOTE — Progress Notes (Signed)
 Office Visit Note   Patient: Maria Blackburn           Date of Birth: August 03, 1970           MRN: [id number] Visit Date: 09/16/2023              Requested by: Rema Fendt, NP [address] [place],  [place] [address] PCP: Rema Fendt, NP   Assessment & Plan: Visit Diagnoses:  1. Nontraumatic complete tear of right rotator cuff   2. Adhesive capsulitis of right shoulder     Plan: MRI shows a 1 x 1 cm tear of the supraspinatus and thickened joint capsule consistent with clinical findings of frozen shoulder.  Based on these findings I will make a referral to Dr. August Saucer for further evaluation and treatment.  I called her this afternoon to notify her of the referral and she is agreeable.  Follow-Up Instructions: No follow-ups on file.   Orders:  No orders of the defined types were placed in this encounter.  No orders of the defined types were placed in this encounter.     Procedures: No procedures performed   Clinical Data: No additional findings.   Subjective: Chief Complaint  Patient presents with   Right Shoulder - Follow-up    MRI review    HPI Maria Blackburn returns today to discuss right shoulder MRI scan. Review of Systems  Constitutional: Negative.   HENT: Negative.    Eyes: Negative.   Respiratory: Negative.    Cardiovascular: Negative.   Endocrine: Negative.   Musculoskeletal: Negative.   Neurological: Negative.   Hematological: Negative.   Psychiatric/Behavioral: Negative.    All other systems reviewed and are negative.    Objective: Vital Signs: There were no vitals taken for this visit.  Physical Exam Vitals and nursing note reviewed.  Constitutional:      Appearance: She is well-developed.  HENT:     Head: Normocephalic and atraumatic.  Pulmonary:     Effort: Pulmonary effort is normal.  Abdominal:     Palpations: Abdomen is soft.  Musculoskeletal:     Cervical back: Neck supple.  Skin:    General: Skin is warm.      Capillary Refill: Capillary refill takes less than 2 seconds.  Neurological:     Mental Status: She is alert and oriented to person, place, and time.  Psychiatric:        Behavior: Behavior normal.        Thought Content: Thought content normal.        Judgment: Judgment normal.     Ortho Exam Examination of the right shoulder shows flexion to about 100 degrees with pain.  External rotation 15 degrees. Specialty Comments:  No specialty comments available.  Imaging: No results found.   PMFS History: Patient Active Problem List   Diagnosis Date Noted   Adhesive capsulitis of right shoulder 09/16/2023   Nontraumatic complete tear of right rotator cuff 09/16/2023   De Quervain's tenosynovitis, left 09/10/2021   Lateral epicondylitis, right elbow 09/10/2021   Allergy to environmental factors 10/07/2017   Past Medical History:  Diagnosis Date   Anxiety    Bladder spasms    Frequency of urination    GERD (gastroesophageal reflux disease)    Interstitial cystitis    Pelvic pain    SUI (stress urinary incontinence, female)    Urgency of urination     Family History  Problem Relation Age of Onset   Heart disease Mother  Diabetes Son     Past Surgical History:  Procedure Laterality Date   ABDOMINAL HYSTERECTOMY  2000   ovaries remain   CYSTO WITH HYDRODISTENSION N/A 03/24/2017   Procedure: CYSTOSCOPY/HYDRODISTENSION INSTILLATION OF MARCAINE AND PYRIDIUM;  Surgeon: Bjorn Pippin, MD;  Location: [facility];  Service: Urology;  Laterality: N/A;   HYSTEROSCOPY WITH D & C  1999   POLYPECTOMY   TUBAL LIGATION Bilateral 1996   Social History   Occupational History   Not on file  Tobacco Use   Smoking status: Never   Smokeless tobacco: Never  Substance and Sexual Activity   Alcohol use: No   Drug use: No   Sexual activity: Not on file

## 2023-09-19 ENCOUNTER — Other Ambulatory Visit: Payer: Self-pay

## 2023-09-19 DIAGNOSIS — M7501 Adhesive capsulitis of right shoulder: Secondary | ICD-10-CM

## 2023-09-19 DIAGNOSIS — M75121 Complete rotator cuff tear or rupture of right shoulder, not specified as traumatic: Secondary | ICD-10-CM

## 2023-09-28 ENCOUNTER — Ambulatory Visit: Admitting: Orthopedic Surgery

## 2023-09-28 ENCOUNTER — Encounter: Payer: Self-pay | Admitting: Orthopedic Surgery

## 2023-09-28 ENCOUNTER — Other Ambulatory Visit: Payer: Self-pay

## 2023-09-28 ENCOUNTER — Encounter: Admitting: Orthopedic Surgery

## 2023-09-28 DIAGNOSIS — G8929 Other chronic pain: Secondary | ICD-10-CM

## 2023-09-28 DIAGNOSIS — M25511 Pain in right shoulder: Secondary | ICD-10-CM

## 2023-09-28 DIAGNOSIS — M7501 Adhesive capsulitis of right shoulder: Secondary | ICD-10-CM | POA: Diagnosis not present

## 2023-09-28 NOTE — Progress Notes (Signed)
 Office Visit Note   Patient: Maria Blackburn           Date of Birth: 22-Sep-1970           MRN: [id number] Visit Date: 09/28/2023 Requested by: Tarry Kos, MD [address] [place],  [place] [address] PCP: Rema Fendt, NP  Subjective: Chief Complaint  Patient presents with   Right Shoulder - Pain    Pain and mobility issues Has MRI  Some numbness and tingling at times    HPI: Maria Blackburn is a 53 y.o. female who presents to the office reporting right shoulder pain.  Since she was last seen she has had an MRI scan which is reviewed.  She has supraspinatus tendon tear with about a centimeter of retraction.  Mild thickening of the inferior capsule consistent with possible early frozen shoulder.  Patient does states her shoulder is painful at times.  She is right-hand dominant.  She does have a toddler at home and has to lift.  Describes some numbness and tingling in the right hand.  Has had pain in the shoulder since 2023.  Describes both weakness and pain in that right shoulder..                ROS: All systems reviewed are negative as they relate to the chief complaint within the history of present illness.  Patient denies fevers or chills.  Assessment & Plan: Visit Diagnoses:  1. Chronic right shoulder pain   2. Adhesive capsulitis of right shoulder     Plan: Impression is full-thickness rotator cuff tear in a young person who is not in a great place for surgery at this time.  Has a little bit of early frozen shoulder as well as some limitation of passive range of motion.  Plan at this time is ultrasound-guided injection into the right shoulder glenohumeral joint with home exercises and 95-month return with decision for or against surgical treatment at that time.  She is taking Motrin for her symptoms.  Follow-Up Instructions: No follow-ups on file.   Orders:  Orders Placed This Encounter  Procedures   US Guided Needle Placement - No Linked Charges   No orders of the  defined types were placed in this encounter.     Procedures: Large Joint Inj: R glenohumeral on 09/28/2023 12:25 PM Indications: diagnostic evaluation and pain Details: 22 G 3.5 in needle, ultrasound-guided posterior approach  Arthrogram: No  Medications: 9 mL bupivacaine 0.5 %; 40 mg methylPREDNISolone acetate 40 MG/ML; 5 mL lidocaine 1 % Outcome: tolerated well, no immediate complications Procedure, treatment alternatives, risks and benefits explained, specific risks discussed. Consent was given by the patient. Immediately prior to procedure a time out was called to verify the correct patient, procedure, equipment, support staff and site/side marked as required. Patient was prepped and draped in the usual sterile fashion.       Clinical Data: No additional findings.  Objective: Vital Signs: There were no vitals taken for this visit.  Physical Exam:  Constitutional: Patient appears well-developed HEENT:  Head: Normocephalic Eyes:EOM are normal Neck: Normal range of motion Cardiovascular: Normal rate Pulmonary/chest: Effort normal Neurologic: Patient is alert Skin: Skin is warm Psychiatric: Patient has normal mood and affect  Ortho Exam: Ortho exam demonstrates range of motion on the right of 35/85/115 range of motion on the left 70/120/125.  Rotator cuff strength is pretty reasonable to internal/external rotation.  Empty can testing shows a little weakness on the right compared  to the left.  Not too much in terms of coarseness or grinding with passive range of motion of that right shoulder at 90 degrees of abduction.  Cervical spine range of motion is full.  Specialty Comments:  No specialty comments available.  Imaging: US Guided Needle Placement - No Linked Charges Result Date: 09/28/2023 Ultrasound imaging demonstrates needle placement into the glenohumeral joint with injection of fluid into the joint and no complicating features.  Right shoulder     PMFS  History: Patient Active Problem List   Diagnosis Date Noted   Adhesive capsulitis of right shoulder 09/16/2023   Nontraumatic complete tear of right rotator cuff 09/16/2023   De Quervain's tenosynovitis, left 09/10/2021   Lateral epicondylitis, right elbow 09/10/2021   Allergy to environmental factors 10/07/2017   Past Medical History:  Diagnosis Date   Anxiety    Bladder spasms    Frequency of urination    GERD (gastroesophageal reflux disease)    Interstitial cystitis    Pelvic pain    SUI (stress urinary incontinence, female)    Urgency of urination     Family History  Problem Relation Age of Onset   Heart disease Mother    Diabetes Son     Past Surgical History:  Procedure Laterality Date   ABDOMINAL HYSTERECTOMY  2000   ovaries remain   CYSTO WITH HYDRODISTENSION N/A 03/24/2017   Procedure: CYSTOSCOPY/HYDRODISTENSION INSTILLATION OF MARCAINE AND PYRIDIUM;  Surgeon: Bjorn Pippin, MD;  Location: [facility];  Service: Urology;  Laterality: N/A;   HYSTEROSCOPY WITH D & C  1999   POLYPECTOMY   TUBAL LIGATION Bilateral 1996   Social History   Occupational History   Not on file  Tobacco Use   Smoking status: Never   Smokeless tobacco: Never  Substance and Sexual Activity   Alcohol use: No   Drug use: No   Sexual activity: Not on file

## 2023-09-29 ENCOUNTER — Encounter (HOSPITAL_COMMUNITY): Payer: Self-pay | Admitting: Psychiatry

## 2023-09-29 ENCOUNTER — Encounter (HOSPITAL_COMMUNITY): Payer: Self-pay

## 2023-09-29 ENCOUNTER — Emergency Department (HOSPITAL_COMMUNITY)

## 2023-09-29 ENCOUNTER — Inpatient Hospital Stay (HOSPITAL_COMMUNITY)
Admission: AD | Admit: 2023-09-29 | Discharge: 2023-10-04 | DRG: 885 | Disposition: A | Discharge status: Home / Self Care | Source: Intra-hospital | Attending: Psychiatry | Admitting: Psychiatry

## 2023-09-29 ENCOUNTER — Other Ambulatory Visit: Payer: Self-pay

## 2023-09-29 ENCOUNTER — Emergency Department (HOSPITAL_COMMUNITY)
Admission: EM | Admit: 2023-09-29 | Discharge: 2023-09-29 | Disposition: A | Discharge status: Home / Self Care | Source: Home / Self Care | Attending: Emergency Medicine | Admitting: Emergency Medicine

## 2023-09-29 DIAGNOSIS — F411 Generalized anxiety disorder: Secondary | ICD-10-CM | POA: Diagnosis not present

## 2023-09-29 DIAGNOSIS — Z87828 Personal history of other (healed) physical injury and trauma: Secondary | ICD-10-CM | POA: Diagnosis not present

## 2023-09-29 DIAGNOSIS — Z79899 Other long term (current) drug therapy: Secondary | ICD-10-CM

## 2023-09-29 DIAGNOSIS — F23 Brief psychotic disorder: Secondary | ICD-10-CM | POA: Insufficient documentation

## 2023-09-29 DIAGNOSIS — K219 Gastro-esophageal reflux disease without esophagitis: Secondary | ICD-10-CM | POA: Diagnosis not present

## 2023-09-29 DIAGNOSIS — Z825 Family history of asthma and other chronic lower respiratory diseases: Secondary | ICD-10-CM

## 2023-09-29 DIAGNOSIS — Z560 Unemployment, unspecified: Secondary | ICD-10-CM | POA: Diagnosis not present

## 2023-09-29 DIAGNOSIS — S0990XA Unspecified injury of head, initial encounter: Secondary | ICD-10-CM | POA: Diagnosis not present

## 2023-09-29 DIAGNOSIS — F329 Major depressive disorder, single episode, unspecified: Secondary | ICD-10-CM | POA: Insufficient documentation

## 2023-09-29 DIAGNOSIS — R4182 Altered mental status, unspecified: Secondary | ICD-10-CM | POA: Diagnosis not present

## 2023-09-29 DIAGNOSIS — R202 Paresthesia of skin: Secondary | ICD-10-CM | POA: Diagnosis not present

## 2023-09-29 DIAGNOSIS — E0590 Thyrotoxicosis, unspecified without thyrotoxic crisis or storm: Secondary | ICD-10-CM | POA: Diagnosis present

## 2023-09-29 DIAGNOSIS — F418 Other specified anxiety disorders: Secondary | ICD-10-CM | POA: Insufficient documentation

## 2023-09-29 DIAGNOSIS — F323 Major depressive disorder, single episode, severe with psychotic features: Secondary | ICD-10-CM | POA: Diagnosis not present

## 2023-09-29 DIAGNOSIS — Z8249 Family history of ischemic heart disease and other diseases of the circulatory system: Secondary | ICD-10-CM | POA: Diagnosis not present

## 2023-09-29 DIAGNOSIS — I10 Essential (primary) hypertension: Secondary | ICD-10-CM | POA: Diagnosis not present

## 2023-09-29 DIAGNOSIS — R440 Auditory hallucinations: Secondary | ICD-10-CM

## 2023-09-29 DIAGNOSIS — Z833 Family history of diabetes mellitus: Secondary | ICD-10-CM | POA: Diagnosis not present

## 2023-09-29 DIAGNOSIS — R9431 Abnormal electrocardiogram [ECG] [EKG]: Secondary | ICD-10-CM | POA: Diagnosis not present

## 2023-09-29 DIAGNOSIS — R0689 Other abnormalities of breathing: Secondary | ICD-10-CM | POA: Diagnosis not present

## 2023-09-29 DIAGNOSIS — R000 Tachycardia, unspecified: Secondary | ICD-10-CM | POA: Diagnosis not present

## 2023-09-29 DIAGNOSIS — R442 Other hallucinations: Secondary | ICD-10-CM | POA: Diagnosis not present

## 2023-09-29 LAB — RAPID URINE DRUG SCREEN, HOSP PERFORMED
Amphetamines: NOT DETECTED
Barbiturates: NOT DETECTED
Benzodiazepines: NOT DETECTED
Cocaine: NOT DETECTED
Opiates: NOT DETECTED
Tetrahydrocannabinol: NOT DETECTED

## 2023-09-29 LAB — URINALYSIS, ROUTINE W REFLEX MICROSCOPIC
Bilirubin Urine: NEGATIVE
Glucose, UA: NEGATIVE mg/dL
Hgb urine dipstick: NEGATIVE
Ketones, ur: 20 mg/dL — AB
Leukocytes,Ua: NEGATIVE
Nitrite: NEGATIVE
Protein, ur: NEGATIVE mg/dL
Specific Gravity, Urine: 1.004 — ABNORMAL LOW (ref 1.005–1.030)
pH: 7 (ref 5.0–8.0)

## 2023-09-29 LAB — CBC
HCT: 46.8 % — ABNORMAL HIGH (ref 36.0–46.0)
Hemoglobin: 15.4 g/dL — ABNORMAL HIGH (ref 12.0–15.0)
MCH: 29.2 pg (ref 26.0–34.0)
MCHC: 32.9 g/dL (ref 30.0–36.0)
MCV: 88.6 fL (ref 80.0–100.0)
Platelets: 353 10*3/uL (ref 150–400)
RBC: 5.28 MIL/uL — ABNORMAL HIGH (ref 3.87–5.11)
RDW: 13.2 % (ref 11.5–15.5)
WBC: 11.8 10*3/uL — ABNORMAL HIGH (ref 4.0–10.5)
nRBC: 0 % (ref 0.0–0.2)

## 2023-09-29 LAB — COMPREHENSIVE METABOLIC PANEL WITH GFR
ALT: 33 U/L (ref 0–44)
AST: 33 U/L (ref 15–41)
Albumin: 4.9 g/dL (ref 3.5–5.0)
Alkaline Phosphatase: 47 U/L (ref 38–126)
Anion gap: 13 (ref 5–15)
BUN: 12 mg/dL (ref 6–20)
CO2: 23 mmol/L (ref 22–32)
Calcium: 9.4 mg/dL (ref 8.9–10.3)
Chloride: 102 mmol/L (ref 98–111)
Creatinine, Ser: 0.7 mg/dL (ref 0.44–1.00)
GFR, Estimated: >60 mL/min (ref >60)
Glucose, Bld: 129 mg/dL — ABNORMAL HIGH (ref 70–99)
Potassium: 3.4 mmol/L — ABNORMAL LOW (ref 3.5–5.1)
Sodium: 138 mmol/L (ref 135–145)
Total Bilirubin: 1.9 mg/dL — ABNORMAL HIGH (ref 0.0–1.2)
Total Protein: 8 g/dL (ref 6.5–8.1)

## 2023-09-29 LAB — ACETAMINOPHEN LEVEL: Acetaminophen (Tylenol), Serum: <10 ug/mL — ABNORMAL LOW (ref 10–30)

## 2023-09-29 LAB — TSH: TSH: 0.262 u[IU]/mL — ABNORMAL LOW (ref 0.350–4.500)

## 2023-09-29 LAB — T4, FREE: Free T4: 1.19 ng/dL — ABNORMAL HIGH (ref 0.61–1.12)

## 2023-09-29 LAB — ETHANOL: Alcohol, Ethyl (B): <10 mg/dL (ref <10)

## 2023-09-29 LAB — SALICYLATE LEVEL: Salicylate Lvl: <7 mg/dL — ABNORMAL LOW (ref 7.0–30.0)

## 2023-09-29 MED ORDER — LORAZEPAM 2 MG/ML IJ SOLN: 2.0000 mg | Freq: Three times a day (TID) | INTRAMUSCULAR | Status: DC | PRN

## 2023-09-29 MED ORDER — RISPERIDONE 0.5 MG PO TBDP: 1.0000 mg | ORAL_TABLET | Freq: Every day | ORAL | Status: DC

## 2023-09-29 MED ORDER — DIPHENHYDRAMINE HCL 50 MG/ML IJ SOLN: 50.0000 mg | Freq: Three times a day (TID) | INTRAMUSCULAR | Status: DC | PRN

## 2023-09-29 MED ORDER — ACETAMINOPHEN 325 MG PO TABS
650.0000 mg | ORAL_TABLET | Freq: Four times a day (QID) | ORAL | Status: DC | PRN
  Administered 2023-09-30: 650 mg via ORAL
  Filled 2023-09-29: qty 2

## 2023-09-29 MED ORDER — HALOPERIDOL LACTATE 5 MG/ML IJ SOLN: 10.0000 mg | Freq: Three times a day (TID) | INTRAMUSCULAR | Status: DC | PRN

## 2023-09-29 MED ORDER — MAGNESIUM HYDROXIDE 400 MG/5ML PO SUSP: 30.0000 mL | Freq: Every day | ORAL | Status: DC | PRN

## 2023-09-29 MED ORDER — RISPERIDONE 1 MG PO TBDP
1.0000 mg | ORAL_TABLET | Freq: Every day | ORAL | Status: DC
  Filled 2023-09-29 (×2): qty 1

## 2023-09-29 MED ORDER — TRAZODONE HCL 50 MG PO TABS
50.0000 mg | ORAL_TABLET | Freq: Every evening | ORAL | Status: DC | PRN
  Administered 2023-09-30 (×2): 50 mg via ORAL
  Filled 2023-09-29 (×2): qty 1

## 2023-09-29 MED ORDER — SERTRALINE HCL 100 MG PO TABS
100.0000 mg | ORAL_TABLET | Freq: Every day | ORAL | Status: DC
  Administered 2023-09-30: 100 mg via ORAL
  Filled 2023-09-29 (×3): qty 1

## 2023-09-29 MED ORDER — HALOPERIDOL LACTATE 5 MG/ML IJ SOLN: 5.0000 mg | Freq: Three times a day (TID) | INTRAMUSCULAR | Status: DC | PRN

## 2023-09-29 MED ORDER — SERTRALINE HCL 50 MG PO TABS
100.0000 mg | ORAL_TABLET | Freq: Every day | ORAL | Status: DC
  Administered 2023-09-29: 100 mg via ORAL
  Filled 2023-09-29: qty 2

## 2023-09-29 MED ORDER — HALOPERIDOL 5 MG PO TABS: 5.0000 mg | ORAL_TABLET | Freq: Three times a day (TID) | ORAL | Status: DC | PRN

## 2023-09-29 MED ORDER — ALUM & MAG HYDROXIDE-SIMETH 200-200-20 MG/5ML PO SUSP: 30.0000 mL | ORAL | Status: DC | PRN

## 2023-09-29 MED ORDER — HYDROXYZINE PAMOATE 50 MG PO CAPS
100.0000 mg | ORAL_CAPSULE | Freq: Four times a day (QID) | ORAL | Status: DC | PRN
  Administered 2023-09-30: 100 mg via ORAL
  Filled 2023-09-29: qty 2

## 2023-09-29 MED ORDER — RISPERIDONE 0.5 MG PO TBDP
0.5000 mg | ORAL_TABLET | ORAL | Status: AC
  Administered 2023-09-29: 0.5 mg via ORAL
  Filled 2023-09-29: qty 1

## 2023-09-29 MED ORDER — DIPHENHYDRAMINE HCL 25 MG PO CAPS: 50.0000 mg | ORAL_CAPSULE | Freq: Three times a day (TID) | ORAL | Status: DC | PRN

## 2023-09-29 NOTE — Progress Notes (Signed)
 [facility] LCSW Progress Note   09/29/2023    7:26 PM  Maria Blackburn   [id number]   Type of Contact and Topic:  Psychiatric Bed Placement   Pt accepted to [facility] 302-1    Patient meets inpatient criteria per Alona Bene, [facility]     The attending provider will be Dr. Phineas Inches   Call report to [phone]    Lum Babe, RN @ [facility] notified.     Pt scheduled  to arrive at [facility] TODAY @ 2000.    Damita Dunnings, MSW, LCSW-A  7:28 PM 09/29/2023

## 2023-09-29 NOTE — Discharge Instructions (Signed)
 Your thyroid will need to be further evaluated as an outpatient.
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
## 2023-09-29 NOTE — ED Provider Notes (Signed)
 Maria Blackburn Provider Note   CSN: [id number] Arrival date & time: 09/29/23  1231     History  Chief Complaint  Patient presents with   Hallucinations   Urinary Frequency    Maria Blackburn is a 53 y.o. female.   Urinary Frequency  Patient presents with hallucinations.  Has been going for a few weeks now.  States she hears her knees talking and saying things like she is going to kill her.  States she will hear at home but also.  In the car and hurt at the doctor's office.  States she will also have conversations with her and her head.  States yesterday she started talking.  History of depression but no history of hallucinations.  Recently started on atorvastatin.  Does have interstitial cystitis but no recent change in her medicines.   Past Medical History:  Diagnosis Date   Anxiety    Bladder spasms    Frequency of urination    GERD (gastroesophageal reflux disease)    Interstitial cystitis    Pelvic pain    SUI (stress urinary incontinence, female)    Urgency of urination     Home Medications Prior to Admission medications   Medication Sig Start Date End Date Taking? Authorizing Provider  acetaminophen (TYLENOL) 500 MG tablet Take 1 tablet (500 mg total) by mouth every 6 (six) hours as needed. 04/24/22   Derwood Kaplan, MD  dimenhyDRINATE (DRAMAMINE) 50 MG tablet Take 50 mg by mouth every 8 (eight) hours as needed.    [provider]  hydrOXYzine (VISTARIL) 100 MG capsule Take 1 capsule (100 mg total) by mouth every 6 (six) hours as needed. 08/12/23   Rema Fendt, NP  ibuprofen (ADVIL) 600 MG tablet Take 1 tablet (600 mg total) by mouth 3 (three) times daily. 04/24/22   Derwood Kaplan, MD  phentermine 30 MG capsule Take 1 capsule (30 mg total) by mouth every morning. 04/04/23   Rema Fendt, NP  sertraline (ZOLOFT) 100 MG tablet Take 1 tablet (100 mg total) by mouth daily. 08/12/23   Rema Fendt, NP   solifenacin (VESICARE) 10 MG tablet Take 1 tablet (10 mg total) by mouth daily. 05/31/23   Stoneking, Danford Bad., MD      Allergies    Patient has no known allergies.    Review of Systems   Review of Systems  Genitourinary:  Positive for frequency.    Physical Exam Updated Vital Signs BP (!) 148/92 (BP Location: Right Arm)   Pulse 88   Temp 98.7 F (37.1 C) (Oral)   Resp 18   Ht 5\' 6"  (1.676 m)   Wt 93 kg   SpO2 100%   BMI 33.09 kg/m  Physical Exam Vitals and nursing note reviewed.  HENT:     Head: Normocephalic.  Eyes:     Pupils: Pupils are equal, round, and reactive to light.  Cardiovascular:     Rate and Rhythm: Regular rhythm.  Pulmonary:     Breath sounds: No wheezing.  Abdominal:     Tenderness: There is no abdominal tenderness.  Skin:    General: Skin is warm.  Neurological:     Mental Status: She is alert and oriented to person, place, and time.     ED Results / Procedures / Treatments   Labs (all labs ordered are listed, but only abnormal results are displayed) Labs Reviewed  URINALYSIS, ROUTINE W REFLEX MICROSCOPIC - Abnormal; Notable for  the following components:      Result Value   Color, Urine STRAW (*)    Specific Gravity, Urine 1.004 (*)    Ketones, ur 20 (*)    All other components within normal limits  COMPREHENSIVE METABOLIC PANEL WITH GFR - Abnormal; Notable for the following components:   Potassium 3.4 (*)    Glucose, Bld 129 (*)    Total Bilirubin 1.9 (*)    All other components within normal limits  SALICYLATE LEVEL - Abnormal; Notable for the following components:   Salicylate Lvl <7.0 (*)    All other components within normal limits  ACETAMINOPHEN LEVEL - Abnormal; Notable for the following components:   Acetaminophen (Tylenol), Serum <10 (*)    All other components within normal limits  CBC - Abnormal; Notable for the following components:   WBC 11.8 (*)    RBC 5.28 (*)    Hemoglobin 15.4 (*)    HCT 46.8 (*)    All other  components within normal limits  ETHANOL  RAPID URINE DRUG SCREEN, HOSP PERFORMED  TSH    EKG EKG Interpretation Date/Time:  Thursday September 29 2023 14:01:36 EDT Ventricular Rate:  73 PR Interval:  106 QRS Duration:  99 QT Interval:  394 QTC Calculation: 435 R Axis:   11  Text Interpretation: Sinus rhythm Short PR interval Confirmed by Benjiman Core [phone]) on 09/29/2023 2:43:25 PM  Radiology CT HEAD WO CONTRAST ( ) Result Date: 09/29/2023 CLINICAL DATA:  Head trauma.  Altered mental status. EXAM: CT HEAD WITHOUT CONTRAST TECHNIQUE: Contiguous axial images were obtained from the base of the skull through the vertex without intravenous contrast. RADIATION DOSE REDUCTION: This exam was performed according to the departmental dose-optimization program which includes automated exposure control, adjustment of the mA and/or kV according to patient size and/or use of iterative reconstruction technique. COMPARISON:  Head CT dated 04/24/2022. FINDINGS: Brain: The ventricles and sulci are appropriate size for the patient's age. The gray-white matter discrimination is preserved. There is no acute intracranial hemorrhage. No mass effect or midline shift. No extra-axial fluid collection. Vascular: No hyperdense vessel or unexpected calcification. Skull: Normal. Negative for fracture or focal lesion. Sinuses/Orbits: No acute finding. Other: None IMPRESSION: No acute intracranial pathology. Electronically Signed   By: Elgie Collard M.D.   On: 09/29/2023 14:46   US Guided Needle Placement - No Linked Charges Result Date: 09/28/2023 Ultrasound imaging demonstrates needle placement into the glenohumeral joint with injection of fluid into the joint and no complicating features.  Right shoulder    Procedures Procedures    Medications Ordered in ED Medications - No data to display  ED Course/ Medical Decision Making/ A&P                                 Medical Decision Making Amount and/or  Complexity of Data Reviewed Labs: ordered. Radiology: ordered.   Patient with hallucinations.  New onset over the last few weeks.  Differential diagnosis includes cause such as infection electrolyte abnormalities and intracranial mass.  Will get head CT and basic blood work.  Blood work reassuring although TSH is still pending.  Has had CT that is negative.  No clear medical cause for hallucinations seen.  Has had previous depression.  Patient is medically cleared for TTS evaluation.          Final Clinical Impression(s) / ED Diagnoses Final diagnoses:  Auditory hallucinations    Rx /  DC Orders ED Discharge Orders     None         Benjiman Core, MD 09/29/23 1455

## 2023-09-29 NOTE — ED Notes (Signed)
 Patient to room 43. Patient ambulated to room.  Patient calm and cooperative. Patient oriented to unit and room.

## 2023-09-29 NOTE — ED Notes (Signed)
 Called lab to add on T3 free and T4 free
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
## 2023-09-29 NOTE — ED Triage Notes (Signed)
 BIB EMS from home for auditory hallucinations. Pt states she is hearing her niece tell her to give her son back to her (pt is caregiver for son) and if she doesn't that she is going to kill her. Pt has been hearing these voices for a few weeks. Pt also has been having frequent urination and has missed multiple bladder pills she is prescribed. Denies SI or HI

## 2023-09-30 ENCOUNTER — Encounter (HOSPITAL_COMMUNITY): Payer: Self-pay

## 2023-09-30 DIAGNOSIS — F23 Brief psychotic disorder: Secondary | ICD-10-CM | POA: Diagnosis not present

## 2023-09-30 LAB — T3, FREE: T3, Free: 2.4 pg/mL (ref 2.0–4.4)

## 2023-09-30 MED ORDER — RISPERIDONE 1 MG PO TABS
1.0000 mg | ORAL_TABLET | Freq: Every day | ORAL | Status: DC
  Administered 2023-09-30 – 2023-10-03 (×4): 1 mg via ORAL
  Filled 2023-09-30 (×6): qty 1

## 2023-09-30 MED ORDER — HYDROXYZINE HCL 50 MG PO TABS
ORAL_TABLET | ORAL | Status: AC
  Filled 2023-09-30: qty 2

## 2023-09-30 MED ORDER — HYDROXYZINE HCL 50 MG PO TABS
100.0000 mg | ORAL_TABLET | Freq: Four times a day (QID) | ORAL | Status: DC | PRN
  Administered 2023-09-30 – 2023-10-01 (×2): 100 mg via ORAL
  Filled 2023-09-30 (×2): qty 2

## 2023-09-30 MED ORDER — SOLIFENACIN SUCCINATE 10 MG PO TABS
10.0000 mg | ORAL_TABLET | Freq: Every day | ORAL | Status: DC
  Administered 2023-10-02 – 2023-10-04 (×3): 10 mg via ORAL

## 2023-09-30 MED ORDER — SERTRALINE HCL 50 MG PO TABS
150.0000 mg | ORAL_TABLET | Freq: Every day | ORAL | Status: DC
  Administered 2023-10-01 – 2023-10-04 (×4): 150 mg via ORAL
  Filled 2023-09-30 (×6): qty 3

## 2023-09-30 NOTE — [facility] Group Notes (Signed)
 Adult Psychoeducational Group Note  Date:  09/30/2023 Time:  9:23 PM  Group Topic/Focus:  Wrap-Up Group:   The focus of this group is to help patients review their daily goal of treatment and discuss progress on daily workbooks.  Participation Level:  Active  Participation Quality:  Appropriate  Affect:  Appropriate  Cognitive:  Appropriate  Insight: Appropriate  Engagement in Group:  Engaged  Modes of Intervention:  Discussion and Support  Additional Comments:  Pt attended the evening AA group.  Christ Kick 09/30/2023, 9:23 PM

## 2023-09-30 NOTE — H&P (Signed)
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 were discussed in detail with the patient and time was given for questions. The patient consents to medication trial. FDA black box warnings, if present, were discussed.  The patient is agreeable with the medication plan, as above. We will monitor the patient's response to pharmacologic treatment, and adjust medications as necessary.  3. Routine and other pertinent labs: EKG monitoring: QTc: 435  Metabolism / endocrine: BMI: Body mass index is 30.99 kg/m. Prolactin: No results found for: "PROLACTIN" Lipid Panel: Lab Results  Component Value Date   CHOL 247 (H) 08/12/2023   TRIG 135 08/12/2023   HDL 54 08/12/2023   CHOLHDL 4.6 (H)  08/12/2023   VLDL 18 03/20/2016   LDLCALC 169 (H) 08/12/2023   LDLCALC 147 (H) 07/07/2023   HbgA1c: Hgb A1c MFr Bld (%)  Date Value  07/07/2023 5.9 (H)   TSH: TSH (uIU/mL)  Date Value  09/29/2023 0.262 (L)  07/07/2023 0.915    Drugs of Abuse     Component Value Date/Time   LABOPIA NONE DETECTED 09/29/2023 1300   COCAINSCRNUR NONE DETECTED 09/29/2023 1300   LABBENZ NONE DETECTED 09/29/2023 1300   AMPHETMU NONE DETECTED 09/29/2023 1300   THCU NONE DETECTED 09/29/2023 1300   LABBARB NONE DETECTED 09/29/2023 1300     4. Group Therapy:  -- Encouraged patient to participate in unit milieu and in scheduled group therapies   -- Short Term Goals: Ability to identify changes in lifestyle to reduce recurrence of condition, verbalize feelings, identify and develop effective coping behaviors, maintain clinical measurements within normal limits, and identify triggers associated with substance abuse/mental health issues will improve. Improvement in ability to disclose and discuss suicidal ideas, demonstrate self-control, and comply with prescribed medications.  -- Long Term Goals: Improvement in symptoms so as ready for discharge -- Patient is encouraged to participate in group therapy while admitted to the psychiatric unit. -- We will address other chronic and acute stressors, which contributed to the patient's Acute psychosis (HCC) in order to reduce the risk of self-harm at discharge.  5. Discharge Planning:   -- Social work and case management to assist with discharge planning and identification of hospital follow-up needs prior to discharge  -- Estimated LOS: 3-5 days  -- Discharge Concerns: Need to establish a safety plan; Medication compliance and effectiveness  -- Discharge Goals: Return home with outpatient referrals for mental health follow-up including  medication management/psychotherapy  I certify that inpatient services furnished can reasonably be expected to improve the  patient's condition.   Signed: Janith Lima, Medical Student 09/30/2023, 1:03 PM
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
## 2023-09-30 NOTE — Plan of Care (Signed)
   Problem: Education: Goal: Knowledge of [facility] Education information/materials will improve Outcome: Progressing   Problem: Activity: Goal: Interest or engagement in activities will improve Outcome: Progressing   Problem: Coping: Goal: Ability to verbalize frustrations and anger appropriately will improve Outcome: Progressing

## 2023-09-30 NOTE — Group Note (Signed)
 Recreation Therapy Group Note   Group Topic:Stress Management  Group Date: 09/30/2023 Start Time: 8295 End Time: 1002 Facilitators: Breda Bond-McCall, LRT,CTRS Location: [address] Hall Dayroom   Group Topic: Stress Management  Goal Area(s) Addresses:  Patient will identify positive stress management techniques. Patient will identify benefits of using stress management post d/c.  Intervention: Insight Timer App  Activity: LRT played a meditation that focused on not holding yourself back. Patients were to visualize what they saw for their future selves. Patients were to then acknowledge the things they feel are holding them back, accept them and work through them to be person they want themselves to be. LRT also explained to patients they could find more meditations on Youtube, apps, scripts, etc.    Education:  Stress Management, Discharge Planning.   Education Outcome: Acknowledges Education   Affect/Mood: Appropriate   Participation Level: Engaged   Participation Quality: Independent   Behavior: Appropriate   Speech/Thought Process: Focused   Insight: Good   Judgement: Good   Modes of Intervention: Meditation   Patient Response to Interventions:  Engaged   Education Outcome:  In group clarification offered    Clinical Observations/Individualized Feedback: Pt attended and participated in group.     Plan: Continue to engage patient in RT group sessions 2-3x/week.   Faye Strohman-McCall, LRT,CTRS  09/30/2023 12:11 PM
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
## 2023-09-30 NOTE — B[facility] Suicide Risk Assessment (Signed)
 [facility] Admission Suicide Risk Assessment   Nursing information obtained from:  Patient Demographic factors:  Unemployed, Caucasian, Low socioeconomic status Current Mental Status:  NA Loss Factors:  Financial problems / change in socioeconomic status, Decline in physical health Historical Factors:  Impulsivity Risk Reduction Factors:  Living with another person, especially a relative, Responsible for children under 54 years of age, Religious beliefs about death  Total Time spent with patient: 20 minutes Principal Problem: Acute psychosis (HCC) Diagnosis:  Principal Problem:   Acute psychosis (HCC)  Subjective Data:  53 year old Caucasian female, lives with her family, takes care of her niece daughter.  Background history of MDD.  Presented on account of new onset psychotic component.  She hears voices of her niece and her niece's boyfriend boyfriend threatening to harm her.  She has been very distressed and not able to sleep well at night.  She had thoughts of harming herself before they harm her.  No use of alcohol or any psychoactive substance.  No prior history of thyroid disorder. Routine labs significant for mild hyperthyroidism.  CT scan is negative.  UDS is negative.  Continued Clinical Symptoms:  Alcohol Use Disorder Identification Test Final Score (AUDIT): 0 The "Alcohol Use Disorders Identification Test", Guidelines for Use in Primary Care, Second Edition.  World Science writer [facility]). Score between 0-7:  no or low risk or alcohol related problems. Score between 8-15:  moderate risk of alcohol related problems. Score between 16-19:  high risk of alcohol related problems. Score 20 or above:  warrants further diagnostic evaluation for alcohol dependence and treatment.   CLINICAL FACTORS:   Depression:   Insomnia Medical Diagnoses and Treatments/Surgeries   Musculoskeletal: Strength & Muscle Tone: within normal limits Gait & Station: normal Patient leans:  N/A  Psychiatric Specialty Exam:  Presentation  General Appearance: Casually dressed, perplexed and worried look, needs reassurance.  Eye Contact: Good.  Speech: Slightly pressured but not loud.   Mood and Affect  Mood: Overwhelmed by her internal experience.  Affect: Blunted and mood congruent.  Thought Process  Thought Processes: Normal speed of thought.  Goal directed but slightly circumstantial.  Descriptions of Associations: Good.  Orientation: Oriented to self place and time.  Thought Content: Persecutory and paranoid delusion.  Preoccupied with threats from the hallucinations.  No current suicidal thoughts.  No current homicidal thoughts.  No current thoughts of violence.  Feels safe in the hospital setting.  Hallucinations: Auditory hallucinations.  Sensorium  Memory: Did not assess at this time.  Judgment:  Fair.  Insight: Good with that she is aware of her mental illness.  She is willing to take medication.  Executive Functions  Concentration: Good.  Attention Span: Good.  Recall: Did not assess as patient is currently psychotic.  Fund of Knowledge: Good.  Language: Good.  Psychomotor Activity  Normal psychomotor activity.  Physical Exam: Physical Exam ROS Blood pressure 132/88, pulse 95, temperature 98.7 F (37.1 C), temperature source Oral, resp. rate 18, height 5\' 6"  (1.676 m), weight 87.1 kg, SpO2 97%. Body mass index is 30.99 kg/m.   COGNITIVE FEATURES THAT CONTRIBUTE TO RISK:  Loss of executive function    SUICIDE RISK:   Moderate:  Frequent suicidal ideation with limited intensity, and duration, some specificity in terms of plans, no associated intent, good self-control, limited dysphoria/symptomatology, some risk factors present, and identifiable protective factors, including available and accessible social support.  PLAN OF CARE:   Patient will be on every 15 minute checks for suicide.  We  will initiate risperidone  to target psychosis.  We will recommend endocrinology follow-up as thyrotoxicosis could be the etiology.  I certify that inpatient services furnished can reasonably be expected to improve the patient's condition.   Georgiann Cocker, MD 09/30/2023, 11:55 AM

## 2023-09-30 NOTE — Tx Team (Signed)
 Initial Treatment Plan 09/30/2023 12:59 AM Patrina Levering VOZ:[id number]    PATIENT STRESSORS: Financial difficulties   Health problems   Marital or family conflict     PATIENT STRENGTHS: Active sense of humor  Communication skills  Supportive family/friends    PATIENT IDENTIFIED PROBLEMS: Hallucination  Anxiety  " Get rid of voices"                 DISCHARGE CRITERIA:  Improved stabilization in mood, thinking, and/or behavior Reduction of life-threatening or endangering symptoms to within safe limits Verbal commitment to aftercare and medication compliance  PRELIMINARY DISCHARGE PLAN: Attend PHP/IOP Outpatient therapy Return to previous living arrangement  PATIENT/FAMILY INVOLVEMENT: This treatment plan has been presented to and reviewed with the patient, Maria Blackburn, and/or family member.  The patient and family have been given the opportunity to ask questions and make suggestions.  Bethann Punches, RN 09/30/2023, 12:59 AM

## 2023-09-30 NOTE — BH IP Treatment Plan (Signed)
 Interdisciplinary Treatment and Diagnostic Plan Update  09/30/2023 Time of Session: 10:50 AM JOYE WESENBERG MRN: [id number]  Principal Diagnosis: Acute psychosis [facility])  Secondary Diagnoses: Principal Problem:   Acute psychosis (HCC)   Current Medications:  Current Facility-Administered Medications  Medication Dose Route Frequency Provider Last Rate Last Admin   acetaminophen (TYLENOL) tablet 650 mg  650 mg Oral Q6H PRN Motley-Mangrum, Jadeka A, PMHNP   650 mg at 09/30/23 0925   alum & mag hydroxide-simeth (MAALOX/MYLANTA) 200-200-20 MG/5ML suspension 30 mL  30 mL Oral Q4H PRN Motley-Mangrum, Jadeka A, PMHNP       haloperidol (HALDOL) tablet 5 mg  5 mg Oral TID PRN Motley-Mangrum, Jadeka A, PMHNP       And   diphenhydrAMINE (BENADRYL) capsule 50 mg  50 mg Oral TID PRN Motley-Mangrum, Jadeka A, PMHNP       haloperidol lactate (HALDOL) injection 5 mg  5 mg Intramuscular TID PRN Motley-Mangrum, Jadeka A, PMHNP       And   diphenhydrAMINE (BENADRYL) injection 50 mg  50 mg Intramuscular TID PRN Motley-Mangrum, Jadeka A, PMHNP       And   LORazepam (ATIVAN) injection 2 mg  2 mg Intramuscular TID PRN Motley-Mangrum, Jadeka A, PMHNP       haloperidol lactate (HALDOL) injection 10 mg  10 mg Intramuscular TID PRN Motley-Mangrum, Jadeka A, PMHNP       And   diphenhydrAMINE (BENADRYL) injection 50 mg  50 mg Intramuscular TID PRN Motley-Mangrum, Jadeka A, PMHNP       And   LORazepam (ATIVAN) injection 2 mg  2 mg Intramuscular TID PRN Motley-Mangrum, Geralynn Ochs A, PMHNP       hydrOXYzine (ATARAX) tablet 100 mg  100 mg Oral Q6H PRN Massengill, Harrold Donath, MD   100 mg at 09/30/23 4098   magnesium hydroxide (MILK OF MAGNESIA) suspension 30 mL  30 mL Oral Daily PRN Motley-Mangrum, Jadeka A, PMHNP       risperiDONE (RISPERDAL) tablet 1 mg  1 mg Oral QHS Izediuno, Vincent A, MD       sertraline (ZOLOFT) tablet 100 mg  100 mg Oral Daily Motley-Mangrum, Jadeka A, PMHNP   100 mg at 09/30/23 1191   traZODone  (DESYREL) tablet 50 mg  50 mg Oral QHS PRN Motley-Mangrum, Jadeka A, PMHNP   50 mg at 09/30/23 0027   PTA Medications: Facility-Administered Medications Prior to Admission  Medication Dose Route Frequency Provider Last Rate Last Admin   [DISCONTINUED] triamcinolone acetonide (KENALOG-40) injection 40 mg  40 mg Intramuscular Once        Medications Prior to Admission  Medication Sig Dispense Refill Last Dose/Taking   atorvastatin (LIPITOR) 20 MG tablet Take 20 mg by mouth daily.   Taking   hydrOXYzine (VISTARIL) 100 MG capsule Take 1 capsule (100 mg total) by mouth every 6 (six) hours as needed. 30 capsule 2    sertraline (ZOLOFT) 100 MG tablet Take 1 tablet (100 mg total) by mouth daily. 30 tablet 2    solifenacin (VESICARE) 10 MG tablet Take 1 tablet (10 mg total) by mouth daily. 30 tablet 11     Patient Stressors: Financial difficulties   Health problems   Marital or family conflict    Patient Strengths: Active sense of humor  Communication skills  Supportive family/friends   Treatment Modalities: Medication Management, Group therapy, Case management,  1 to 1 session with clinician, Psychoeducation, Recreational therapy.   Physician Treatment Plan for Primary Diagnosis: Acute psychosis (HCC) Long Term Goal(s):  Short Term Goals:    Medication Management: Evaluate patient's response, side effects, and tolerance of medication regimen.  Therapeutic Interventions: 1 to 1 sessions, Unit Group sessions and Medication administration.  Evaluation of Outcomes: Not Progressing  Physician Treatment Plan for Secondary Diagnosis: Principal Problem:   Acute psychosis (HCC)  Long Term Goal(s):     Short Term Goals:       Medication Management: Evaluate patient's response, side effects, and tolerance of medication regimen.  Therapeutic Interventions: 1 to 1 sessions, Unit Group sessions and Medication administration.  Evaluation of Outcomes: Not Progressing   RN Treatment  Plan for Primary Diagnosis: Acute psychosis (HCC) Long Term Goal(s): Knowledge of disease and therapeutic regimen to maintain health will improve  Short Term Goals: Ability to remain free from injury will improve, Ability to verbalize frustration and anger appropriately will improve, Ability to demonstrate self-control, Ability to participate in decision making will improve, Ability to verbalize feelings will improve, Ability to disclose and discuss suicidal ideas, Ability to identify and develop effective coping behaviors will improve, and Compliance with prescribed medications will improve  Medication Management: RN will administer medications as ordered by provider, will assess and evaluate patient's response and provide education to patient for prescribed medication. RN will report any adverse and/or side effects to prescribing provider.  Therapeutic Interventions: 1 on 1 counseling sessions, Psychoeducation, Medication administration, Evaluate responses to treatment, Monitor vital signs and CBGs as ordered, Perform/monitor CIWA, COWS, AIMS and Fall Risk screenings as ordered, Perform wound care treatments as ordered.  Evaluation of Outcomes: Not Progressing   LCSW Treatment Plan for Primary Diagnosis: Acute psychosis (HCC) Long Term Goal(s): Safe transition to appropriate next level of care at discharge, Engage patient in therapeutic group addressing interpersonal concerns.  Short Term Goals: Engage patient in aftercare planning with referrals and resources, Increase social support, Increase ability to appropriately verbalize feelings, Increase emotional regulation, Facilitate acceptance of mental health diagnosis and concerns, Facilitate patient progression through stages of change regarding substance use diagnoses and concerns, and Identify triggers associated with mental health/substance abuse issues  Therapeutic Interventions: Assess for all discharge needs, 1 to 1 time with Social worker,  Explore available resources and support systems, Assess for adequacy in community support network, Educate family and significant other(s) on suicide prevention, Complete Psychosocial Assessment, Interpersonal group therapy.  Evaluation of Outcomes: Not Progressing   Progress in Treatment: Attending groups: Yes. Participating in groups: Yes. Taking medication as prescribed: Yes. Toleration medication: Yes. Family/Significant other contact made:  No, consents are pending Patient understands diagnosis: Yes. Discussing patient identified problems/goals with staff: Yes. Medical problems stabilized or resolved: Yes. Denies suicidal/homicidal ideation: Yes. Issues/concerns per patient self-inventory: No.  New problem(s) identified: No  New Short Term/Long Term Goal(s):     medication stabilization, elimination of SI thoughts, development of comprehensive mental wellness plan.   Patient Goals:  "I have depression, anxiety, and panic attacks.  I've never heard voices until now, but it's getting better."  Discharge Plan or Barriers:  Patient recently admitted. CSW will continue to follow and assess for appropriate referrals and possible discharge planning.   Reason for Continuation of Hospitalization: Anxiety Medication stabilization Suicidal ideation  Estimated Length of Stay:  5 - 7 days  Last 3 [place] Suicide Severity Risk Score: Flowsheet Row [facility] (Current[facility] Most recent reading at 09/30/2023 12:00 AM ED from 09/29/2023 in [facility] Department at [facility] Most recent reading at 09/29/2023 12:59  PM ED from 04/23/2022 in [facility] Emergency Department at [facility] Most recent reading at 04/23/2022 11:34 PM  C-SSRS RISK CATEGORY No Risk No Risk No Risk       Last PHQ 2/9 Scores:    09/29/2023    4:30 PM 04/04/2023    3:47 PM 02/02/2023    3:29 PM  Depression screen PHQ 2/9   Decreased Interest 1 1 2   Down, Depressed, Hopeless 1 3 2   PHQ - 2 Score 2 4 4   Altered sleeping 2 3 1   Tired, decreased energy 2 3 1   Change in appetite 1 1 1   Feeling bad or failure about yourself  0 0 0  Trouble concentrating 2 1 0  Moving slowly or fidgety/restless 0 0 0  Suicidal thoughts 0 0 0  PHQ-9 Score 9 12 7   Difficult doing work/chores  Somewhat difficult Somewhat difficult    Scribe for Treatment Team: Nyra Jabs 09/30/2023 1:38 PM

## 2023-10-01 DIAGNOSIS — F23 Brief psychotic disorder: Secondary | ICD-10-CM | POA: Diagnosis not present

## 2023-10-01 MED ORDER — BUPIVACAINE HCL 0.5 % IJ SOLN
9.0000 mL | INTRAMUSCULAR | Status: DC | PRN
  Administered 2023-09-28: 9 mL via INTRA_ARTICULAR

## 2023-10-01 MED ORDER — LIDOCAINE HCL 1 % IJ SOLN
5.0000 mL | INTRAMUSCULAR | Status: DC | PRN
  Administered 2023-09-28: 5 mL

## 2023-10-01 MED ORDER — METHYLPREDNISOLONE ACETATE 40 MG/ML IJ SUSP
40.0000 mg | INTRAMUSCULAR | Status: DC | PRN
  Administered 2023-09-28: 40 mg via INTRA_ARTICULAR

## 2023-10-01 MED ORDER — TRAZODONE HCL 50 MG PO TABS
25.0000 mg | ORAL_TABLET | Freq: Every evening | ORAL | Status: DC | PRN
  Administered 2023-10-02 – 2023-10-03 (×2): 25 mg via ORAL
  Filled 2023-10-01 (×2): qty 1

## 2023-10-01 NOTE — Progress Notes (Signed)
 Patient denies SI, HI, AVH, and pain. Scheduled medications administered to patient, per provider orders. Support and encouragement provided. Routine safety checks conducted every 15 minutes. Patient informed to notify staff with problems or concerns.Patient provided gatorade for vertigo. Staff assisted patient to restroom and aided in transfers. Patient contracts for safety and is compliant with medications and treatment plan. Patient receptive, calm, and cooperative. Remains safe on the unit.   10/01/23 0930  Psychosocial Assessment  Eye Contact Fair  Facial Expression Anxious;Worried  Affect Anxious  Surveyor, minerals Activity Slow  Appearance/Hygiene In scrubs  Behavior Characteristics Cooperative  Mood Depressed;Anxious  Thought Process  Coherency Circumstantial  Content Preoccupation  Delusions Controlled  Perception Hallucinations  Hallucination Auditory  Judgment Poor  Confusion None  Danger to Self  Current suicidal ideation? Denies  Agreement Not to Harm Self Yes  Description of Agreement verbal  Danger to Others  Danger to Others None reported or observed

## 2023-10-01 NOTE — Group Note (Signed)
 [facility] LCSW Group Therapy Note   Group Date: 10/01/2023 Start Time: 1000 End Time: 1110   Type of Therapy/Topic:  Group Therapy:  Emotion Regulation and Identifying source of stress/traumas  Participation Level:  Did Not Attend   Mood:  Description of Group:    The purpose of this group is to assist patients in learning to regulate negative emotions and experience positive emotions. Patients will be guided to discuss ways in which they have been vulnerable to their negative emotions. These vulnerabilities will be juxtaposed with experiences of positive emotions or situations, and patients challenged to use positive emotions to combat negative ones. Special emphasis will be placed on coping with negative emotions in conflict situations, and patients will process healthy conflict resolution skills.  Therapeutic Goals: Patient will identify two positive emotions or experiences to reflect on in order to balance out negative emotions:  Patient will label two or more emotions that they find the most difficult to experience:  Patient will be able to demonstrate positive conflict resolution skills through discussion or role plays:   Summary of Patient Progress:   Pt was invited but did not attend    Therapeutic Modalities:   Cognitive Behavioral Therapy Feelings Identification Dialectical Behavioral Therapy   Steffanie Dunn, LCSWA
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
## 2023-10-01 NOTE — [facility] Group Notes (Signed)
 [facility] Group Notes:  (Nursing/MHT/Case Management/Adjunct)  Date:  10/01/2023  Time:  8:06 PM  Type of Therapy:   Wrap Up Group  Participation Level:  Active  Participation Quality:  Appropriate  Affect:  Appropriate  Cognitive:  Appropriate  Insight:  Appropriate  Engagement in Group:  Engaged  Modes of Intervention:  Discussion  Summary of Progress/Problems: Patient participated in group appropriately.  Maria Blackburn 10/01/2023, 8:06 PM

## 2023-10-01 NOTE — Progress Notes (Signed)
 Pt reported dizziness upon awakening, BP was checked and WDL. Pt was assisted to the restroom by two staff members via wheelchair. Pt complains of tiredness and laid back down to rest with feet elevated. Wheelchair placed beside bed for future use.

## 2023-10-01 NOTE — Progress Notes (Signed)
   10/01/23 2217  Psych Admission Type (Psych Patients Only)  Admission Status Voluntary  Psychosocial Assessment  Patient Complaints Anxiety  Eye Contact Fair  Facial Expression Animated  Affect Appropriate to circumstance  Speech Logical/coherent  Interaction Assertive  Motor Activity Slow  Appearance/Hygiene In scrubs  Behavior Characteristics Appropriate to situation  Mood Anxious  Thought Process  Coherency Circumstantial  Content WDL  Delusions None reported or observed  Perception Hallucinations  Hallucination Auditory  Judgment Poor  Confusion None  Danger to Self  Current suicidal ideation? Denies  Agreement Not to Harm Self Yes  Description of Agreement verbal  Danger to Others  Danger to Others None reported or observed

## 2023-10-01 NOTE — Plan of Care (Signed)
  Problem: Education: Goal: Emotional status will improve Outcome: Progressing   Problem: Safety: Goal: Periods of time without injury will increase Outcome: Progressing   Problem: Coping: Goal: Will verbalize feelings Outcome: Progressing   Problem: Coping: Goal: Will verbalize feelings Outcome: Progressing
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
## 2023-10-02 DIAGNOSIS — F23 Brief psychotic disorder: Secondary | ICD-10-CM | POA: Diagnosis not present

## 2023-10-02 MED ORDER — HYDROXYZINE HCL 10 MG PO TABS
10.0000 mg | ORAL_TABLET | Freq: Four times a day (QID) | ORAL | Status: DC | PRN
  Administered 2023-10-04: 10 mg via ORAL
  Filled 2023-10-02: qty 1

## 2023-10-02 NOTE — Progress Notes (Signed)
   10/02/23 2145  Psych Admission Type (Psych Patients Only)  Admission Status Voluntary  Psychosocial Assessment  Patient Complaints Anxiety;Insomnia  Eye Contact Fair  Facial Expression Animated  Affect Appropriate to circumstance  Speech Logical/coherent  Interaction Assertive  Motor Activity Other (Comment) (WDL)  Appearance/Hygiene Unremarkable  Behavior Characteristics Appropriate to situation  Mood Anxious;Pleasant  Thought Process  Coherency WDL  Content WDL  Delusions None reported or observed  Perception WDL  Hallucination None reported or observed  Judgment Poor  Confusion None  Danger to Self  Current suicidal ideation? Denies  Agreement Not to Harm Self Yes  Description of Agreement verbal  Danger to Others  Danger to Others None reported or observed

## 2023-10-02 NOTE — Group Note (Signed)
 Date:  10/02/2023 Time:  9:08 AM  Group Topic/Focus:  Goals Group:   The focus of this group is to help patients establish daily goals to achieve during treatment and discuss how the patient can incorporate goal setting into their daily lives to aide in recovery. Orientation:   The focus of this group is to educate the patient on the purpose and policies of crisis stabilization and provide a format to answer questions about their admission.  The group details unit policies and expectations of patients while admitted.    Participation Level:  Active  Participation Quality:  Appropriate  Affect:  Appropriate  Cognitive:  Appropriate  Insight: Appropriate  Engagement in Group:  Engaged  Modes of Intervention:  Discussion and Orientation  Additional Comments: Goal is to go to all [facility] 10/02/2023, 9:08 AM

## 2023-10-02 NOTE — Plan of Care (Signed)
   Problem: Education: Goal: Emotional status will improve Outcome: Progressing   Problem: Education: Goal: Verbalization of understanding the information provided will improve Outcome: Progressing

## 2023-10-02 NOTE — Progress Notes (Signed)
 [facility] MD Progress Note  10/02/2023 10:54 AM Maria Blackburn  MRN:  [id number] Subjective:   53 year old Caucasian female, married, lives with her family, takes care of her niece daughter.  Background history of MDD.  Presented on account of new onset psychosis.  She hears voices of her niece and her niece's boyfriend boyfriend threatening to harm her.  She has been very distressed and not able to sleep well at night.  She had thoughts of harming herself before they harm her.  No use of alcohol or any psychoactive substance.  No prior history of thyroid disorder. Routine labs significant for mild hyperthyroidism.  CT scan is negative.  UDS is negative.  Chart reviewed today.  Patient discussed at multidisciplinary team meeting.  Nursing staff reports that patient slept well last night.  She has been adherent with her medications.  No PRNs psychotropic medications are required lately.  She has been participating with unit groups and therapeutic activities.  Seen today.  Patient tells me that she feels a lot better.  States that she has not had any voices for over 24 hours.  States that she feels like she is old self again.  She was able to sleep well at night.  She is not feeling dizzy today.  States that she had a shower this morning and she has been attending groups.  Her goal is to get in more groups today.  She feels safe in here.  States that she has been in contact with her family and hopes to go back home soon.  Wants to be sure that she will be on her medicine at discharge.  No suicidal thoughts.  No homicidal thoughts.  No thoughts of violence.  No delusional preoccupation. Encouraged to keep ventilating her feelings to staff.  Principal Problem: Acute psychosis (HCC) Diagnosis: Principal Problem:   Acute psychosis (HCC) Active Problems:   MDD (major depressive disorder)  Total Time spent with patient: 20 minutes  Past Psychiatric History: MDD recurrent  Past Medical History:  Past  Medical History:  Diagnosis Date   Anxiety    Bladder spasms    Frequency of urination    GERD (gastroesophageal reflux disease)    Interstitial cystitis    Pelvic pain    SUI (stress urinary incontinence, female)    Urgency of urination     Past Surgical History:  Procedure Laterality Date   ABDOMINAL HYSTERECTOMY  2000   ovaries remain   CYSTO WITH HYDRODISTENSION N/A 03/24/2017   Procedure: CYSTOSCOPY/HYDRODISTENSION INSTILLATION OF MARCAINE AND PYRIDIUM;  Surgeon: Bjorn Pippin, MD;  Location: [facility] [place];  Service: Urology;  Laterality: N/A;   HYSTEROSCOPY WITH D & C  1999   POLYPECTOMY   TUBAL LIGATION Bilateral 1996   Family History:  Family History  Problem Relation Age of Onset   Heart disease Mother    Diabetes Son    Family Psychiatric  History:  See H&P  Social History:  Social History   Substance and Sexual Activity  Alcohol Use No     Social History   Substance and Sexual Activity  Drug Use No    Social History   Socioeconomic History   Marital status: Married    Spouse name: Not on file   Number of children: Not on file   Years of education: Not on file   Highest education level: Not on file  Occupational History   Not on file  Tobacco Use   Smoking status: Never  Smokeless tobacco: Never  Substance and Sexual Activity   Alcohol use: No   Drug use: No   Sexual activity: Yes  Other Topics Concern   Not on file  Social History Narrative   Not on file   Social Drivers of Health   Financial Resource Strain: Low Risk  (03/25/2023)   Overall Financial Resource Strain (CARDIA)    Difficulty of Paying Living Expenses: Not very hard  Food Insecurity: Patient Declined (09/29/2023)   Hunger Vital Sign    Worried About Running Out of Food in the Last Year: Patient declined    Ran Out of Food in the Last Year: Patient declined  Transportation Needs: No Transportation Needs (09/29/2023)   PRAPARE - Scientist, research (physical sciences) (Medical): No    Lack of Transportation (Non-Medical): No  Physical Activity: Patient Unable To Answer (03/25/2023)   Exercise Vital Sign    Days of Exercise per Week: Patient unable to answer    Minutes of Exercise per Session: Patient unable to answer  Stress: Patient Unable To Answer (03/25/2023)   [employer] of Occupational Health - Occupational Stress Questionnaire    Feeling of Stress : Patient unable to answer  Social Connections: Patient Unable To Answer (03/25/2023)   Social Connection and Isolation Panel [NHANES]    Frequency of Communication with Friends and Family: Patient unable to answer    Frequency of Social Gatherings with Friends and Family: Patient unable to answer    Attends Religious Services: Patient unable to answer    Active Member of Clubs or Organizations: Patient unable to answer    Attends Banker Meetings: Patient unable to answer    Marital Status: Patient unable to answer    Current Medications: Current Facility-Administered Medications  Medication Dose Route Frequency Provider Last Rate Last Admin   acetaminophen (TYLENOL) tablet 650 mg  650 mg Oral Q6H PRN Motley-Mangrum, Jadeka A, PMHNP   650 mg at 09/30/23 0925   alum & mag hydroxide-simeth (MAALOX/MYLANTA) 200-200-20 MG/5ML suspension 30 mL  30 mL Oral Q4H PRN Motley-Mangrum, Jadeka A, PMHNP       haloperidol (HALDOL) tablet 5 mg  5 mg Oral TID PRN Motley-Mangrum, Jadeka A, PMHNP       And   diphenhydrAMINE (BENADRYL) capsule 50 mg  50 mg Oral TID PRN Motley-Mangrum, Jadeka A, PMHNP       haloperidol lactate (HALDOL) injection 5 mg  5 mg Intramuscular TID PRN Motley-Mangrum, Jadeka A, PMHNP       And   diphenhydrAMINE (BENADRYL) injection 50 mg  50 mg Intramuscular TID PRN Motley-Mangrum, Jadeka A, PMHNP       And   LORazepam (ATIVAN) injection 2 mg  2 mg Intramuscular TID PRN Motley-Mangrum, Jadeka A, PMHNP       haloperidol lactate (HALDOL) injection 10 mg  10 mg  Intramuscular TID PRN Motley-Mangrum, Jadeka A, PMHNP       And   diphenhydrAMINE (BENADRYL) injection 50 mg  50 mg Intramuscular TID PRN Motley-Mangrum, Jadeka A, PMHNP       And   LORazepam (ATIVAN) injection 2 mg  2 mg Intramuscular TID PRN Motley-Mangrum, Jadeka A, PMHNP       hydrOXYzine (ATARAX) tablet 10 mg  10 mg Oral Q6H PRN Glenn Gullickson A, MD       magnesium hydroxide (MILK OF MAGNESIA) suspension 30 mL  30 mL Oral Daily PRN Motley-Mangrum, Jadeka A, PMHNP       risperiDONE (RISPERDAL)  tablet 1 mg  1 mg Oral QHS Jenilyn Magana A, MD   1 mg at 10/01/23 2105   sertraline (ZOLOFT) tablet 150 mg  150 mg Oral Daily Peterson Ao, MD   150 mg at 10/02/23 0809   solifenacin (VESICARE) tablet 10 mg  10 mg Oral Daily Kimla Furth, Delight Ovens, MD   10 mg at 10/02/23 0809   traZODone (DESYREL) tablet 25 mg  25 mg Oral QHS PRN Waynette Towers, Delight Ovens, MD        Lab Results:  No results found for this or any previous visit (from the past 48 hours).   Blood Alcohol level:  Lab Results  Component Value Date   ETH <10 09/29/2023    Metabolic Disorder Labs: Lab Results  Component Value Date   HGBA1C 5.9 (H) 07/07/2023   No results found for: "PROLACTIN" Lab Results  Component Value Date   CHOL 247 (H) 08/12/2023   TRIG 135 08/12/2023   HDL 54 08/12/2023   CHOLHDL 4.6 (H) 08/12/2023   VLDL 18 03/20/2016   LDLCALC 169 (H) 08/12/2023   LDLCALC 147 (H) 07/07/2023    Physical Findings: AIMS:  , ,  ,  ,    CIWA:    COWS:     Musculoskeletal: Strength & Muscle Tone: within normal limits Gait & Station: normal Patient leans: N/A  Psychiatric Specialty Exam:  Presentation  General Appearance: Well-groomed, not in any distress, out in the milieu prior to interview.  No EPS.  Eye Contact: Good eye contact.  Speech: Spontaneous, normalizing rate, tone and volume.  Mood and Affect  Mood: Subjectively and objectively better.  Affect: Full range and mood  congruent.  Thought Process  Thought Processes: Normal speed of thought.  Linear and goal directed.  Orientation: Oriented to place person and time.  Thought Content: Persecutory delusions are taking the back burner.  No current suicidal thoughts.  No thoughts of violence.  No homicidal thoughts.  No negative rumination.  No guilty rumination.  No obsessions.  Hallucinations: No hallucination in any modality.  Sensorium  Memory: Good.  Judgment: Good.  Insight: Good.  Executive Functions  Concentration: Good.  Attention Span: Good.  Recall: Good.  Fund of Knowledge: Good.  Language: Normal.  Psychomotor Activity  Normal psychomotor activity.  Physical Exam: Physical Exam ROS Blood pressure 125/79, pulse 82, temperature 98.3 F (36.8 C), temperature source Oral, resp. rate 18, height 5\' 6"  (1.676 m), weight 87.1 kg, SpO2 98%. Body mass index is 30.99 kg/m.   Treatment Plan Summary: Patient presented with new onset psychosis associated with auditory hallucinations and persecutory delusions.  Thyroid function test indicates hyperthyroidism.  She has responded well to low-dose risperidone.  We optimized her antidepressant from 100 mg to 150 mg.  She seems to be responding appropriately.  No current dangerousness.  Psychosis seems to be resolving.  We will maintain her current regimen.  Hopeful discharge by mid week if she maintains progress.  She will need endocrinology follow-up.  1.  Continue risperidone 1 mg at bedtime. 2.  Continue sertraline 150 mg daily. 3.  Decrease as needed hydroxyzine to 10 mg as needed. 4.  Encourage unit groups and therapeutic activities. 5.  Continue to monitor mood behavior and interaction with others. 6.  Social worker will obtain collateral from her family. 7.  Social worker will coordinate discharge and aftercare planning.   Georgiann Cocker, MD 10/02/2023, 10:54 AM

## 2023-10-02 NOTE — Group Note (Signed)
 Date:  10/02/2023 Time:  9:06 PM  Group Topic/Focus:  Wrap-Up Group:   The focus of this group is to help patients review their daily goal of treatment and discuss progress on daily workbooks.    Participation Level:  Active  Participation Quality:  Appropriate  Affect:  Appropriate  Cognitive:  Appropriate  Insight: Appropriate  Engagement in Group:  Engaged  Modes of Intervention:  Education and Exploration  Additional Comments:  Patient attended and participated in group tonight. She reports that she did not hurt herself any further. Her goal today was to attend all her meeting. She meet her goals.  Lita Mains [facility] 10/02/2023, 9:06 PM

## 2023-10-02 NOTE — Plan of Care (Signed)
   Problem: Education: Goal: Emotional status will improve Outcome: Progressing Goal: Mental status will improve Outcome: Progressing

## 2023-10-02 NOTE — Progress Notes (Signed)
   10/02/23 0530  15 Minute Checks  Location Bedroom  Visual Appearance Calm  Behavior Sleeping  Sleep (Behavioral Health Patients Only)  Calculate sleep? (Click Yes once per 24 hr at 0600 safety check) Yes  Documented sleep last 24 hours 9

## 2023-10-02 NOTE — Progress Notes (Signed)
   10/02/23 0900  Psych Admission Type (Psych Patients Only)  Admission Status Voluntary  Psychosocial Assessment  Patient Complaints Anxiety  Eye Contact Fair  Facial Expression Animated  Affect Appropriate to circumstance  Speech Logical/coherent  Interaction Assertive  Motor Activity Slow  Appearance/Hygiene In scrubs  Behavior Characteristics Appropriate to situation  Mood Anxious  Thought Process  Coherency Circumstantial  Content WDL  Delusions None reported or observed  Perception Hallucinations  Hallucination Auditory (None reported at this time)  Judgment Poor  Confusion None  Danger to Self  Current suicidal ideation? Denies  Agreement Not to Harm Self Yes  Description of Agreement verbal  Danger to Others  Danger to Others None reported or observed

## 2023-10-03 DIAGNOSIS — F23 Brief psychotic disorder: Secondary | ICD-10-CM | POA: Diagnosis not present

## 2023-10-03 NOTE — Group Note (Signed)
 Date:  10/03/2023 Time:  11:00 AM  Group Topic/Focus:  Developing a Wellness Toolbox:   The focus of this group is to help patients develop a "wellness toolbox" with skills and strategies to promote recovery upon discharge. Dimensions of Wellness:   The focus of this group is to introduce the topic of wellness and discuss the role each dimension of wellness plays in total health.    Participation Level:  Active  Participation Quality:  Attentive  Affect:  Appropriate  Cognitive:  Appropriate  Insight: Appropriate  Engagement in Group:  Supportive  Modes of Intervention:  Socialization and Support  Additional Comments:  Patient acknowledges emotions and will choose positive thoughts.    Merlene Morse 10/03/2023, 11:00 AM

## 2023-10-03 NOTE — Plan of Care (Signed)
   Problem: Education: Goal: Verbalization of understanding the information provided will improve Outcome: Progressing   Problem: Activity: Goal: Interest or engagement in activities will improve Outcome: Progressing

## 2023-10-03 NOTE — Group Note (Signed)
 Date:  10/03/2023 Time:  9:50 AM  Group Topic/Focus:  Goals Group:   The focus of this group is to help patients establish daily goals to achieve during treatment and discuss how the patient can incorporate goal setting into their daily lives to aide in recovery.    Participation Level:  Active  Participation Quality:  Appropriate and Attentive  Affect:  Appropriate  Cognitive:  Alert and Appropriate  Insight: Appropriate  Engagement in Group:  Engaged and Supportive  Modes of Intervention:  Clarification, Socialization, and Support  Additional Comments:  Patient has clear goals set in place for today  Merlene Morse 10/03/2023, 9:50 AM

## 2023-10-03 NOTE — Group Note (Signed)
 Date:  10/03/2023 Time:  1:01 PM  Group Topic/Focus:  Building Self Esteem:   The Focus of this group is helping patients become aware of the effects of self-esteem on their lives, the things they and others do that enhance or undermine their self-esteem, seeing the relationship between their level of self-esteem and the choices they make and learning ways to enhance self-esteem.    Participation Level:  Active  Participation Quality:  Appropriate and Attentive  Affect:  Appropriate  Cognitive:  Alert and Appropriate  Insight: Appropriate  Engagement in Group:  Developing/Improving and Engaged  Modes of Intervention:  Problem-solving and Role-play  Additional Comments:  Patient was engaged in this exercise.    Merlene Morse 10/03/2023, 1:01 PM

## 2023-10-03 NOTE — [facility] Group Notes (Signed)
 [facility] Group Notes:  (Nursing/MHT/Case Management/Adjunct)  Date:  10/03/2023  Time:  2000  Type of Therapy:   Alcoholics Anonymous Meeting  Participation Level:  Active  Participation Quality:  Attentive and Supportive  Affect:  Appropriate  Cognitive:  Alert  Insight:  Improving  Engagement in Group:  Engaged  Modes of Intervention:  Clarification, Education, and Support  Summary of Progress/Problems:  Marcille Buffy 10/03/2023, 9:29 PM

## 2023-10-03 NOTE — Progress Notes (Signed)
 D:  Patient's self inventory sheet, patient sleeps good, no sleep medication.  Good appetite, normal energy level, good concentration.  Depression 1, home sick.  Denied hopeless and anxiety.  Denied withdrawals.  Denied SI.  Denied physical problems.  Denied physical pain.  Goal talk to MD about stress reducing plan.  Plans to work on at home stress plan, coping plan.  Has worked on a Leisure centre manager. A:  Medications administered per MD orders.  Emotional support and encouragement given patient. R:  Denied SI and HI, contracts for safety.  Denied A/V hallucinations.  Safety maintained with 15 minute checks. "Better than I was.  Good to create extra time for rest/ stress.  Will look for part time job."

## 2023-10-03 NOTE — Plan of Care (Signed)
 Nurse discussed anxiety, depression and coping skills with patient.

## 2023-10-03 NOTE — Progress Notes (Signed)
   10/03/23 2100  Psych Admission Type (Psych Patients Only)  Admission Status Voluntary  Psychosocial Assessment  Patient Complaints None  Eye Contact Fair  Facial Expression Animated  Affect Appropriate to circumstance  Speech Logical/coherent  Interaction Assertive  Motor Activity Slow  Appearance/Hygiene Unremarkable  Behavior Characteristics Cooperative;Appropriate to situation  Mood Pleasant  Thought Process  Coherency WDL  Content WDL  Delusions None reported or observed  Perception WDL  Hallucination None reported or observed  Judgment Poor  Confusion None  Danger to Self  Current suicidal ideation? Denies  Description of Suicide Plan none  Agreement Not to Harm Self Yes  Description of Agreement verbal  Danger to Others  Danger to Others None reported or observed

## 2023-10-03 NOTE — Progress Notes (Signed)
   10/03/23 0530  15 Minute Checks  Location Bedroom  Visual Appearance Calm  Behavior Sleeping  Sleep (Behavioral Health Patients Only)  Calculate sleep? (Click Yes once per 24 hr at 0600 safety check) Yes  Documented sleep last 24 hours 8.5

## 2023-10-03 NOTE — Group Note (Signed)
 Recreation Therapy Group Note   Group Topic:Healthy Decision Making  Group Date: 10/03/2023 Start Time: 5409 End Time: 1015 Facilitators: Claryssa Sandner-McCall, LRT,CTRS Location: [address] Dayroom   Group Topic: Decision Making, Problem Solving, Communication  Goal Area(s) Addresses:  Patient will effectively work with peer towards shared goal.  Patient will identify factors that guided their decision making.  Patient will pro-socially communicate ideas during group session.   Intervention: Survival Scenario - pencil, paper  Activity: Patients were given a scenario that they were going to be stranded on a deserted [place] for several months before being rescued. Writer tasked them with making a list of 15 things they would choose to bring with them for "survival". The list of items was prioritized most important to least. Each patient would come up with their own list, then work together to create a new list of 15 items while in a group of 3-5 peers. LRT discussed each person's list and how it differed from others. The debrief included discussion of priorities, good decisions versus bad decisions, and how it is important to think before acting so we can make the best decision possible. LRT tied the concept of effective communication among group members to patient's support systems outside of the hospital and its benefit post discharge.  Education: Pharmacist, community, Priorities, Support System, Discharge Planning   Education Outcome: Acknowledges education/In group clarification/Needs additional education   Affect/Mood: Appropriate   Participation Level: Engaged   Participation Quality: Independent   Behavior: Appropriate   Speech/Thought Process: Focused   Insight: Good   Judgement: Good   Modes of Intervention: Group work   Patient Response to Interventions:  Engaged   Education Outcome:  In group clarification offered    Clinical Observations/Individualized Feedback: Pt  was bright and engaged throughout group session. Pt came up with items such as grill, blankets, knife, family, etc on her own. With partner, they came up with things such as water, rope, tent, knives (swiss army), fishing pole/hooks, matches, etc.     Plan: Continue to engage patient in RT group sessions 2-3x/week.   Sieanna Vanstone-McCall, LRT,CTRS 10/03/2023 12:49 PM

## 2023-10-03 NOTE — Progress Notes (Signed)
 [facility] MD Progress Note  10/03/2023 12:46 PM Maria Blackburn  MRN:  [id number] Subjective:   53 year old Caucasian female, married, lives with her family, takes care of her niece daughter.  Background history of MDD.  Presented on account of new onset psychosis.  She hears voices of her niece and her niece's boyfriend boyfriend threatening to harm her.  She has been very distressed and not able to sleep well at night.  She had thoughts of harming herself before they harm her.  No use of alcohol or any psychoactive substance.  No prior history of thyroid disorder. Routine labs significant for mild hyperthyroidism.  CT scan is negative.  UDS is negative.  Chart reviewed today.  Patient discussed at multidisciplinary team meeting.  Nursing staff reports that patient slept well last night.  She has been adherent with her medications.  No PRNs psychotropic medications are required lately.  She has been participating with unit groups and therapeutic activities.  The patient was seen in her room during rounds.  She endorses anxiety and feeling subjectively depressed because she feels like she should be at home helping family.  She denies psychotic symptoms today, and feels like Risperdal has been quite helpful for her.  She also believes she is benefiting from the higher dose of sertraline.  She denies suicidal ideation, homicidal ideation, auditory hallucinations, or visual hallucinations.  She denies medication side effects.  She has insight into the need for follow-up with psychiatry and therapy after discharge.  If she continues to improve we will plan on discharging her tomorrow.  Principal Problem: Acute psychosis (HCC) Diagnosis: Principal Problem:   Acute psychosis (HCC) Active Problems:   MDD (major depressive disorder)  Total Time spent with patient: 30 minutes  Past Psychiatric History: MDD recurrent  Past Medical History:  Past Medical History:  Diagnosis Date   Anxiety    Bladder spasms     Frequency of urination    GERD (gastroesophageal reflux disease)    Interstitial cystitis    Pelvic pain    SUI (stress urinary incontinence, female)    Urgency of urination     Past Surgical History:  Procedure Laterality Date   ABDOMINAL HYSTERECTOMY  2000   ovaries remain   CYSTO WITH HYDRODISTENSION N/A 03/24/2017   Procedure: CYSTOSCOPY/HYDRODISTENSION INSTILLATION OF MARCAINE AND PYRIDIUM;  Surgeon: Bjorn Pippin, MD;  Location: [facility];  Service: Urology;  Laterality: N/A;   HYSTEROSCOPY WITH D & C  1999   POLYPECTOMY   TUBAL LIGATION Bilateral 1996   Family History:  Family History  Problem Relation Age of Onset   Heart disease Mother    Diabetes Son    Family Psychiatric  History:  See H&P  Social History:  Social History   Substance and Sexual Activity  Alcohol Use No     Social History   Substance and Sexual Activity  Drug Use No    Social History   Socioeconomic History   Marital status: Married    Spouse name: Not on file   Number of children: Not on file   Years of education: Not on file   Highest education level: Not on file  Occupational History   Not on file  Tobacco Use   Smoking status: Never   Smokeless tobacco: Never  Substance and Sexual Activity   Alcohol use: No   Drug use: No   Sexual activity: Yes  Other Topics Concern   Not on file  Social History Narrative  Not on file   Social Drivers of Health   Financial Resource Strain: Low Risk  (03/25/2023)   Overall Financial Resource Strain (CARDIA)    Difficulty of Paying Living Expenses: Not very hard  Food Insecurity: Patient Declined (09/29/2023)   Hunger Vital Sign    Worried About Running Out of Food in the Last Year: Patient declined    Ran Out of Food in the Last Year: Patient declined  Transportation Needs: No Transportation Needs (09/29/2023)   PRAPARE - Administrator, Civil Service (Medical): No    Lack of Transportation (Non-Medical):  No  Physical Activity: Patient Unable To Answer (03/25/2023)   Exercise Vital Sign    Days of Exercise per Week: Patient unable to answer    Minutes of Exercise per Session: Patient unable to answer  Stress: Patient Unable To Answer (03/25/2023)   [employer] of Occupational Health - Occupational Stress Questionnaire    Feeling of Stress : Patient unable to answer  Social Connections: Patient Unable To Answer (03/25/2023)   Social Connection and Isolation Panel [NHANES]    Frequency of Communication with Friends and Family: Patient unable to answer    Frequency of Social Gatherings with Friends and Family: Patient unable to answer    Attends Religious Services: Patient unable to answer    Active Member of Clubs or Organizations: Patient unable to answer    Attends Banker Meetings: Patient unable to answer    Marital Status: Patient unable to answer    Current Medications: Current Facility-Administered Medications  Medication Dose Route Frequency Provider Last Rate Last Admin   acetaminophen (TYLENOL) tablet 650 mg  650 mg Oral Q6H PRN Motley-Mangrum, Jadeka A, PMHNP   650 mg at 09/30/23 0925   alum & mag hydroxide-simeth (MAALOX/MYLANTA) 200-200-20 MG/5ML suspension 30 mL  30 mL Oral Q4H PRN Motley-Mangrum, Jadeka A, PMHNP       haloperidol (HALDOL) tablet 5 mg  5 mg Oral TID PRN Motley-Mangrum, Jadeka A, PMHNP       And   diphenhydrAMINE (BENADRYL) capsule 50 mg  50 mg Oral TID PRN Motley-Mangrum, Jadeka A, PMHNP       haloperidol lactate (HALDOL) injection 5 mg  5 mg Intramuscular TID PRN Motley-Mangrum, Jadeka A, PMHNP       And   diphenhydrAMINE (BENADRYL) injection 50 mg  50 mg Intramuscular TID PRN Motley-Mangrum, Jadeka A, PMHNP       And   LORazepam (ATIVAN) injection 2 mg  2 mg Intramuscular TID PRN Motley-Mangrum, Jadeka A, PMHNP       haloperidol lactate (HALDOL) injection 10 mg  10 mg Intramuscular TID PRN Motley-Mangrum, Jadeka A, PMHNP       And    diphenhydrAMINE (BENADRYL) injection 50 mg  50 mg Intramuscular TID PRN Motley-Mangrum, Jadeka A, PMHNP       And   LORazepam (ATIVAN) injection 2 mg  2 mg Intramuscular TID PRN Motley-Mangrum, Jadeka A, PMHNP       hydrOXYzine (ATARAX) tablet 10 mg  10 mg Oral Q6H PRN Izediuno, Delight Ovens, MD       magnesium hydroxide (MILK OF MAGNESIA) suspension 30 mL  30 mL Oral Daily PRN Motley-Mangrum, Jadeka A, PMHNP       risperiDONE (RISPERDAL) tablet 1 mg  1 mg Oral QHS Izediuno, Vincent A, MD   1 mg at 10/02/23 2103   sertraline (ZOLOFT) tablet 150 mg  150 mg Oral Daily Peterson Ao, MD   150 mg  at 10/03/23 0804   solifenacin (VESICARE) tablet 10 mg  10 mg Oral Daily Izediuno, Delight Ovens, MD   10 mg at 10/03/23 0805   traZODone (DESYREL) tablet 25 mg  25 mg Oral QHS PRN Georgiann Cocker, MD   25 mg at 10/02/23 2103    Lab Results:  No results found for this or any previous visit (from the past 48 hours).   Blood Alcohol level:  Lab Results  Component Value Date   ETH <10 09/29/2023    Metabolic Disorder Labs: Lab Results  Component Value Date   HGBA1C 5.9 (H) 07/07/2023   No results found for: "PROLACTIN" Lab Results  Component Value Date   CHOL 247 (H) 08/12/2023   TRIG 135 08/12/2023   HDL 54 08/12/2023   CHOLHDL 4.6 (H) 08/12/2023   VLDL 18 03/20/2016   LDLCALC 169 (H) 08/12/2023   LDLCALC 147 (H) 07/07/2023    Physical Findings: AIMS:  , ,  ,  ,    CIWA:    COWS:     Musculoskeletal: Strength & Muscle Tone: within normal limits Gait & Station: normal Patient leans: N/A  Psychiatric Specialty Exam:  Presentation  General Appearance: Well-groomed, not in any distress, out in the milieu prior to interview.  No EPS.  Eye Contact: Good eye contact.  Speech: Spontaneous, normalizing rate, tone and volume.  Mood and Affect  Mood: Subjectively and objectively better.  Affect: Full range and mood congruent.  Thought Process  Thought Processes: Normal  speed of thought.  Linear and goal directed.  Orientation: Oriented to place person and time.  Thought Content: Persecutory delusions are taking the back burner.  No current suicidal thoughts.  No thoughts of violence.  No homicidal thoughts.  No negative rumination.  No guilty rumination.  No obsessions.  Hallucinations: No hallucination in any modality.  Sensorium  Memory: Good.  Judgment: Good.  Insight: Good.  Executive Functions  Concentration: Good.  Attention Span: Good.  Recall: Good.  Fund of Knowledge: Good.  Language: Normal.  Psychomotor Activity  Normal psychomotor activity.  Physical Exam: Physical Exam ROS Blood pressure 119/81, pulse 72, temperature 98.5 F (36.9 C), temperature source Oral, resp. rate 16, height 5\' 6"  (1.676 m), weight 87.1 kg, SpO2 100%. Body mass index is 30.99 kg/m.   Treatment Plan Summary: Patient presented with new onset psychosis associated with auditory hallucinations and persecutory delusions.  Thyroid function test indicates hyperthyroidism.  She has responded well to low-dose risperidone.  We optimized her antidepressant from 100 mg to 150 mg.  She seems to be responding appropriately.  No current dangerousness.  Psychosis seems to be resolving.  We will maintain her current regimen.  She will need endocrinology follow-up.  1.  Continue risperidone 1 mg at bedtime. 2.  Continue sertraline 150 mg daily. 3.  Decrease as needed hydroxyzine to 10 mg as needed. 4.  Encourage unit groups and therapeutic activities. 5.  Continue to monitor mood behavior and interaction with others. 6.  Social worker will obtain collateral from her family. 7.  Social worker will coordinate discharge and aftercare planning.   Golda Acre, MD 10/03/2023, 12:46 PM

## 2023-10-03 NOTE — [facility] Group Notes (Signed)
 Spirituality Group  Goal: This group is driven by participant needs and themes that emerge from current concerns.  Chaplain offers ground rules and basic framework for engagement, naming the ways that emotions, sense of self and values, grief, and relationship among other concerns all comprise spiritual life.  Theoretical basis: Using the group therapy frameworks of Chyrl Civatte as well as principles in Relational Cultural Therapy as well as Rogerian approaches, participants are invited to explore spiritual needs and to respond in ways that foster mutual empathy and relational support.  Observations: Maria Blackburn was an active participant in the group discussion.  Maria Blackburn, M.Div [phone]

## 2023-10-04 DIAGNOSIS — F23 Brief psychotic disorder: Secondary | ICD-10-CM | POA: Diagnosis not present

## 2023-10-04 MED ORDER — SERTRALINE HCL 100 MG PO TABS: 150.0000 mg | ORAL_TABLET | Freq: Every day | ORAL | 0 refills | Status: DC

## 2023-10-04 MED ORDER — ATORVASTATIN CALCIUM 20 MG PO TABS: 20.0000 mg | ORAL_TABLET | Freq: Every day | ORAL | 0 refills | Status: AC

## 2023-10-04 MED ORDER — RISPERIDONE 1 MG PO TABS: 1.0000 mg | ORAL_TABLET | Freq: Every day | ORAL | 0 refills | Status: DC

## 2023-10-04 NOTE — [facility] Suicide Risk Assessment (Addendum)
 [facility] INPATIENT:  Family/Significant Other Suicide Prevention Education  Suicide Prevention Education:  Education Completed; Tamrah Victorino - husband, has been identified by the patient as the family member/significant other with whom the patient will be residing, and identified as the person(s) who will aid the patient in the event of a mental health crisis (suicidal ideations/suicide attempt).  With written consent from the patient, the family member/significant other has been provided the following suicide prevention education, prior to the and/or following the discharge of the patient.  CSW spoke with Pt's spouse in lobby F2F, prior to discharge. He confirms that there are no weapons in the home. Provide a brief synopsis of hospital course and discharge recommendations.   The suicide prevention education provided includes the following: Suicide risk factors Suicide prevention and interventions National Suicide Hotline telephone number [facility] assessment telephone number [facility] Emergency Assistance 911 [facility] and/or Residential Mobile Crisis Unit telephone number  Request made of family/significant other to: Remove weapons (e.g., guns, rifles, knives), all items previously/currently identified as safety concern.   Remove drugs/medications (over-the-counter, prescriptions, illicit drugs), all items previously/currently identified as a safety concern.  The family member/significant other verbalizes understanding of the suicide prevention education information provided.  The family member/significant other agrees to remove the items of safety concern listed above.  Joelyn Oms Chequita Mofield, LCSW 10/04/2023, 1:46 PM

## 2023-10-04 NOTE — Progress Notes (Signed)
 [facility] Adult Case Management Discharge Plan :  Will you be returning to the same living situation after discharge:  Yes,  home w/ family At discharge, do you have transportation home?: Yes,  husband to provide Do you have the ability to pay for your medications: Yes,  active insurance benefits  Release of information consent forms completed and in the chart;  Patient's signature needed at discharge.  Patient to Follow up at:  Follow-up Information     [facility]. Go to.   Specialty: Behavioral Health Why: Please go to this provider for medication management services, on Monday through Friday, arrive by 7:00 am for an assessment. Contact information: [address] [place] [address] [phone]        [facility]. Go to.   Specialty: Professional Counselor Why: Please go to this provider for therapy services, on Monday through Friday, from 9 am to 1 pm. Contact information: [address] [place] [address] [phone]                 Next level of care provider has access to [facility] Link:yes  Safety Planning and Suicide Prevention discussed: Yes,  attempts made with family, SPE completed with Patient  Has patient been referred to the Quitline?: Patient does not use tobacco/nicotine products  Patient has been referred for addiction treatment: No known substance use disorder.  Joelyn Oms Aby Gessel, LCSW 10/04/2023, 1:33 PM

## 2023-10-04 NOTE — Group Note (Signed)
 Recreation Therapy Group Note   Group Topic:Animal Assisted Therapy   Group Date: 10/04/2023 Start Time: 0950 End Time: 1030 Facilitators: Lakiesha Ralphs-McCall, LRT,CTRS Location: [address] Dayroom   Animal-Assisted Activity (AAA) Program Checklist/Progress Notes Patient Eligibility Criteria Checklist & Daily Group note for Rec Tx Intervention  AAA/T Program Assumption of Risk Form signed by Patient/ or Parent Legal Guardian Yes  Patient is free of allergies or severe asthma Yes  Patient reports no fear of animals Yes  Patient reports no history of cruelty to animals Yes  Patient understands his/her participation is voluntary Yes  Patient washes hands before animal contact Yes  Patient washes hands after animal contact Yes  Education: Hand Washing, Appropriate Animal Interaction   Education Outcome: Acknowledges education.    Affect/Mood: Appropriate   Participation Level: Engaged   Participation Quality: Independent   Behavior: Appropriate   Speech/Thought Process: Focused   Insight: Good   Judgement: Good   Modes of Intervention: Teaching laboratory technician   Patient Response to Interventions:  Engaged   Education Outcome:  In group clarification offered    Clinical Observations/Individualized Feedback: Patient attended session and interacted appropriately with therapy dog and peers. Patient asked appropriate questions about therapy dog and his training. Patient shared stories about their pets at home with group.      Plan: Continue to engage patient in RT group sessions 2-3x/week.   Anaja Monts-McCall, LRT,CTRS 10/04/2023 1:21 PM

## 2023-10-04 NOTE — Plan of Care (Signed)
  Problem: Education: Goal: Knowledge of [facility] Education information/materials will improve Outcome: Completed/Met Goal: Emotional status will improve 10/04/2023 1154 by Thersa Salt, RN Outcome: Completed/Met 10/04/2023 0912 by Thersa Salt, RN Outcome: Adequate for Discharge Goal: Mental status will improve 10/04/2023 1154 by Thersa Salt, RN Outcome: Completed/Met 10/04/2023 0912 by Thersa Salt, RN Outcome: Adequate for Discharge Goal: Verbalization of understanding the information provided will improve 10/04/2023 1154 by Thersa Salt, RN Outcome: Completed/Met 10/04/2023 0912 by Thersa Salt, RN Outcome: Adequate for Discharge   Problem: Activity: Goal: Interest or engagement in activities will improve Outcome: Completed/Met Goal: Sleeping patterns will improve Outcome: Completed/Met   Problem: Coping: Goal: Ability to verbalize frustrations and anger appropriately will improve Outcome: Completed/Met Goal: Ability to demonstrate self-control will improve Outcome: Completed/Met

## 2023-10-04 NOTE — Plan of Care (Signed)
   Problem: Education: Goal: Knowledge of [facility] Education information/materials will improve Outcome: Progressing   Problem: Activity: Goal: Interest or engagement in activities will improve Outcome: Progressing   Problem: Coping: Goal: Ability to verbalize frustrations and anger appropriately will improve Outcome: Progressing
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
## 2023-10-04 NOTE — Progress Notes (Signed)
   10/04/23 0800  Psych Admission Type (Psych Patients Only)  Admission Status Voluntary  Psychosocial Assessment  Patient Complaints None  Eye Contact Fair  Facial Expression Animated  Affect Appropriate to circumstance  Speech Logical/coherent  Interaction Assertive  Motor Activity Slow  Appearance/Hygiene Unremarkable  Behavior Characteristics Cooperative;Appropriate to situation  Mood Pleasant  Thought Process  Coherency WDL  Content WDL  Delusions None reported or observed  Perception WDL  Hallucination None reported or observed  Judgment Poor  Confusion None  Danger to Self  Current suicidal ideation? Denies  Agreement Not to Harm Self Yes  Description of Agreement verbal  Danger to Others  Danger to Others None reported or observed

## 2023-10-04 NOTE — Group Note (Unsigned)
 Date:  10/04/2023 Time:  9:49 AM  Group Topic/Focus:  Emotional Education:   The focus of this group is to discuss what feelings/emotions are, and how they are experienced. Goals Group:   The focus of this group is to help patients establish daily goals to achieve during treatment and discuss how the patient can incorporate goal setting into their daily lives to aide in recovery.     Participation Level:  {B[facility] PARTICIPATION VOJJK:09381}  Participation Quality:  {BHH PARTICIPATION QUALITY:22265}  Affect:  {BHH AFFECT:22266}  Cognitive:  {BHH COGNITIVE:22267}  Insight: {BHH Insight2:20797}  Engagement in Group:  {BHH ENGAGEMENT IN WEXHB:71696}  Modes of Intervention:  {BHH MODES OF INTERVENTION:22269}  Additional Comments:  ***  Gardiner Barefoot 10/04/2023, 9:49 AM

## 2023-10-04 NOTE — BHH Suicide Risk Assessment (Signed)
 [facility] INPATIENT:  Patient Suicide Prevention Education  Suicide Prevention Education:  Education Completed;   Suicide Prevention Education was reviewed thoroughly with patient, including risk factors, warning signs, and what to do. Mobile Crisis services were described and that telephone number pointed out, with encouragement to patient to put this number in personal cell phone. Brochure was provided to patient to share with natural supports. Patient acknowledged the ways in which they are at risk, and how working through each of their issues can gradually start to reduce their risk factors. Patient was encouraged to think of the information in the context of people in their own lives. Patient denied having access to firearms Patient verbalized understanding of information provided. Patient endorsed a desire to live.    The suicide prevention education provided includes the following: Suicide risk factors Suicide prevention and interventions National Suicide Hotline telephone number [facility] assessment telephone number [facility] Emergency Assistance 911 [facility] and/or Residential Mobile Crisis Unit telephone number   Joelyn Oms [place], [place] 10/04/2023, 1:30 PM

## 2023-10-04 NOTE — BHH Suicide Risk Assessment (Signed)
 [facility] INPATIENT:  Family/Significant Other Suicide Prevention Education  Suicide Prevention Education:  Contact Attempts: Mayleigh Tetrault, husband, [phone] / Elisandra Deshmukh, son, [phone], has been identified by the patient as the family member/significant other with whom the patient will be residing, and identified as the person(s) who will aid the patient in the event of a mental health crisis.  With written consent from the patient, three attempts were made to provide suicide prevention education, prior to and/or following the patient's discharge.  We were unsuccessful in providing suicide prevention education.  A suicide education pamphlet was given to the patient to share with family/significant other.  Date and time of first attempt:10/04/23 /1128  Date and time of second attempt:10/04/23 /1136 Date and time of third attempt:10/04/23 /1329   Jacinta Shoe, LCSW 10/04/2023, 1:25 PM

## 2023-10-04 NOTE — Progress Notes (Signed)
 Patient verbalizes readiness for discharge. All patient belongings returned to patient. Discharge instructions read and discussed with patient (appointments, medications, resources). Patient expressed gratitude for care provided. Patient discharged to lobby at 1345 where her husband was waiting.

## 2023-10-04 NOTE — [facility] Suicide Risk Assessment (Signed)
 [facility] Discharge Suicide Risk Assessment   Principal Problem: Acute psychosis (HCC)  Discharge Diagnoses: Principal Problem:   Acute psychosis (HCC) Active Problems:   MDD (major depressive disorder)      Total Time spent with patient: 40  Musculoskeletal: Strength & Muscle Tone: within normal limits Gait & Station: normal Patient leans: N/A   Psychiatric Specialty Exam:  Presentation  General Appearance: Appropriate for Environment  Eye Contact: Good  Speech: Clear and Coherent; Normal Rate  Speech Volume: Normal  Handedness: Right   Mood and Affect  Mood: Euthymic  Affect: Congruent   Thought Process  Thought Processes: Linear  Descriptions of Associations: Intact  Orientation: Full (Time, Place and Person)  Thought Content: Logical  History of Schizophrenia/Schizoaffective disorder: No data recorded Duration of Psychotic Symptoms: NA Hallucinations: Hallucinations: None  Ideas of Reference: None  Suicidal Thoughts: Suicidal Thoughts: No  Homicidal Thoughts: Homicidal Thoughts: No   Sensorium  Memory: Immediate Good  Judgment: Fair  Insight: Fair   Art therapist  Concentration: Good  Attention Span: Good  Recall: Good  Fund of Knowledge: Good  Language: Good   Psychomotor Activity  Psychomotor Activity: Psychomotor Activity: Normal   Assets  Assets: Communication Skills; Social Support; Housing; Leisure Time   Sleep  Sleep: Sleep: Good   Physical Exam: General: Sitting comfortably. NAD. HEENT: Normocephalic, atraumatic, MMM, EMOI Lungs: no increased work of breathing noted Heart: no cyanosis Abdomen: Non distended Musculoskeletal: FROM. No obvious deformities Skin: Warm, dry, intact. No rashes noted Neuro: No obvious focal deficits.  Gait and station are normal  Review of Systems  Constitutional: Negative.   HENT: Negative.    Eyes: Negative.   Respiratory: Negative.    Cardiovascular: Negative.    Gastrointestinal: Negative.   Genitourinary: Negative.   Skin: Negative.   Neurological: Negative.   Psychiatric/Behavioral:  Negative   Mental Status Per Nursing Assessment: NA  Demographic Factors:  Caucasian, Low socioeconomic status, and Unemployed  Loss Factors: Decrease in vocational status  Historical Factors: NA  Risk Reduction Factors:   Responsible for children under 39 years of age, Sense of responsibility to family, Living with another person, especially a relative, and Positive social support  Continued Clinical Symptoms:  Previous psychiatric diagnoses and treatments  Cognitive Features That Contribute To Risk:  None  Suicide Risk:  Minimal: No identifiable suicidal ideation.  Patients presenting with no risk factors but with morbid ruminations; may be classified as minimal risk based on the severity of the depressive symptoms.    Follow-up Information     [facility]. Go to.   Specialty: Behavioral Health Why: Please go to this provider for medication management services, on Monday through Friday, arrive by 7:00 am for an assessment. Contact information: [address] [place] [address] [phone]        [facility] Go to.   Specialty: Professional Counselor Why: Please go to this provider for therapy services, on Monday through Friday, from 9 am to 1 pm. Contact information: [address] [place] [address] [phone]                  Plan Of Care/Follow-up recommendations:  Activity: as tolerated  Diet: heart healthy  Other: -Follow-up with your outpatient psychiatric provider -instructions on appointment date, time, and address (location) are provided to you in discharge paperwork.  -Take your psychiatric medications as prescribed at discharge - instructions are provided to you in the discharge paperwork  -  Follow-up with outpatient primary care  doctor and other specialists -for management of preventative medicine and chronic medical issues  -Testing: Follow-up with outpatient provider for abnormal lab results: NA  -If you are prescribed an atypical antipsychotic medication, we recommend that your outpatient psychiatrist follow routine screening for side effects within 3 months of discharge, including monitoring: AIMS scale, height, weight, blood pressure, fasting lipid panel, HbA1c, and fasting blood sugar.   -Recommend total abstinence from alcohol, tobacco, and other illicit drug use at discharge.   -If your psychiatric symptoms recur, worsen, or if you have side effects to your psychiatric medications, call your outpatient psychiatric provider, 911, [phone] or go to the nearest emergency department.  -If suicidal thoughts occur, immediately call your outpatient psychiatric provider, 911, [phone] or go to the nearest emergency department.   Criss Alvine, MD 10/04/23 11:15 AM

## 2023-10-04 NOTE — Group Note (Signed)
 Date:  10/04/2023 Time:  11:37 AM  Group Topic/Focus:  Goals Group:   The focus of this group is to help patients establish daily goals to achieve during treatment and discuss how the patient can incorporate goal setting into their daily lives to aide in recovery. Orientation:   The focus of this group is to educate the patient on the purpose and policies of crisis stabilization and provide a format to answer questions about their admission.  The group details unit policies and expectations of patients while admitted.    Participation Level:  Active  Participation Quality:  Appropriate  Affect:  Appropriate  Cognitive:  Appropriate  Insight: Appropriate  Engagement in Group:  Engaged  Modes of Intervention:  Activity and Orientation  Additional Comments:   Pt attended and actively participated in the Orientation and Goals group.  Edmund Hilda Rhilee Currin 10/04/2023, 11:37 AM

## 2023-10-04 NOTE — Plan of Care (Signed)
  Problem: Education: Goal: Knowledge of [facility] Education information/materials will improve Outcome: Completed/Met Goal: Emotional status will improve Outcome: Adequate for Discharge Goal: Mental status will improve Outcome: Adequate for Discharge Goal: Verbalization of understanding the information provided will improve Outcome: Adequate for Discharge

## 2023-10-05 ENCOUNTER — Emergency Department (HOSPITAL_COMMUNITY)

## 2023-10-05 ENCOUNTER — Emergency Department (HOSPITAL_COMMUNITY)
Admission: EM | Admit: 2023-10-05 | Discharge: 2023-10-05 | Disposition: A | Discharge status: Home / Self Care | Attending: Emergency Medicine | Admitting: Emergency Medicine

## 2023-10-05 ENCOUNTER — Ambulatory Visit (HOSPITAL_COMMUNITY)
Admission: EM | Admit: 2023-10-05 | Discharge: 2023-10-05 | Disposition: A | Discharge status: Home / Self Care | Source: Home / Self Care | Attending: Psychiatry | Admitting: Psychiatry

## 2023-10-05 ENCOUNTER — Other Ambulatory Visit: Payer: Self-pay

## 2023-10-05 DIAGNOSIS — R4182 Altered mental status, unspecified: Secondary | ICD-10-CM | POA: Diagnosis not present

## 2023-10-05 DIAGNOSIS — R479 Unspecified speech disturbances: Secondary | ICD-10-CM | POA: Diagnosis not present

## 2023-10-05 DIAGNOSIS — M7501 Adhesive capsulitis of right shoulder: Secondary | ICD-10-CM | POA: Insufficient documentation

## 2023-10-05 DIAGNOSIS — Z79899 Other long term (current) drug therapy: Secondary | ICD-10-CM | POA: Insufficient documentation

## 2023-10-05 DIAGNOSIS — T434X5A Adverse effect of butyrophenone and thiothixene neuroleptics, initial encounter: Secondary | ICD-10-CM | POA: Diagnosis not present

## 2023-10-05 DIAGNOSIS — R42 Dizziness and giddiness: Secondary | ICD-10-CM | POA: Insufficient documentation

## 2023-10-05 DIAGNOSIS — F411 Generalized anxiety disorder: Secondary | ICD-10-CM | POA: Insufficient documentation

## 2023-10-05 DIAGNOSIS — T50905A Adverse effect of unspecified drugs, medicaments and biological substances, initial encounter: Secondary | ICD-10-CM

## 2023-10-05 DIAGNOSIS — F22 Delusional disorders: Secondary | ICD-10-CM | POA: Insufficient documentation

## 2023-10-05 DIAGNOSIS — Q381 Ankyloglossia: Secondary | ICD-10-CM | POA: Diagnosis not present

## 2023-10-05 DIAGNOSIS — F410 Panic disorder [episodic paroxysmal anxiety] without agoraphobia: Secondary | ICD-10-CM | POA: Insufficient documentation

## 2023-10-05 DIAGNOSIS — R4781 Slurred speech: Secondary | ICD-10-CM | POA: Diagnosis not present

## 2023-10-05 DIAGNOSIS — E039 Hypothyroidism, unspecified: Secondary | ICD-10-CM | POA: Insufficient documentation

## 2023-10-05 DIAGNOSIS — T43595A Adverse effect of other antipsychotics and neuroleptics, initial encounter: Secondary | ICD-10-CM | POA: Insufficient documentation

## 2023-10-05 LAB — COMPREHENSIVE METABOLIC PANEL WITH GFR
ALT: 25 U/L (ref 0–44)
AST: 18 U/L (ref 15–41)
Albumin: 4.1 g/dL (ref 3.5–5.0)
Alkaline Phosphatase: 42 U/L (ref 38–126)
Anion gap: 8 (ref 5–15)
BUN: 11 mg/dL (ref 6–20)
CO2: 24 mmol/L (ref 22–32)
Calcium: 9 mg/dL (ref 8.9–10.3)
Chloride: 108 mmol/L (ref 98–111)
Creatinine, Ser: 0.67 mg/dL (ref 0.44–1.00)
GFR, Estimated: >60 mL/min (ref >60)
Glucose, Bld: 135 mg/dL — ABNORMAL HIGH (ref 70–99)
Potassium: 3.3 mmol/L — ABNORMAL LOW (ref 3.5–5.1)
Sodium: 140 mmol/L (ref 135–145)
Total Bilirubin: 1.5 mg/dL — ABNORMAL HIGH (ref 0.0–1.2)
Total Protein: 6.8 g/dL (ref 6.5–8.1)

## 2023-10-05 LAB — CK: Total CK: 81 U/L (ref 38–234)

## 2023-10-05 LAB — URINALYSIS, ROUTINE W REFLEX MICROSCOPIC
Bilirubin Urine: NEGATIVE
Glucose, UA: NEGATIVE mg/dL
Hgb urine dipstick: NEGATIVE
Ketones, ur: NEGATIVE mg/dL
Nitrite: NEGATIVE
Protein, ur: NEGATIVE mg/dL
Specific Gravity, Urine: 1.006 (ref 1.005–1.030)
pH: 9 — ABNORMAL HIGH (ref 5.0–8.0)

## 2023-10-05 LAB — CBC WITH DIFFERENTIAL/PLATELET
Abs Immature Granulocytes: 0.04 10*3/uL (ref 0.00–0.07)
Basophils Absolute: 0.1 10*3/uL (ref 0.0–0.1)
Basophils Relative: 1 %
Eosinophils Absolute: 0.1 10*3/uL (ref 0.0–0.5)
Eosinophils Relative: 1 %
HCT: 41.7 % (ref 36.0–46.0)
Hemoglobin: 14 g/dL (ref 12.0–15.0)
Immature Granulocytes: 0 %
Lymphocytes Relative: 19 %
Lymphs Abs: 1.9 10*3/uL (ref 0.7–4.0)
MCH: 29.4 pg (ref 26.0–34.0)
MCHC: 33.6 g/dL (ref 30.0–36.0)
MCV: 87.6 fL (ref 80.0–100.0)
Monocytes Absolute: 0.7 10*3/uL (ref 0.1–1.0)
Monocytes Relative: 7 %
Neutro Abs: 7.2 10*3/uL (ref 1.7–7.7)
Neutrophils Relative %: 72 %
Platelets: 279 10*3/uL (ref 150–400)
RBC: 4.76 MIL/uL (ref 3.87–5.11)
RDW: 13.1 % (ref 11.5–15.5)
WBC: 10 10*3/uL (ref 4.0–10.5)
nRBC: 0 % (ref 0.0–0.2)

## 2023-10-05 LAB — RAPID URINE DRUG SCREEN, HOSP PERFORMED
Amphetamines: NOT DETECTED
Barbiturates: NOT DETECTED
Benzodiazepines: NOT DETECTED
Cocaine: NOT DETECTED
Opiates: NOT DETECTED
Tetrahydrocannabinol: NOT DETECTED

## 2023-10-05 LAB — PHOSPHORUS: Phosphorus: 1.2 mg/dL — ABNORMAL LOW (ref 2.5–4.6)

## 2023-10-05 LAB — T4, FREE: Free T4: 1.12 ng/dL (ref 0.61–1.12)

## 2023-10-05 LAB — MAGNESIUM: Magnesium: 2.4 mg/dL (ref 1.7–2.4)

## 2023-10-05 LAB — ACETAMINOPHEN LEVEL: Acetaminophen (Tylenol), Serum: <10 ug/mL — ABNORMAL LOW (ref 10–30)

## 2023-10-05 LAB — TSH: TSH: 0.964 u[IU]/mL (ref 0.350–4.500)

## 2023-10-05 LAB — SALICYLATE LEVEL: Salicylate Lvl: <7 mg/dL — ABNORMAL LOW (ref 7.0–30.0)

## 2023-10-05 MED ORDER — SODIUM CHLORIDE 0.9 % IV BOLUS
1000.0000 mL | Freq: Once | INTRAVENOUS | Status: AC
  Administered 2023-10-05: 1000 mL via INTRAVENOUS

## 2023-10-05 MED ORDER — POTASSIUM PHOSPHATES 15 MMOLE/5ML IV SOLN
45.0000 mmol | INTRAVENOUS | Status: AC
  Administered 2023-10-05: 45 mmol via INTRAVENOUS
  Filled 2023-10-05: qty 15

## 2023-10-05 MED ORDER — PHOS-NAK 280-160-250 MG PO PACK: 1.0000 | PACK | Freq: Three times a day (TID) | ORAL | 0 refills | Status: DC

## 2023-10-05 MED ORDER — POTASSIUM & SODIUM PHOSPHATES 280-160-250 MG PO PACK
2.0000 | PACK | ORAL | Status: DC
  Administered 2023-10-05: 2 via ORAL
  Filled 2023-10-05 (×2): qty 2

## 2023-10-05 MED ORDER — K PHOS MONO-SOD PHOS DI & MONO 155-852-130 MG PO TABS
500.0000 mg | ORAL_TABLET | Freq: Once | ORAL | Status: DC
  Filled 2023-10-05: qty 2

## 2023-10-05 NOTE — ED Notes (Addendum)
 Provider asked to report to patient bedside due to her having difficulty with her speech. Her airway is not obstructed but difficult for her to speak. Provider reported Nehemiah Settle will be to patient beside shortly.

## 2023-10-05 NOTE — Progress Notes (Signed)
   10/05/23 1319  [facility] Triage Screening (Walk-ins at [facility] only)  How Did You Hear About [place]? Hospital Discharge  What Is the Reason for Your Visit/Call Today? Patient was just discharged from [facility] yesterday.  She apparently had a medication reaction last night and went to [facility] ED and was referred here.  Patient states that she does not know if she had a medication reaction or if she had a panic attack lst night, but states that she feels like her body "just froze up" and she states that she was breathing heavily.  Patient states that she has never heard voices until 12 days ago and states that it just happened "out of the [facility]."  Patient states that she is under a lot of stress.  She has custody of her niece's child because the child was taken from them.  She states that the voices that she hears are from her niece and her niece's boyfriend.  She states that they tell her they see her, that they are under the house watching her, they want to kill her, call her a "fucking bitch and a piece of shit".  She states that the voices tell her that they want her to become a child molester so she will lose custody of the child.  Patient denies SI/HI and denies any SA issues.  Patient states that she has increased anxiety and not sleeping well or eating much.  Current medications: Hydroxyzine, Risperdal, Sertraline, Vesicare and Atorvastatin.  Patient is urbent based on active hallucinations.  How Long Has This Been Causing You Problems? 1 wk - 1 month  Have You Recently Had Any Thoughts About Hurting Yourself? No  Are You Planning to Commit Suicide/Harm Yourself At This time? No  Have you Recently Had Thoughts About Hurting Someone Karolee Ohs? No  Are You Planning To Harm Someone At This Time? No  Physical Abuse Denies  Verbal Abuse Denies  Sexual Abuse Denies  Exploitation of patient/patient's resources Denies  Self-Neglect Denies  Possible abuse reported to:  (none reported)  Are you currently experiencing  any auditory, visual or other hallucinations? Yes  Please explain the hallucinations you are currently experiencing: see note  Have You Used Any Alcohol or Drugs in the Past 24 Hours? No  Do you have any current medical co-morbidities that require immediate attention? No  Clinician description of patient physical appearance/behavior: casually dressed, normal in appearance  What Do You Feel Would Help You the Most Today? Treatment for Depression or other mood problem;Medication(s)  If access to [facility] was not available, would you have sought care in the Emergency Department? Yes  Determination of Need Urgent (48 hours)  Options For Referral Medication Management;BH Urgent Care  Determination of Need filed? Yes

## 2023-10-05 NOTE — Discharge Instructions (Addendum)
 Is unclear, whether that your symptoms were from an medication reaction, due to your Risperdal, or from an anxiety attack.  I recommend that you stop your Risperdal, currently, and follow-up with [facility], for further evaluation.  Return to the ER if you feel like your symptoms are worsening.  You also have very low phosphorus, I have prescribed you some phosphorus supplementation.  Is very important that you follow-up with your primary care doctor, in the next week, for repeat, lab draw.

## 2023-10-05 NOTE — ED Notes (Signed)
Patient discharged home by provider

## 2023-10-05 NOTE — ED Notes (Signed)
 Provider at bedside speaking with patient at this time

## 2023-10-05 NOTE — ED Notes (Signed)
 Patient denies pain and is resting comfortably.

## 2023-10-05 NOTE — ED Provider Notes (Signed)
 [facility] EMERGENCY DE[facility]MENT AT [facility] Provider Note   CSN: [id number] Arrival date & time: 10/05/23  9147     History  Chief Complaint  Patient presents with   Medication Reaction    Patient brought in from home for possible medication reaction. Per EMS patient was home trying to sleep and just kept getting stiffer and stiffer. Recently d/c'd from [facility] and put on Risperdal. Muscle rigidity , back neck and face pain. Patient unable to speak due to her tongue making it diffucult to speak. Patient had tongue rolling and lip smack with medic that resolved. Medic administered 50 mg benadryl hx panic attacks. 20g RFA    Maria Blackburn is a 53 y.o. female, history of anxiety, suicidal ideation, who recently got started on Haldol, about a week ago.  She states she has been on Haldol for about a week, and feels rigidity, and her extremities, back, and face.  She denies any accidental ingestions, states she has been taking her medications appropriately.  She denies any nausea, vomiting, diarrhea, fevers, or chills.  Home Medications Prior to Admission medications   Medication Sig Start Date End Date Taking? Authorizing Provider  potassium & sodium phosphates (PHOS-NAK) 280-160-250 MG PACK Take 1 packet by mouth 3 (three) times daily. 10/05/23  Yes Sherril Shipman L, PA  atorvastatin (LIPITOR) 20 MG tablet Take 1 tablet (20 mg total) by mouth daily. 10/04/23   Golda Acre, MD  hydrOXYzine (VISTARIL) 100 MG capsule Take 1 capsule (100 mg total) by mouth every 6 (six) hours as needed. 08/12/23   Rema Fendt, NP  risperiDONE (RISPERDAL) 1 MG tablet Take 1 tablet (1 mg total) by mouth at bedtime. 10/04/23   Golda Acre, MD  sertraline (ZOLOFT) 100 MG tablet Take 1.5 tablets (150 mg total) by mouth daily. 10/05/23   Golda Acre, MD  solifenacin (VESICARE) 10 MG tablet Take 1 tablet (10 mg total) by mouth daily. 05/31/23   Stoneking, Danford Bad., MD      Allergies    Patient has  no known allergies.    Review of Systems   Review of Systems  Constitutional:  Negative for diaphoresis and fever.    Physical Exam Updated Vital Signs BP 138/72 (BP Location: Right Arm)   Pulse 83   Temp 98.2 F (36.8 C) (Oral)   Resp 16   SpO2 97%  Physical Exam Vitals and nursing note reviewed.  Constitutional:      General: She is not in acute distress.    Appearance: She is well-developed.  HENT:     Head: Normocephalic and atraumatic.  Eyes:     Conjunctiva/sclera: Conjunctivae normal.  Cardiovascular:     Rate and Rhythm: Normal rate and regular rhythm.     Heart sounds: No murmur heard. Pulmonary:     Effort: Pulmonary effort is normal. No respiratory distress.     Breath sounds: Normal breath sounds.  Abdominal:     Palpations: Abdomen is soft.     Tenderness: There is no abdominal tenderness.  Musculoskeletal:        General: No swelling.     Cervical back: Neck supple.  Skin:    General: Skin is warm and dry.     Capillary Refill: Capillary refill takes less than 2 seconds.  Neurological:     Mental Status: She is alert.     Comments: Difficulty with speech, but when distracted, speech is more readily available.  Tongue is  midline, equal facial strength.  Increased muscle rigidity, but with distractibility, patient has full range of motion, and muscle rigidity lessons.  Psychiatric:        Mood and Affect: Mood normal.     ED Results / Procedures / Treatments   Labs (all labs ordered are listed, but only abnormal results are displayed) Labs Reviewed  COMPREHENSIVE METABOLIC PANEL WITH GFR - Abnormal; Notable for the following components:      Result Value   Potassium 3.3 (*)    Glucose, Bld 135 (*)    Total Bilirubin 1.5 (*)    All other components within normal limits  ACETAMINOPHEN LEVEL - Abnormal; Notable for the following components:   Acetaminophen (Tylenol), Serum <10 (*)    All other components within normal limits  SALICYLATE LEVEL -  Abnormal; Notable for the following components:   Salicylate Lvl <7.0 (*)    All other components within normal limits  URINALYSIS, ROUTINE W REFLEX MICROSCOPIC - Abnormal; Notable for the following components:   Color, Urine STRAW (*)    pH 9.0 (*)    Leukocytes,Ua TRACE (*)    Bacteria, UA RARE (*)    All other components within normal limits  PHOSPHORUS - Abnormal; Notable for the following components:   Phosphorus 1.2 (*)    All other components within normal limits  CBC WITH DIFFERENTIAL/PLATELET  MAGNESIUM  CK  RAPID URINE DRUG SCREEN, HOSP PERFORMED  TSH  T4, FREE    EKG None  Radiology DG Chest Portable 1 View Result Date: 10/05/2023 CLINICAL DATA:  Altered mental status.  Muscle rigidity. EXAM: PORTABLE CHEST 1 VIEW COMPARISON:  03/04/2018 FINDINGS: The lungs are clear without focal pneumonia, edema, pneumothorax or pleural effusion. Cardiopericardial silhouette is at upper limits of normal for size. No acute bony abnormality. Telemetry leads overlie the chest. IMPRESSION: No active disease. Electronically Signed   By: Kennith Center M.D.   On: 10/05/2023 07:10    Procedures Procedures    Medications Ordered in ED Medications  potassium & sodium phosphates (PHOS-NAK) 280-160-250 MG packet 2 packet (2 packets Oral Given 10/05/23 0931)  potassium PHOSPHATE 45 mmol in dextrose 5 % 500 mL infusion (45 mmol Intravenous New Bag/Given 10/05/23 0930)  sodium chloride 0.9 % bolus 1,000 mL (0 mLs Intravenous Stopped 10/05/23 7829)    ED Course/ Medical Decision Making/ A&P                                 Medical Decision Making Patient is a 53 year old female, who was recently admitted for psychosis, found to have a little bit of hypothyroidism, was started on Haldol about a week ago, for psychotic episode.  She is overall well-appearing, but has difficulty with speech, and muscle rigidity.  She denies any accidental or purposeful ingestions.  Has been taking her Haldol as  instructed.  She still states difficult to talk, because her tongue feels very tight.  She denies any fevers, chills, no overdosing on medications.  Is not have any nausea, vomiting, or diarrhea.  No fevers.  Is overall well-appearing.  We will obtain urinalysis, chest x-ray, to rule out any infectious causes, we will obtain TSH, T4, to see if there is any spikes, and her thyroid levels, since last admission, that could be causing the muscle rigidity.  Additionally will obtain EKG, sure no arrhythmias, causing the symptoms, and mag, electrolytes, and CK, to further evaluate.  Already given  50 mg of Benadryl, by EMS, will start on fluids  Amount and/or Complexity of Data Reviewed Labs: ordered.    Details: Electrolytes unremarkable except for hypophosphatemia, of 1.2..  TSH normalized, normal CK Radiology: ordered.    Details: Chest x-ray clear ECG/medicine tests:  Decision-making details documented in ED Course.    Details: Normal sinus rhythm Discussion of management or test interpretation with external provider(s): Discussed with patient, she is doing better, after the fluids, she states she is feeling much better, she is speaking freely, and interacting appropriately.  This may represent a dystonic reaction, versus panic attack.  Is unclear at this time, as she has recently restarted Risperdal, now she is having the symptoms.  They have resolved after Benadryl and fluids.  She has no hyperthermia, nausea, vomiting, or diarrhea which is reassuring.  And she has no altered mental status.  I discussed this with her husband at bedside.  She received IV phosphorus, while she was here, and I gave her p.o. phosphorus as well.  She was discharged home with phosphorus, instructed to follow-up with her primary care doctor, for lab redraw's.  Additionally she was instructed to stop her respite all, and follow-up [facility] for medication management.  Risk OTC drugs.   Final Clinical Impression(s) / ED  Diagnoses Final diagnoses:  Hypophosphatemia  Adverse effect of drug, initial encounter    Rx / DC Orders ED Discharge Orders          Ordered    potassium & sodium phosphates (PHOS-NAK) 280-160-250 MG PACK  3 times daily        10/05/23 0842              Giuliano Preece, Harley [place], PA 10/05/23 1107    Arby Barrette, MD 10/05/23 [phone]

## 2023-10-05 NOTE — Discharge Instructions (Addendum)
 Follow-up Information       [facility] Go to.   Specialty: Behavioral Health Why: Please go to this provider for medication management services, on Monday through Friday, arrive by 7:00 am for an assessment. Contact information: [address] [place] [address] [phone]            [facility] Go to.   Specialty: Professional Counselor Why: Please go to this provider for therapy services, on Monday through Friday, from 9 am to 1 pm. Contact information: [address] [place] [address] [phone]

## 2023-10-05 NOTE — ED Provider Notes (Incomplete)
 Behavioral Health Urgent Care Medical Screening Exam  Patient Name: Maria Blackburn MRN: [id number] Date of Evaluation: 10/05/23 Chief Complaint:  "I think I had a panic attack." Diagnosis:  Final diagnoses:  Anxiety state   [facility] Triage Screening:  Patient was just discharged from [facility] yesterday. She apparently had a medication reaction last night and went to [facility] ED and was referred here. Patient states that she does not know if she had a medication reaction or if she had a panic attack lst night, but states that she feels like her body "just froze up" and she states that she was breathing heavily. Patient states that she has never heard voices until 12 days ago and states that it just happened "out of the [facility]." Patient states that she is under a lot of stress. She has custody of her niece's child because the child was taken from them. She states that the voices that she hears are from her niece and her niece's boyfriend. She states that they tell her they see her, that they are under the house watching her, they want to kill her, call her a "fucking bitch and a piece of shit". She states that the voices tell her that they want her to become a child molester so she will lose custody of the child. Patient denies SI/HI and denies any SA issues. Patient states that she has increased anxiety and not sleeping well or eating much. Current medications: Hydroxyzine, Risperdal, Sertraline, Vesicare and Atorvastatin. Patient is urbent based on active hallucinations.   History of Present illness: Maria Blackburn is a 53 y.o. female, with a documented psychiatric history of major depressive disorder and generalized anxiety disorder.  She was recently admitted to the [facility] from 3/27 - 10/04/2023 for auditory hallucinations and paranoia.   Patient seen face-to-face by this provider and chart reviewed on 10/05/2023.  On evaluation today, the patient, accompanied by her husband Nelma Rothman  (with her consent), reports a recent series of events impacting her mental and physical health.  Reports she was discharged from [facility] on October 04, 2023, following a 6-day inpatient stay.  Reports early this morning, she visited the [facility] emergency department due to dizziness, muscle rigidity, and a possible medication reaction. She reported that she had recently started Risperdal last week. She reports auditory hallucinations with an onset approximately 13 days ago.  Reports hearing gunshots and accusatory voices stating, "I am under your house, come down here, your a child molester."  She attributes these voices to her niece and her niece's boyfriend, expressing fears that they intend to harm her and "first her interactions that would jeopardize her custody of her 41-year-old foster child, who is her niece's this child.  She reports paranoia and feels that individuals are surveilling her home, eavesdropping, and issuing threats.  She reports increased anxiety, frequent panic attacks, and feelings of being overwhelmed.  Reports difficulty sleeping and reduced appetite.  She identifies her stressors as caring for her 22-year-old foster child, whose birthday is approaching; managing new college semester; providing assistance for her 84 year old son with "level 3 autism," who recently suffered a fall and now requires help with activities of daily living; dealing with a stray cat that gave birth to kittens in her home; experiencing physical discomfort from a "right frozen shoulder," for which she states that she received a cortisone shot prior to her recent psychiatric admission, and is in need of a rotator cuff surgery.  Reports  this morning, she experienced dizziness around 3:30 AM, followed by muscle rigidity and heavy breathing.  She reports she is uncertain if the symptoms were due to a medication reaction or panic attack.  Her husband reported he observed seizure-like activity and  went stiffness. She reports 1 missed dose of medication postdischarge due to pharmacy delay; states the prescription is expected to be ready for pickup today around 4:30 PM.  She denies any substance abuse issues.  She reports feeling overwhelmed and under significant stress.  Denies suicidal ideation, self-harm urges, and homicidal ideation, though expresses anger towards her needs.  She denies visual hallucinations.  She denies access to firearm.  She expresses a strong desire to initiate therapy probably to address panic attacks and stressors.  States she plans to resume prescribed medications upon retrieval from the pharmacy this afternoon.  States she has an outpatient appointment scheduled for Friday, April 5.   The patient declines inpatient psychiatric admission at this time, expressing confidence in managing her symptoms with outpatient therapy and medication adherence and the support of her husband.  She is discharged home with recommendations to follow-up with aftercare services from her recent hospitalization specifically [facility] walk-in clinic medication management and [facility] [place] for therapy services.   The importance of medication adherence and engagement in therapy was emphasized. The patient and her husband verbalized understanding and commitment to the treatment plan.  Flowsheet Row ED from 10/05/2023 in [facility] Most recent reading at 10/05/2023  1:28 PM ED from 10/05/2023 in [facility] Emergency Department at [facility] Most recent reading at 10/05/2023  6:29 AM Ad[facility] (Discharged) [facility] Most recent reading at 09/30/2023 12:00 AM  C-SSRS RISK CATEGORY No Risk No Risk No Risk       Psychiatric Specialty Exam  Presentation  General Appearance:Appropriate for Environment  Eye Contact:Good  Speech:Clear and Coherent; Normal  Rate  Speech Volume:Normal  Handedness:Right   Mood and Affect  Mood: Euthymic  Affect: Congruent   Thought Process  Thought Processes: Linear  Descriptions of Associations:Intact  Orientation:Full (Time, Place and Person)  Thought Content:Logical    Hallucinations:None  Ideas of Reference:None  Suicidal Thoughts:No  Homicidal Thoughts:No   Sensorium  Memory: Immediate Good  Judgment: Fair  Insight: Fair   Art therapist  Concentration: Good  Attention Span: Good  Recall: Good  Fund of Knowledge: Good  Language: Good   Psychomotor Activity  Psychomotor Activity: Normal   Assets  Assets: Communication Skills; Social Support; Housing; Leisure Time   Sleep  Sleep: Good  Number of hours: No data recorded  Physical Exam: Physical Exam Vitals reviewed.  Constitutional:      General: She is not in acute distress.    Appearance: She is obese. She is not ill-appearing.  Cardiovascular:     Rate and Rhythm: Tachycardia present.     Comments: Heart Rate 106 Pulmonary:     Effort: No respiratory distress.  Neurological:     Mental Status: She is alert and oriented to person, place, and time.    Review of Systems  Constitutional:  Negative for chills, fever and weight loss.  Respiratory:  Negative for shortness of breath.   Cardiovascular:  Negative for chest pain and palpitations.  Gastrointestinal: Negative.   Musculoskeletal:  Negative for falls.  Skin: Negative.   Neurological:  Negative for dizziness, tingling, tremors and headaches.  Psychiatric/Behavioral:  Positive for  hallucinations. Negative for depression, memory loss, substance abuse and suicidal ideas. The patient is nervous/anxious.    Blood pressure (!) 139/90, pulse (!) 106, temperature 98.6 F (37 C), temperature source Oral, resp. rate 18, SpO2 98%. There is no height or weight on file to calculate BMI.  Musculoskeletal: Strength & Muscle Tone: within  normal limits Gait & Station: normal Patient leans: N/A   [facility] MSE Discharge Disposition for Follow up and Recommendations: Based on my evaluation the patient does not appear to have an emergency medical condition and can be discharged with resources and follow up care in outpatient services for Medication Management, Individual Therapy, and Group Therapy  Based on the information you provided and the presenting issue, outpatient services and resources have been recommended. You must follow through with treatment recommendations within 3-5 days from discharge to mitigate further risk to your safety and mental well-being. A list of referrals has been provided below to get you started. You are not limited to the list provided. In the case of an urgent crisis, contact the Mobile Crisis Unit with [facility] at [phone].    Follow-up Information       [facility] Go to.   Specialty: Behavioral Health Why: Please go to this provider for medication management services, on Monday through Friday, arrive by 7:00 am for an assessment. Contact information: [address] [place] [address] [phone]            [facility] Go to.   Specialty: Professional Counselor Why: Please go to this provider for therapy services, on Monday through Friday, from 9 am to 1 pm. Contact information: [address] [place] [address] [phone]     Norma Fredrickson, NP 10/05/2023, 11:46 PM

## 2023-10-12 DIAGNOSIS — F331 Major depressive disorder, recurrent, moderate: Secondary | ICD-10-CM | POA: Diagnosis not present

## 2023-10-15 DIAGNOSIS — Z419 Encounter for procedure for purposes other than remedying health state, unspecified: Secondary | ICD-10-CM | POA: Diagnosis not present

## 2023-10-18 ENCOUNTER — Ambulatory Visit: Payer: Self-pay

## 2023-10-18 ENCOUNTER — Telehealth: Payer: Self-pay | Admitting: Family

## 2023-10-18 ENCOUNTER — Ambulatory Visit: Admitting: Family Medicine

## 2023-10-18 NOTE — Telephone Encounter (Signed)
 Copied from CRM [id number]. Topic: Clinical - Red Word Triage >> Oct 18, 2023  1:32 PM Maria Blackburn wrote: [facility] that prompted transfer to Nurse Triage: depression, anxiety  Chief Complaint: anxiety, depression Symptoms: overwhelmed, anxious, feeling down, low energy, no drive, tense, overloaded, trouble sleeping Frequency: x couple months and worsening Pertinent Negatives: Patient denies no  Disposition: [] ED /[] Urgent Care (no appt availability in office) / [] Appointment(In office/virtual)/ [x]  [facility]/ [] Home Care/ [] Refused Recommended Disposition /[] [place] [facility]/ []  Follow-up with PCP Additional Notes: pt states she hears voices.  Recently stayed in behavior health facility for a couple of days though she was better: was released then started back hearing voices . Pt has the I "do not care attitude", "want to disappear or go away" or "lay in bed all day". Pt would like to be connected with therapist/counselor.  Reason for Disposition  Requesting to talk with a counselor (mental health worker, psychiatrist, etc.)  Answer Assessment - Initial Assessment Questions 1. CONCERN: "Did anything happen that prompted you to call today?"      anxiety 2. ANXIETY SYMPTOMS: "Can you describe how you (your loved one; patient) have been feeling?" (e.g., tense, restless, panicky, anxious, keyed up, overwhelmed, sense of impending doom).      Over load 3. ONSET: "How long have you been feeling this way?" (e.g., hours, days, weeks)     X 4 weeks 4. SEVERITY: "How would you rate the level of anxiety?" (e.g., 0 - 10; or mild, moderate, severe).     Moderate to severe 5. FUNCTIONAL IMPAIRMENT: "How have these feelings affected your ability to do daily activities?" "Have you had more difficulty than usual doing your normal daily activities?" (e.g., getting better, same, worse; self-care, school, work, interactions)     Affects daily life 6. HISTORY: "Have you felt this way before?"  "Have you ever been diagnosed with an anxiety problem in the past?" (e.g., generalized anxiety disorder, panic attacks, PTSD). If Yes, ask: "How was this problem treated?" (e.g., medicines, counseling, etc.)     *No Answer* 7. RISK OF HARM - SUICIDAL IDEATION: "Do you ever have thoughts of hurting or killing yourself?" If Yes, ask:  "Do you have these feelings now?" "Do you have a plan on how you would do this?"     No - just wants to disappear 8. TREATMENT:  "What has been done so far to treat this anxiety?" (e.g., medicines, relaxation strategies). "What has helped?"     Depression,  9. TREATMENT - THERAPIST: "Do you have a counselor or therapist? Name?"     No  10. POTENTIAL TRIGGERS: "Do you drink caffeinated beverages (e.g., coffee, colas, teas), and how much daily?" "Do you drink alcohol or use any drugs?" "Have you started any new medicines recently?"       N/a 11. PATIENT SUPPORT: "Who is with you now?" "Who do you live with?" "Do you have family or friends who you can talk to?"        Have some 12. OTHER SYMPTOMS: "Do you have any other symptoms?" (e.g., feeling depressed, trouble concentrating, trouble sleeping, trouble breathing, palpitations or fast heartbeat, chest pain, sweating, nausea, or diarrhea)       Depressed, trouble concentrating, trouble sleeping,  13. PREGNANCY: "Is there any chance you are pregnant?" "When was your last menstrual period?"       no  Answer Assessment - Initial Assessment Questions 1. CONCERN: "What happened that made you call today?"  depressed 2. DEPRESSION SYMPTOM SCREENING: "How are you feeling overall?" (e.g., decreased energy, increased sleeping or difficulty sleeping, difficulty concentrating, feelings of sadness, guilt, hopelessness, or worthlessness)     ecreased energy, low sleep, low energy 3. RISK OF HARM - SUICIDAL IDEATION:  "Do you ever have thoughts of hurting or killing yourself?"  (e.g., yes, no, no but preoccupation with thoughts  about death)   - INTENT:  "Do you have thoughts of hurting or killing yourself right NOW?" (e.g., yes, no, N/A)   - PLAN: "Do you have a specific plan for how you would do this?" (e.g., gun, knife, overdose, no plan, N/A)     no 4. RISK OF HARM - HOMICIDAL IDEATION:  "Do you ever have thoughts of hurting or killing someone else?"  (e.g., yes, no, no but preoccupation with thoughts about death)   - INTENT:  "Do you have thoughts of hurting or killing someone right NOW?" (e.g., yes, no, N/A)   - PLAN: "Do you have a specific plan for how you would do this?" (e.g., gun, knife, no plan, N/A)      no 5. FUNCTIONAL IMPAIRMENT: "How have things been going for you overall? Have you had more difficulty than usual doing your normal daily activities?"  (e.g., better, same, worse; self-care, school, work, interactions)     Daily life affected 6. SUPPORT: "Who is with you now?" "Who do you live with?" "Do you have family or friends who you can talk to?"      family 7. THERAPIST: "Do you have a counselor or therapist? Name?"     no 8. STRESSORS: "Has there been any new stress or recent changes in your life?"     no 9. ALCOHOL USE OR SUBSTANCE USE (DRUG USE): "Do you drink alcohol or use any illegal drugs?"     no 10. OTHER: "Do you have any other physical symptoms right now?" (e.g., fever)       no 11. PREGNANCY: "Is there any chance you are pregnant?" "When was your last menstrual period?"       no  Protocols used: Anxiety and Panic Attack-A-AH, Depression-A-AH

## 2023-10-18 NOTE — Telephone Encounter (Signed)
 Call to patient for follow-up unable to reach or leave message. Call was to verify if she has followed up with [facility] as instructed per hospital D/C

## 2023-10-18 NOTE — Telephone Encounter (Signed)
 Lvm for pt. She may be better suited at [facility]. 24/7 hotline provided.

## 2023-10-20 ENCOUNTER — Emergency Department (HOSPITAL_COMMUNITY)
Admission: EM | Admit: 2023-10-20 | Discharge: 2023-10-21 | Disposition: A | Discharge status: Other Acute Inpatient Hospital | Source: Home / Self Care | Attending: Emergency Medicine | Admitting: Emergency Medicine

## 2023-10-20 ENCOUNTER — Encounter (HOSPITAL_COMMUNITY): Payer: Self-pay

## 2023-10-20 ENCOUNTER — Other Ambulatory Visit: Payer: Self-pay

## 2023-10-20 DIAGNOSIS — R000 Tachycardia, unspecified: Secondary | ICD-10-CM | POA: Diagnosis not present

## 2023-10-20 DIAGNOSIS — R442 Other hallucinations: Secondary | ICD-10-CM | POA: Diagnosis not present

## 2023-10-20 DIAGNOSIS — T43501A Poisoning by unspecified antipsychotics and neuroleptics, accidental (unintentional), initial encounter: Secondary | ICD-10-CM | POA: Insufficient documentation

## 2023-10-20 DIAGNOSIS — F411 Generalized anxiety disorder: Secondary | ICD-10-CM | POA: Diagnosis present

## 2023-10-20 DIAGNOSIS — E876 Hypokalemia: Secondary | ICD-10-CM | POA: Insufficient documentation

## 2023-10-20 DIAGNOSIS — T43592A Poisoning by other antipsychotics and neuroleptics, intentional self-harm, initial encounter: Secondary | ICD-10-CM | POA: Diagnosis not present

## 2023-10-20 DIAGNOSIS — R440 Auditory hallucinations: Secondary | ICD-10-CM | POA: Insufficient documentation

## 2023-10-20 DIAGNOSIS — Z743 Need for continuous supervision: Secondary | ICD-10-CM | POA: Diagnosis not present

## 2023-10-20 DIAGNOSIS — F333 Major depressive disorder, recurrent, severe with psychotic symptoms: Secondary | ICD-10-CM | POA: Insufficient documentation

## 2023-10-20 DIAGNOSIS — T43591A Poisoning by other antipsychotics and neuroleptics, accidental (unintentional), initial encounter: Secondary | ICD-10-CM

## 2023-10-20 LAB — COMPREHENSIVE METABOLIC PANEL WITH GFR
ALT: 21 U/L (ref 0–44)
AST: 18 U/L (ref 15–41)
Albumin: 3.8 g/dL (ref 3.5–5.0)
Alkaline Phosphatase: 40 U/L (ref 38–126)
Anion gap: 8 (ref 5–15)
BUN: 13 mg/dL (ref 6–20)
CO2: 27 mmol/L (ref 22–32)
Calcium: 8.6 mg/dL — ABNORMAL LOW (ref 8.9–10.3)
Chloride: 107 mmol/L (ref 98–111)
Creatinine, Ser: 0.79 mg/dL (ref 0.44–1.00)
GFR, Estimated: >60 mL/min (ref >60)
Glucose, Bld: 109 mg/dL — ABNORMAL HIGH (ref 70–99)
Potassium: 3.2 mmol/L — ABNORMAL LOW (ref 3.5–5.1)
Sodium: 142 mmol/L (ref 135–145)
Total Bilirubin: 1.3 mg/dL — ABNORMAL HIGH (ref 0.0–1.2)
Total Protein: 6.1 g/dL — ABNORMAL LOW (ref 6.5–8.1)

## 2023-10-20 LAB — RAPID URINE DRUG SCREEN, HOSP PERFORMED
Amphetamines: NOT DETECTED
Barbiturates: NOT DETECTED
Benzodiazepines: NOT DETECTED
Cocaine: NOT DETECTED
Opiates: NOT DETECTED
Tetrahydrocannabinol: NOT DETECTED

## 2023-10-20 LAB — CBC
HCT: 36.3 % (ref 36.0–46.0)
Hemoglobin: 11.9 g/dL — ABNORMAL LOW (ref 12.0–15.0)
MCH: 29.5 pg (ref 26.0–34.0)
MCHC: 32.8 g/dL (ref 30.0–36.0)
MCV: 89.9 fL (ref 80.0–100.0)
Platelets: 265 10*3/uL (ref 150–400)
RBC: 4.04 MIL/uL (ref 3.87–5.11)
RDW: 13.6 % (ref 11.5–15.5)
WBC: 5.5 10*3/uL (ref 4.0–10.5)
nRBC: 0 % (ref 0.0–0.2)

## 2023-10-20 LAB — SALICYLATE LEVEL: Salicylate Lvl: <7 mg/dL — ABNORMAL LOW (ref 7.0–30.0)

## 2023-10-20 LAB — ETHANOL: Alcohol, Ethyl (B): <10 mg/dL (ref <10)

## 2023-10-20 LAB — ACETAMINOPHEN LEVEL
Acetaminophen (Tylenol), Serum: <10 ug/mL — ABNORMAL LOW (ref 10–30)
Acetaminophen (Tylenol), Serum: <10 ug/mL — ABNORMAL LOW (ref 10–30)

## 2023-10-20 LAB — MAGNESIUM: Magnesium: 2.3 mg/dL (ref 1.7–2.4)

## 2023-10-20 MED ORDER — POTASSIUM CHLORIDE CRYS ER 20 MEQ PO TBCR
40.0000 meq | EXTENDED_RELEASE_TABLET | Freq: Once | ORAL | Status: AC
  Administered 2023-10-20: 40 meq via ORAL
  Filled 2023-10-20: qty 2

## 2023-10-20 MED ORDER — LACTATED RINGERS IV BOLUS
1000.0000 mL | Freq: Once | INTRAVENOUS | Status: AC
  Administered 2023-10-20: 1000 mL via INTRAVENOUS

## 2023-10-20 NOTE — ED Notes (Addendum)
 Repeat EKG 0000, 2300 tylenol, aspirin level, replace k and mag high normal, benzo for agitations or seizures, IV pressors or fluid for hypotension, IV fluids bolus. Recommends 6 hours obs.

## 2023-10-20 NOTE — ED Notes (Signed)
 Rpt Tylenol level 2300; Rpt EKG 0000   Kingsley, Barnes Florek K, DO 10/20/23 2249

## 2023-10-20 NOTE — ED Triage Notes (Addendum)
 Pt BIBA from home, c/o intentional overdose and auditory hallucinations, took 6 mg risperidone. Pt stated she took more of the medication to try to get the voices to go away.  Pt stated she does not want to end her life. She also stated she wanted to go somewhere to get some help but could not get to a doctors office. 20G LAC. EMS talked to poison control and they said she may just be very drowsy. VSS.

## 2023-10-20 NOTE — ED Provider Notes (Signed)
 [place] EMERGENCY DEPARTMENT AT [facility] Provider Note   CSN: [id number] Arrival date & time: 10/20/23  2037     History  Chief Complaint  Patient presents with   Drug Overdose    Maria Blackburn is a 53 y.o. female.  Patient is a 53 year old female with a past medical history of depression and anxiety presenting to the emergency department with hallucinations and overdose.  Patient reported to EMS that she took 6 Risperdal  tonight and attempt to stop her auditory hallucinations.  She reports to me that she has not been feeling suicidal and this was not an attempt to kill herself.  She states that she has been having auditory hallucinations but just started, though was recently seen at [facility] earlier this month with similar complaints.  She states that these hallucinations are not telling her to hurt herself or anyone else.  She denies any additional coingestions.  The history is provided by the patient.  Drug Overdose       Home Medications Prior to Admission medications   Medication Sig Start Date End Date Taking? Authorizing Provider  atorvastatin  (LIPITOR) 20 MG tablet Take 1 tablet (20 mg total) by mouth daily. 10/04/23   Timmothy Foots, MD  hydrOXYzine  (VISTARIL ) 100 MG capsule Take 1 capsule (100 mg total) by mouth every 6 (six) hours as needed. 08/12/23   Senaida Dama, NP  potassium & sodium phosphates  (PHOS-NAK) 280-160-250 MG PACK Take 1 packet by mouth 3 (three) times daily. 10/05/23   Small, Brooke L, PA  risperiDONE  (RISPERDAL ) 1 MG tablet Take 1 tablet (1 mg total) by mouth at bedtime. 10/04/23   Timmothy Foots, MD  sertraline  (ZOLOFT ) 100 MG tablet Take 1.5 tablets (150 mg total) by mouth daily. 10/05/23   Timmothy Foots, MD  solifenacin  (VESICARE ) 10 MG tablet Take 1 tablet (10 mg total) by mouth daily. 05/31/23   Stoneking, Ponce Brisker., MD      Allergies    Patient has no known allergies.    Review of Systems   Review of Systems  Physical  Exam Updated Vital Signs BP 117/75   Pulse 96   Temp 97.8 F (36.6 C) (Oral)   Resp 18   Ht 5\' 6"  (1.676 m)   Wt 93 kg   SpO2 92%   BMI 33.09 kg/m  Physical Exam Vitals and nursing note reviewed.  Constitutional:      General: She is not in acute distress.    Comments: Drowsy, arousable to verbal stimulation but needs frequent stimulation to stay awake  HENT:     Head: Normocephalic and atraumatic.     Nose: Nose normal.     Mouth/Throat:     Mouth: Mucous membranes are moist.     Pharynx: Oropharynx is clear.  Eyes:     Extraocular Movements: Extraocular movements intact.     Conjunctiva/sclera: Conjunctivae normal.     Pupils: Pupils are equal, round, and reactive to light.     Comments: Pupils ~5-6 mm, slightly large for light in the room  Cardiovascular:     Rate and Rhythm: Normal rate and regular rhythm.     Heart sounds: Normal heart sounds.  Pulmonary:     Effort: Pulmonary effort is normal.     Breath sounds: Normal breath sounds.  Abdominal:     General: Abdomen is flat.     Palpations: Abdomen is soft.     Tenderness: There is no abdominal tenderness.  Musculoskeletal:  General: Normal range of motion.     Cervical back: Normal range of motion.  Skin:    General: Skin is warm and dry.  Neurological:     General: No focal deficit present.  Psychiatric:        Mood and Affect: Mood normal.        Behavior: Behavior normal.     ED Results / Procedures / Treatments   Labs (all labs ordered are listed, but only abnormal results are displayed) Labs Reviewed  COMPREHENSIVE METABOLIC PANEL WITH GFR - Abnormal; Notable for the following components:      Result Value   Potassium 3.2 (*)    Glucose, Bld 109 (*)    Calcium  8.6 (*)    Total Protein 6.1 (*)    Total Bilirubin 1.3 (*)    All other components within normal limits  SALICYLATE LEVEL - Abnormal; Notable for the following components:   Salicylate Lvl <7.0 (*)    All other components  within normal limits  ACETAMINOPHEN  LEVEL - Abnormal; Notable for the following components:   Acetaminophen  (Tylenol ), Serum <10 (*)    All other components within normal limits  CBC - Abnormal; Notable for the following components:   Hemoglobin 11.9 (*)    All other components within normal limits  ACETAMINOPHEN  LEVEL - Abnormal; Notable for the following components:   Acetaminophen  (Tylenol ), Serum <10 (*)    All other components within normal limits  ETHANOL  RAPID URINE DRUG SCREEN, HOSP PERFORMED  MAGNESIUM     EKG EKG Interpretation Date/Time:  Thursday October 20 2023 20:53:52 EDT Ventricular Rate:  100 PR Interval:  111 QRS Duration:  85 QT Interval:  523 QTC Calculation: 675 R Axis:   12  Text Interpretation: Sinus tachycardia Probable left atrial enlargement Borderline repolarization abnormality Prolonged QT interval Confirmed by Celesta Coke ([id number]) on 10/20/2023 9:26:24 PM  Radiology No results found.  Procedures .Critical Care  Performed by: Kingsley, Tarica Harl K, DO Authorized by: Nolberto Batty, DO   Critical care provider statement:    Critical care time (minutes):  30   Critical care was necessary to treat or prevent imminent or life-threatening deterioration of the following conditions:  Toxidrome   Critical care was time spent personally by me on the following activities:  Development of treatment plan with patient or surrogate, discussions with consultants, evaluation of patient's response to treatment, examination of patient, ordering and review of laboratory studies, ordering and review of radiographic studies, ordering and performing treatments and interventions, pulse oximetry, re-evaluation of patient's condition and review of old charts     Medications Ordered in ED Medications  potassium chloride  SA (KLOR-CON  M) CR tablet 40 mEq (40 mEq Oral Given 10/20/23 2219)  lactated ringers  bolus 1,000 mL (1,000 mLs Intravenous New Bag/Given 10/20/23  2326)    ED Course/ Medical Decision Making/ A&P Clinical Course as of 10/21/23 0018  Thu Oct 20, 2023  2221 Poison control recommendations: Repeat EKG 0000, 2300 tylenol , aspirin level, replace k and mag high normal, benzo for agitations or seizures, IV pressors or fluid for hypotension, IV fluids bolus. Recommends 6 hours obs [VK]  Fri Oct 21, 2023  0018 Patient signed out to Dr. Morris Arch pending medical clearance prior to TTS eval. [VK]    Clinical Course User Index [VK] Kingsley, Vartan Kerins K, DO  Medical Decision Making This patient presents to the ED with chief complaint(s) of hallucinations, overdose with pertinent past medical history of anxiety, depression which further complicates the presenting complaint. The complaint involves an extensive differential diagnosis and also carries with it a high risk of complications and morbidity.    The differential diagnosis includes psychosis, patient denies SI, intoxication, Co. ingestion  Additional history obtained: Additional history obtained from EMS  Records reviewed PCP and [facility] records  ED Course and Reassessment: On patient's arrival she is hemodynamically stable, drowsy but arousable to voice though easily falls back asleep on exam.  EKG on arrival did have a prolonged QT interval new compared to prior EKG.  Patient will have labs including mag, Tylenol  salicylate to evaluate for coingestions and other toxic ingestions.  She will be kept on telemetry.  Poison control will be called for further monitoring recommendations. Does not need IVC at this time but patient is agreeable for psychiatry evaluation of the hallucinations once she is medically cleared.  Independent labs interpretation:  The following labs were independently interpreted: mild hypokalemia, otherwise initial labs normal  Independent visualization of imaging: - N/A    Amount and/or Complexity of Data Reviewed Labs:  ordered.  Risk Prescription drug management.          Final Clinical Impression(s) / ED Diagnoses Final diagnoses:  Overdose of risperidone   Auditory hallucinations    Rx / DC Orders ED Discharge Orders     None         Kingsley, Akansha Wyche K, DO 10/21/23 0018

## 2023-10-21 ENCOUNTER — Inpatient Hospital Stay (HOSPITAL_COMMUNITY)
Admission: AD | Admit: 2023-10-21 | Discharge: 2023-10-27 | DRG: 885 | Disposition: A | Discharge status: Home / Self Care | Source: Intra-hospital | Attending: Psychiatry | Admitting: Psychiatry

## 2023-10-21 ENCOUNTER — Encounter (HOSPITAL_COMMUNITY): Payer: Self-pay | Admitting: Psychiatry

## 2023-10-21 DIAGNOSIS — Z833 Family history of diabetes mellitus: Secondary | ICD-10-CM

## 2023-10-21 DIAGNOSIS — F23 Brief psychotic disorder: Secondary | ICD-10-CM | POA: Diagnosis present

## 2023-10-21 DIAGNOSIS — Z8249 Family history of ischemic heart disease and other diseases of the circulatory system: Secondary | ICD-10-CM | POA: Diagnosis not present

## 2023-10-21 DIAGNOSIS — E876 Hypokalemia: Secondary | ICD-10-CM | POA: Diagnosis present

## 2023-10-21 DIAGNOSIS — F411 Generalized anxiety disorder: Secondary | ICD-10-CM | POA: Diagnosis present

## 2023-10-21 DIAGNOSIS — Z90710 Acquired absence of both cervix and uterus: Secondary | ICD-10-CM

## 2023-10-21 DIAGNOSIS — F333 Major depressive disorder, recurrent, severe with psychotic symptoms: Secondary | ICD-10-CM | POA: Insufficient documentation

## 2023-10-21 DIAGNOSIS — R000 Tachycardia, unspecified: Secondary | ICD-10-CM | POA: Diagnosis not present

## 2023-10-21 DIAGNOSIS — Z825 Family history of asthma and other chronic lower respiratory diseases: Secondary | ICD-10-CM

## 2023-10-21 DIAGNOSIS — R440 Auditory hallucinations: Secondary | ICD-10-CM | POA: Diagnosis not present

## 2023-10-21 DIAGNOSIS — T43592A Poisoning by other antipsychotics and neuroleptics, intentional self-harm, initial encounter: Secondary | ICD-10-CM | POA: Diagnosis not present

## 2023-10-21 DIAGNOSIS — F4310 Post-traumatic stress disorder, unspecified: Secondary | ICD-10-CM | POA: Diagnosis present

## 2023-10-21 DIAGNOSIS — K219 Gastro-esophageal reflux disease without esophagitis: Secondary | ICD-10-CM | POA: Diagnosis present

## 2023-10-21 DIAGNOSIS — F4381 Prolonged grief disorder: Secondary | ICD-10-CM | POA: Diagnosis present

## 2023-10-21 DIAGNOSIS — Z87828 Personal history of other (healed) physical injury and trauma: Secondary | ICD-10-CM | POA: Diagnosis not present

## 2023-10-21 DIAGNOSIS — Z9151 Personal history of suicidal behavior: Secondary | ICD-10-CM

## 2023-10-21 DIAGNOSIS — Z803 Family history of malignant neoplasm of breast: Secondary | ICD-10-CM

## 2023-10-21 DIAGNOSIS — Z79899 Other long term (current) drug therapy: Secondary | ICD-10-CM | POA: Diagnosis not present

## 2023-10-21 DIAGNOSIS — Z560 Unemployment, unspecified: Secondary | ICD-10-CM | POA: Diagnosis not present

## 2023-10-21 MED ORDER — RISPERIDONE 1 MG PO TABS
1.0000 mg | ORAL_TABLET | Freq: Every day | ORAL | Status: DC
  Administered 2023-10-21: 1 mg via ORAL
  Filled 2023-10-21 (×2): qty 1

## 2023-10-21 MED ORDER — HALOPERIDOL 5 MG PO TABS: 5.0000 mg | ORAL_TABLET | Freq: Three times a day (TID) | ORAL | Status: DC | PRN

## 2023-10-21 MED ORDER — DIPHENHYDRAMINE HCL 50 MG/ML IJ SOLN: 50.0000 mg | Freq: Three times a day (TID) | INTRAMUSCULAR | Status: DC | PRN

## 2023-10-21 MED ORDER — ACETAMINOPHEN 325 MG PO TABS: 650.0000 mg | ORAL_TABLET | ORAL | Status: DC | PRN

## 2023-10-21 MED ORDER — ATORVASTATIN CALCIUM 20 MG PO TABS
20.0000 mg | ORAL_TABLET | Freq: Every day | ORAL | Status: DC
  Administered 2023-10-21 – 2023-10-27 (×7): 20 mg via ORAL
  Filled 2023-10-21 (×3): qty 1
  Filled 2023-10-21: qty 2
  Filled 2023-10-21 (×5): qty 1

## 2023-10-21 MED ORDER — HALOPERIDOL LACTATE 5 MG/ML IJ SOLN: 10.0000 mg | Freq: Three times a day (TID) | INTRAMUSCULAR | Status: DC | PRN

## 2023-10-21 MED ORDER — HALOPERIDOL LACTATE 5 MG/ML IJ SOLN: 5.0000 mg | Freq: Three times a day (TID) | INTRAMUSCULAR | Status: DC | PRN

## 2023-10-21 MED ORDER — LORAZEPAM 2 MG/ML IJ SOLN: 2.0000 mg | Freq: Three times a day (TID) | INTRAMUSCULAR | Status: DC | PRN

## 2023-10-21 MED ORDER — FESOTERODINE FUMARATE ER 4 MG PO TB24
4.0000 mg | ORAL_TABLET | Freq: Every day | ORAL | Status: DC
  Administered 2023-10-21 – 2023-10-27 (×7): 4 mg via ORAL
  Filled 2023-10-21 (×8): qty 1

## 2023-10-21 MED ORDER — ALUM & MAG HYDROXIDE-SIMETH 200-200-20 MG/5ML PO SUSP: 30.0000 mL | ORAL | Status: DC | PRN

## 2023-10-21 MED ORDER — ACETAMINOPHEN 325 MG PO TABS
650.0000 mg | ORAL_TABLET | Freq: Four times a day (QID) | ORAL | Status: DC | PRN
  Administered 2023-10-21 – 2023-10-23 (×2): 650 mg via ORAL
  Filled 2023-10-21 (×2): qty 2

## 2023-10-21 MED ORDER — SODIUM CHLORIDE 0.9 % IV BOLUS
1000.0000 mL | Freq: Once | INTRAVENOUS | Status: AC
  Administered 2023-10-21: 1000 mL via INTRAVENOUS

## 2023-10-21 MED ORDER — SERTRALINE HCL 50 MG PO TABS
150.0000 mg | ORAL_TABLET | Freq: Every day | ORAL | Status: DC
Start: 2023-10-21 — End: 2023-10-22
  Administered 2023-10-21 – 2023-10-22 (×2): 150 mg via ORAL
  Filled 2023-10-21 (×5): qty 3

## 2023-10-21 MED ORDER — MAGNESIUM HYDROXIDE 400 MG/5ML PO SUSP: 30.0000 mL | Freq: Every day | ORAL | Status: DC | PRN

## 2023-10-21 MED ORDER — MAGNESIUM SULFATE IN D5W 1-5 GM/100ML-% IV SOLN
1.0000 g | Freq: Once | INTRAVENOUS | Status: AC
  Administered 2023-10-21: 1 g via INTRAVENOUS
  Filled 2023-10-21: qty 100

## 2023-10-21 MED ORDER — POTASSIUM & SODIUM PHOSPHATES 280-160-250 MG PO PACK
1.0000 | PACK | Freq: Three times a day (TID) | ORAL | Status: DC
  Administered 2023-10-21 – 2023-10-22 (×3): 1 via ORAL
  Filled 2023-10-21 (×5): qty 1

## 2023-10-21 MED ORDER — DIPHENHYDRAMINE HCL 25 MG PO CAPS: 50.0000 mg | ORAL_CAPSULE | Freq: Three times a day (TID) | ORAL | Status: DC | PRN

## 2023-10-21 MED ORDER — TRAZODONE HCL 50 MG PO TABS
50.0000 mg | ORAL_TABLET | Freq: Every evening | ORAL | Status: DC | PRN
  Administered 2023-10-21: 50 mg via ORAL
  Filled 2023-10-21: qty 1

## 2023-10-21 MED ORDER — HYDROXYZINE HCL 50 MG PO TABS: 100.0000 mg | ORAL_TABLET | Freq: Four times a day (QID) | ORAL | Status: DC | PRN

## 2023-10-21 NOTE — ED Notes (Addendum)
 EKG repeat at 0500 per poison control

## 2023-10-21 NOTE — Consult Note (Signed)
 [facility] Psychiatric Consult Initial  Patient Name: .Maria Blackburn  MRN: [id number]  DOB: 05/02/1971  Consult Order details:  Orders (From admission, onward)     Start     Ordered   10/21/23 0701  CONSULT TO CALL ACT TEAM       Ordering Provider: Lindle Rhea, MD  Provider:  (Not yet assigned)  Question:  Reason for Consult?  Answer:  Psych consult   10/21/23 0700             Mode of Visit: In person    Psychiatry Consult Evaluation  Service Date: October 21, 2023 LOS:  LOS: 0 days  Chief Complaint  "hallucinations"   Primary Psychiatric Diagnoses  MDD with psychosis  Anxiety  Assessment  Maria Blackburn is a 53 y.o. female admitted: Presented to the ED on 10/20/2023  8:40 PM for auditory hallucinations. She carries the psychiatric diagnoses of MDD, and anxiety and has a past medical history of overactive bladder.    Her current presentation of paranoia, anxiety, auditory hallucinations, stress, feeling  is most consistent with MDD with psychosis. She meets criteria for inpatient psychiatric admission based on current symptoms.  Current outpatient psychotropic medications include Zoloft , vistaril  and historically she has had a positive response to these medications. She was compliant with medications prior to admission as evidenced by patient report. On initial examination, patient is pleasant and cooperative. Please see plan below for detailed recommendations.   Diagnoses:  Active Hospital problems: Principal Problem:   MDD (major depressive disorder), recurrent, severe, with psychosis (HCC) Active Problems:   GAD (generalized anxiety disorder)    Plan   ## Psychiatric Medication Recommendations:  Give Atarax  25 mg PO TID PRN for anxiety Give Trazodone  50 mg PO PRN for sleep  ## Medical Decision Making Capacity: Not specifically addressed in this encounter   ## Further Work-up:  -- No further workup needed at this time EKG or UDS -- most recent EKG on  09/29/23 had QtC of 435 -- Pertinent labwork reviewed earlier this admission includes:, CMP, EKG, CBC, UDS     ## Disposition:-- We recommend inpatient psychiatric hospitalization. Patient is under voluntary admission status at this time; please IVC if attempts to leave hospital.   ## Behavioral / Environmental: -To minimize splitting of staff, assign one staff person to communicate all information from the team when feasible. or Utilize compassion and acknowledge the patient's experiences while setting clear and realistic expectations for care.                ## Safety and Observation Level:  - Based on my clinical evaluation, I estimate the patient to be at low risk of self harm in the current setting. - At this time, we recommend  routine. This decision is based on my review of the chart including patient's history and current presentation, interview of the patient, mental status examination, and consideration of suicide risk including evaluating suicidal ideation, plan, intent, suicidal or self-harm behaviors, risk factors, and protective factors. This judgment is based on our ability to directly address suicide risk, implement suicide prevention strategies, and develop a safety plan while the patient is in the clinical setting. Please contact our team if there is a concern that risk level has changed.   CSSR Risk Category:C-SSRS RISK CATEGORY: No Risk   Suicide Risk Assessment: Patient has following modifiable risk factors for suicide: under treated depression paranoia and new onset auditory hallucination, which we are addressing by recommending inpatient  psychiatric admission. Patient has following non-modifiable or demographic risk factors for suicide: None Patient has the following protective factors against suicide: Supportive family and Minor children in the home   Thank you for this consult request. Recommendations have been communicated to the primary team.  We will recommend inpatient  psychiatric admission at this time.   Chandra Come, PMHNP       History of Present Illness  Relevant Aspects of Hospital ED Course:  Admitted on 10/20/2023 for depression and anxiety  Patient Report:  Maria Blackburn, 53 y.o., female patient seen face to face by this provider, consulted with Dr. Deborah Falling; and chart reviewed on 11/05/23.  On evaluation Maria Blackburn reports that she took 6 Risperdal  tonight and attempt to stop her auditory hallucinations. She reports to me that she has not been feeling suicidal and this was not an attempt to kill herself. She states that she has been having auditory hallucinations but just started, though was recently seen at [facility] earlier this month with similar complaints. She states that these hallucinations are not telling her to hurt herself or anyone else.   She states that she does have a lot of stress, being a wife, being a mom, also is taking college courses for health science and history degree, she also states that she has very little support in raising her niece's son.  Patient denies any alcohol or drug usage, UDS and BAL are negative.  She states that she does have a family history of bipolar, denies any outpatient services, no therapy or psychiatric provider.  Patient states she does also have an overactive bladder.   During evaluation Maria Blackburn is sitting up in her hospital bed, appears to be in no acute distress.  She is alert, oriented x 4, calm, cooperative and attentive.  Her mood is euthymic with congruent affect. She has normal speech, and behavior.  Patient states that she is intermittently having auditory hallucinations of her niece and niece's boyfriend, while this provider is speaking with her she states that they are telling her that no one will believe her or help her and that they are going to kill her.  Objectively there is no evidence of psychosis/mania or delusional thinking.  Patient is able to converse coherently, goal directed  thoughts, no distractibility, or pre-occupation. She also denies suicidal/self-harm/homicidal ideation, and paranoia.    Psych ROS:  Depression: Endorses Anxiety: Endorses Mania (lifetime and current): Denies Psychosis: (lifetime and current): Currently  Collateral information:  Contacted None  Review of Systems  Psychiatric/Behavioral:  Positive for depression, hallucinations and suicidal ideas.      Psychiatric and Social History  Psychiatric History:  Information collected from patient and husband   Prev Dx/Sx: Anxiety and depression Current Psych Provider: None Home Meds (current): Zoloft  and Atarax , B12 Previous Med Trials: No Therapy: None   Prior Psych Hospitalization: Denies Prior Self Harm: Denies Prior Violence: Denies   Family Psych History: Yes Family Hx suicide: Denies   Social History:  Developmental Hx: Deferred Educational Hx: Patient graduated high school, currently enrolled in college Occupational Hx: Unemployed Legal Hx: Denies Living Situation: Lives at home with her husband and son, and great nephew Spiritual Hx: Yes Access to weapons/lethal means: Denies   Substance History Patient denies any substance abuse history, or alcohol.  UDS is negative and BAL less than 10.  Exam Findings  Physical Exam:  Vital Signs:  Temp:  [97.8 F (36.6 C)-97.9 F (36.6 C)] 97.8 F (36.6 C) (  04/18 0518) Pulse Rate:  [70-98] 71 (04/18 0518) Resp:  [14-22] 17 (04/18 0518) BP: (103-133)/(62-83) 103/62 (04/18 0518) SpO2:  [92 %-95 %] 93 % (04/18 0518) Weight:  [93 kg] 93 kg (04/17 2056) Blood pressure 103/62, pulse 71, temperature 97.8 F (36.6 C), temperature source Oral, resp. rate 17, height 5\' 6"  (1.676 m), weight 93 kg, SpO2 93%. Body mass index is 33.09 kg/m.  Physical Exam Vitals and nursing note reviewed.  Neurological:     Mental Status: She is alert.  Psychiatric:        Attention and Perception: Attention normal.        Mood and Affect: Mood  is anxious and depressed. Affect is flat.        Speech: Speech normal.        Behavior: Behavior is cooperative.        Judgment: Judgment is inappropriate.      Mental Status Exam: General Appearance: Casual  Orientation:  Full (Time, Place, and Person)  Memory:  Immediate;   Fair Remote;   Fair  Concentration:  Concentration: Fair and Attention Span: Fair  Recall:  Fair  Attention  Fair  Eye Contact:  Good  Speech:  Clear and Coherent  Language:  Good  Volume:  Normal  Mood: euthymic  Affect:  Appropriate  Thought Process:  Coherent  Thought Content:  WDL  Suicidal Thoughts:  No  Homicidal Thoughts:  No  Judgement:  Fair  Insight:  Fair  Psychomotor Activity:  Normal  Akathisia:  No  Fund of Knowledge:  Fair    Assets:  Manufacturing systems engineer Desire for Improvement Housing Social Support Vocational/Educational  Cognition:  WNL  ADL's:  Intact  AIMS (if indicated):        Other History   These have been pulled in through the EMR, reviewed, and updated if appropriate.  Family History:  The patient's family history includes Diabetes in her son; Heart disease in her mother.  Medical History: Past Medical History:  Diagnosis Date   Anxiety    Bladder spasms    Frequency of urination    GERD (gastroesophageal reflux disease)    Interstitial cystitis    Pelvic pain    SUI (stress urinary incontinence, female)    Urgency of urination     Surgical History: Past Surgical History:  Procedure Laterality Date   ABDOMINAL HYSTERECTOMY  2000   ovaries remain   CYSTO WITH HYDRODISTENSION N/A 03/24/2017   Procedure: CYSTOSCOPY/HYDRODISTENSION INSTILLATION OF MARCAINE  AND PYRIDIUM ;  Surgeon: Homero Luster, MD;  Location: [facility];  Service: Urology;  Laterality: N/A;   HYSTEROSCOPY WITH D & C  1999   POLYPECTOMY   TUBAL LIGATION Bilateral 1996     Medications:   Current Facility-Administered Medications:    acetaminophen  (TYLENOL ) tablet 650  mg, 650 mg, Oral, Q4H PRN, Cardama, Camila Cecil, MD  Current Outpatient Medications:    atorvastatin  (LIPITOR) 20 MG tablet, Take 1 tablet (20 mg total) by mouth daily., Disp: 30 tablet, Rfl: 0   hydrOXYzine  (VISTARIL ) 100 MG capsule, Take 1 capsule (100 mg total) by mouth every 6 (six) hours as needed., Disp: 30 capsule, Rfl: 2   potassium & sodium phosphates  (PHOS-NAK) 280-160-250 MG PACK, Take 1 packet by mouth 3 (three) times daily., Disp: 6 each, Rfl: 0   risperiDONE  (RISPERDAL ) 1 MG tablet, Take 1 tablet (1 mg total) by mouth at bedtime., Disp: 30 tablet, Rfl: 0   sertraline  (ZOLOFT ) 100 MG tablet, Take 1.5  tablets (150 mg total) by mouth daily., Disp: 45 tablet, Rfl: 0   solifenacin  (VESICARE ) 10 MG tablet, Take 1 tablet (10 mg total) by mouth daily., Disp: 30 tablet, Rfl: 11  Allergies: No Known Allergies  Elinor Kleine MOTLEY-MANGRUM, PMHNP

## 2023-10-21 NOTE — ED Notes (Signed)
Pt belongings placed in locker 36.

## 2023-10-21 NOTE — ED Notes (Signed)
 Patient report was called to Fredrik Jensen, Charity fundraiser at [facility]

## 2023-10-21 NOTE — Plan of Care (Signed)

## 2023-10-21 NOTE — Progress Notes (Signed)
 Per [facility], pt accepted to [facility] with Bed Assignment:  307-2 on 10/21/23   Pt meets inpatient criteria per: Beaulah Bouquet, NP   Attending Physician will be:  Linnie Riches, MD   Report can be called to: [phone]   Pt can arrive any time after 1pm.   Care Team Notified:  Katheren Palomino A Motley-Mangrum,NP, Maritza Sidles, Paramedic     Macky Sayres, [facility]

## 2023-10-21 NOTE — [facility] Group Notes (Signed)
 [facility] Group Notes:  (Nursing/MHT/Case Management/Adjunct)  Date:  10/21/2023  Time:  8:21 PM  Type of Therapy:   Wrap Up Group  Participation Level:  Active  Participation Quality:  Appropriate  Affect:  Appropriate  Cognitive:  Appropriate  Insight:  Appropriate and Improving  Engagement in Group:  Engaged  Modes of Intervention:  Discussion and Support  Summary of Progress/Problems: Patient participated in group appropriately.  Maria Blackburn 10/21/2023, 8:21 PM

## 2023-10-21 NOTE — Progress Notes (Signed)
   10/21/23 1607  Psych Admission Type (Psych Patients Only)  Admission Status Voluntary  Psychosocial Assessment  Patient Complaints Anxiety  Eye Contact Fair  Facial Expression Anxious  Affect Anxious  Speech Soft  Interaction Assertive  Motor Activity Slow  Appearance/Hygiene Unremarkable  Behavior Characteristics Appropriate to situation  Mood Anxious  Thought Process  Coherency WDL  Content WDL  Delusions None reported or observed  Perception WDL  Hallucination Auditory  Judgment WDL  Confusion None  Danger to Self  Current suicidal ideation? Denies  Agreement Not to Harm Self Yes  Description of Agreement Verbal

## 2023-10-21 NOTE — Tx Team (Signed)
 Initial Treatment Plan 10/21/2023 5:13 PM Maria Blackburn MVH:[id number]    PATIENT STRESSORS: Health problems     PATIENT STRENGTHS: Ability for insight  Active sense of humor  Average or above average intelligence  Capable of independent living  Communication skills  Financial means  General fund of knowledge  Motivation for treatment/growth    PATIENT IDENTIFIED PROBLEMS: Hearing voices, poor self medication  DISCHARGE CRITERIA:  Adequate post-discharge living arrangements Improved stabilization in mood, thinking, and/or behavior Motivation to continue treatment in a less acute level of care  PRELIMINARY DISCHARGE PLAN: Return to previous living arrangement  PATIENT/FAMILY INVOLVEMENT: This treatment plan has been presented to and reviewed with the patient, Maria Blackburn, The patient and family have been given the opportunity to ask questions and make suggestions.  Balinda Bong, RN 10/21/2023, 5:13 PM

## 2023-10-21 NOTE — ED Notes (Signed)
 This nurse spoke to poison control, Patty stated we should continue observing pt until qtc are under 500, then pt should be okay to dc.

## 2023-10-21 NOTE — ED Provider Notes (Addendum)
 I assumed care of this patient from previous provider.  Please see their note for further details of history, exam, and MDM.   Briefly patient is a 53 y.o. female who presented who presented for intentional overdose.  Patient is currently voluntary but needs to stay.  IVC paperwork filled out by prior provider but not filed at this time.  Labs are reassuring other than mild hypokalemia.  EKG did note prolonged QTc.  Currently awaiting repeat EKGs for clearance.  7:00 AM QTc less than 500 at this time.  Patient is medically cleared for behavioral health evaluation and management.  .Critical Care  Performed by: Trine Raynell Moder, MD Authorized by: Trine Raynell Moder, MD   Critical care provider statement:    Critical care time (minutes):  30   Critical care was necessary to treat or prevent imminent or life-threatening deterioration of the following conditions: overdose with significantly prolonged QTc.   Critical care was time spent personally by me on the following activities:  Development of treatment plan with patient or surrogate, discussions with consultants, evaluation of patient's response to treatment, examination of patient, ordering and review of laboratory studies, ordering and review of radiographic studies, ordering and performing treatments and interventions, pulse oximetry, re-evaluation of patient's condition and review of old charts   I assumed direction of critical care for this patient from another provider in my specialty: yes     [facility] consulted. PRN meds ordered.     Trine Raynell Moder, MD 10/21/23 [phone]

## 2023-10-22 DIAGNOSIS — F333 Major depressive disorder, recurrent, severe with psychotic symptoms: Secondary | ICD-10-CM | POA: Diagnosis not present

## 2023-10-22 DIAGNOSIS — F4310 Post-traumatic stress disorder, unspecified: Secondary | ICD-10-CM | POA: Insufficient documentation

## 2023-10-22 LAB — URINALYSIS, ROUTINE W REFLEX MICROSCOPIC
Bilirubin Urine: NEGATIVE
Glucose, UA: NEGATIVE mg/dL
Hgb urine dipstick: NEGATIVE
Ketones, ur: NEGATIVE mg/dL
Leukocytes,Ua: NEGATIVE
Nitrite: NEGATIVE
Protein, ur: NEGATIVE mg/dL
Specific Gravity, Urine: 1.006 (ref 1.005–1.030)
pH: 7 (ref 5.0–8.0)

## 2023-10-22 MED ORDER — QUETIAPINE FUMARATE 25 MG PO TABS: 25.0000 mg | ORAL_TABLET | Freq: Three times a day (TID) | ORAL | Status: DC | PRN

## 2023-10-22 MED ORDER — SERTRALINE HCL 100 MG PO TABS
200.0000 mg | ORAL_TABLET | Freq: Every day | ORAL | Status: DC
Start: 2023-10-23 — End: 2023-10-27
  Administered 2023-10-23 – 2023-10-27 (×5): 200 mg via ORAL
  Filled 2023-10-22 (×6): qty 2

## 2023-10-22 MED ORDER — TRAZODONE HCL 50 MG PO TABS
50.0000 mg | ORAL_TABLET | Freq: Every day | ORAL | Status: DC
Start: 2023-10-22 — End: 2023-10-27
  Administered 2023-10-22 – 2023-10-26 (×5): 50 mg via ORAL
  Filled 2023-10-22 (×6): qty 1

## 2023-10-22 NOTE — H&P (Addendum)
 [facility] [place] MD Admission Note  Patient Identification: Maria Blackburn MRN:  [id number] Date of Evaluation:  10/22/2023  Chief Complaint:  MDD (major depressive disorder), recurrent, severe, with psychosis (HCC) [F33.3],  MDD (major depressive disorder), recurrent, severe, with psychosis (HCC)  Principal Problem:   MDD (major depressive disorder), recurrent, severe, with psychosis (HCC) Active Problems:   Acute psychosis (HCC)   PTSD (post-traumatic stress disorder)   History of Present Illness:  Maria Blackburn is a 53 y.o. female with a past psychiatric history of major depressive disorder and generalized anxiety disorder admitted voluntarily to the [facility] from [facility] Emergency Department for evaluation and management of auditory hallucinations and paranoia which she had attempted to take 6 tablets of 1 mg risperidone  and 3 tablets of 100 mg hydroxyzine . She had recently been discharged from [facility] on 10/04/23. Please see 09/29/23 H&P for details about hospitalization.  Patient reports that since last psychiatric hospitalization, she continued to struggle with auditory hallucinations.  She reports that they almost immediately recurred and progressively gotten worse after returning home.  She states that her symptoms of psychosis are similar to before with the exception that she began to hear her son's voice fully knowing that he was at work while she was at home.  She reports hearing that he was talking with an unknown female and the female was threatening him to lie in court or they would hurt son.  Patient checked with husband to see if he heard any of the things that she did when he did not, patient on a walk and the voices continue to follow her.  She denies VH and reports that her present auditory hallucinations appear to be improving since being hospitalized.  She reports that she did not return to the hospital earlier because she had assumed that the risperidone  will take time to  fully work.  She reports this psychotic symptoms are persistent throughout the day.  She attempted to take more risperidone  and hydroxyzine  to help with her mental health and the hallucinations but they continued to be unbearable so she came to the hospital to be further evaluated.  She adamantly denies that this was an attempt to kill herself or harm herself in any way.  She denies any clear environmental changes that would explain why she was actively experiencing the symptoms. Patient reports that current level of stress remains very high citing she was taking online classes, taking care of nieces son, financial stress, and auditory hallucinations.  She denied any recent substance abuse.  She continues to feel that she has been struggling with depression and states she has felt a prolonged grief that dates back to when brother in law abruptly died several years ago.  She denies present SI/HI.  She also states that the situation related to her niece threatening to kidnap niece's son whom patient has custody of was very traumatic and she endorses hypervigilance, negative alterations in mood, intrusive thoughts, flashbacks, avoidance, and sporadic nightmares.  She does endorse poor sleep for extensive amounts of time stating she only slept approximately 4 hours per night approximately 2 weeks prior to any psychotic symptoms and has had limited sleep since returning home as well.  She reports eating appropriately but does state she does not eat very healthily.  She states that she wanted to start therapy to address her PTSD, grief, and anxiety but did not have the opportunity to before the psychosis began.  We discussed reality testing techniques given that she  is also recognizing that these hallucinations are not true.   Chart review: On chart review, prior to this evaluation, patient has not been seen previously for a psychiatric emergency. She has been seen previously in Oct 2023 in the emergency room for a  hematoma from a fall at work.  Subjective Sleep past 24 hours: poor Subjective Appetite past 24 hours: fair  Collateral information obtained Xiamara Hulet, patient's husband, [phone]) Patient granted permission to speak to contact person without restrictions.   Past Psychiatric History:  Previous psych diagnoses:  Patient reported history of depressive symptoms since 1999, officially diagnosed with major depressive disorder in 2016. Patient also reported a diagnosis of generalized anxiety disorder and panic disorder in 2007. Prior inpatient psychiatric treatment: Denies Prior outpatient psychiatric treatment: Denies Current psychiatric provider: Denies  Neuromodulation history: denies  Current therapist: Denies Psychotherapy hx: Denies  History of suicide attempts: Patient denied, but did report intermittent thoughts of passive SI without plan or intent since 2016. Patient stated last passive SI was 3 months ago History of homicide: Denies  Psychotropic medications: Current Zoloft  100 mg once a day - patient reported taking consistently with poor response and no side effects  Hydroxyzine  100 mg every 6 hours prn - patient reported taking as needed  Past Patient reported not remembering past medication trials, efficacies, or side effects  Substance Use History: Alcohol:  Patient reported having one mixed drink every couple of years; reported last drink was 2021 Hx withdrawal tremors/shakes: denies Hx alcohol related blackouts: denies Hx alcohol induced hallucinations: denies Hx alcoholic seizures: denies Hx medical hospitalization due to severe alcohol withdrawal symptoms: denies DUI: denies  --------  Tobacco: Patient reported trying 4-5 cigarettes once at the age of 23; denied any other use Cannabis (marijuana): Patient reported trying once or twice at the age of 57; denied any other use Cocaine: Denied Methamphetamines: Denied Psilocybin (mushrooms):  Denied Ecstasy (MDMA / molly): Denied LSD (acid): Denied Opiates (fentanyl  / heroin): Denied Benzos (Xanax, Klonopin): Denied IV drug use: Denied Prescribed meds abuse: Denied  History of detox: Denied History of rehab: Denied  Is the patient at risk to self? Yes Has the patient been a risk to self in the past 6 months? Yes Has the patient been a risk to self within the distant past? Unknown Is the patient a risk to others? Unknown Has the patient been a risk to others in the past 6 months? Unknown Has the patient been a risk to others within the distant past? Unknown  Alcohol Screening: 1. How often do you have a drink containing alcohol?: Never 2. How many drinks containing alcohol do you have on a typical day when you are drinking?: 1 or 2 3. How often do you have six or more drinks on one occasion?: Never AUDIT-C Score: 0 4. How often during the last year have you found that you were not able to stop drinking once you had started?: Never 5. How often during the last year have you failed to do what was normally expected from you because of drinking?: Never 6. How often during the last year have you needed a first drink in the morning to get yourself going after a heavy drinking session?: Never 7. How often during the last year have you had a feeling of guilt of remorse after drinking?: Never 8. How often during the last year have you been unable to remember what happened the night before because you had been drinking?: Never 9. Have you or  someone else been injured as a result of your drinking?: No 10. Has a relative or friend or a doctor or another health worker been concerned about your drinking or suggested you cut down?: No Alcohol Use Disorder Identification Test Final Score (AUDIT): 0 Alcohol Brief Interventions/Follow-up: Alcohol education/Brief advice Tobacco Screening:    Substance Abuse History in the last 12 months: No  Allergies: patient endorses no known allergies  to medications or food  Past Medical/Surgical History:  Medical Diagnoses: Patient reported: a diagnosis of interstitial cystitis in 1998, vertigo in 2019, and overactive bladder in Nov 2024 Home Rx: Patient reported taking: Tylenol  500 mg as needed for headaches, Dramamine as needed for vertigo, Solefenacin 10 mg 1/2 pill every other day for overactive bladder (reduced dosing due to side effect of dry mouth), Phentermine  30 mg once a day - discontinued in Jan 2025 after supply ran out Prior Hosp: Patient endorsed hospitalization for surgeries mentioned below. Prior Surgeries / non-head trauma: Patient endorsed surgeries for tubal ligation, hysteroscopy, cystoscopy, and hysterectomy  Head trauma: Patient reported history of head trauma during fall in Oct 2023 LOC: denies Concussions: endorses Seizures: denies  Last menstrual period and contraceptives: Patient reported last menstrual period in 2000 prior to hysterectomy; Denied being sexually active currently  Family History:  Medical: Patient reported: Mother - hx of heart failure and COPD; Father - hx of skin cancer; Two aunts - hx of DM; Grandmother - hx of breast cancer Psych: Patient reported: Father - hx of PTSD from service; Two aunts - hx of Bipolar Depression Psych Rx: Patient reported that her aunts take medication, but she is unsure of specifics Suicide: Patient reported that one of her aunts has two previous suicide attempts via overdose Homicide: Patient denied Substance use family hx: Patient reported a history of heavy drinking in her uncles.  Social History:  Place of birth and grew up where: Reported born and raised in [place], [place]; currently lives in [place] Abuse: Patient denied. Marital Status: Patient reported being married to her husband since the age of 56. Sexual orientation: straight Children: Patient reported two biological children - eldest child has a diagnosis of Autism and needs help with ADLs; Patient  also reported taking care of niece's son Employment: unemployed Highest level of education:  Patient reported dropping out of high school after 9th grade, later receiving her GED in 1997 and associate's degree in 2012, and is currently enrolled in online classes. Housing:  Lives in a trailer park with husband and eldest son Finances: receives money from husband and son Legal: no Special educational needs teacher: never served Consulting civil engineer: denies owning any firearms Pills stockpile: Patient denied pills stockpile.  Lab Results:  Results for orders placed or performed during the hospital encounter of 10/20/23 (from the past 48 hours)  Rapid urine drug screen (hospital performed)     Status: None   Collection Time: 10/20/23  9:13 PM  Result Value Ref Range   Opiates NONE DETECTED NONE DETECTED   Cocaine NONE DETECTED NONE DETECTED   Benzodiazepines NONE DETECTED NONE DETECTED   Amphetamines NONE DETECTED NONE DETECTED   Tetrahydrocannabinol NONE DETECTED NONE DETECTED   Barbiturates NONE DETECTED NONE DETECTED    Comment: (NOTE) DRUG SCREEN FOR MEDICAL PURPOSES ONLY.  IF CONFIRMATION IS NEEDED FOR ANY PURPOSE, NOTIFY LAB WITHIN 5 DAYS.  LOWEST DETECTABLE LIMITS FOR URINE DRUG SCREEN Drug Class  Cutoff (ng/mL) Amphetamine and metabolites    1000 Barbiturate and metabolites    200 Benzodiazepine                 200 Opiates and metabolites        300 Cocaine and metabolites        300 THC                            50 Performed at [facility], [address], [place], [place] [address]   Comprehensive metabolic panel     Status: Abnormal   Collection Time: 10/20/23  9:25 PM  Result Value Ref Range   Sodium 142 135 - 145 mmol/L   Potassium 3.2 (L) 3.5 - 5.1 mmol/L   Chloride 107 98 - 111 mmol/L   CO2 27 22 - 32 mmol/L   Glucose, Bld 109 (H) 70 - 99 mg/dL    Comment: Glucose reference range applies only to samples taken after fasting for at least 8 hours.    BUN 13 6 - 20 mg/dL   Creatinine, Ser 1.91 0.44 - 1.00 mg/dL   Calcium  8.6 (L) 8.9 - 10.3 mg/dL   Total Protein 6.1 (L) 6.5 - 8.1 g/dL   Albumin 3.8 3.5 - 5.0 g/dL   AST 18 15 - 41 U/L   ALT 21 0 - 44 U/L   Alkaline Phosphatase 40 38 - 126 U/L   Total Bilirubin 1.3 (H) 0.0 - 1.2 mg/dL   GFR, Estimated >47 >82 mL/min    Comment: (NOTE) Calculated using the CKD-EPI Creatinine Equation (2021)    Anion gap 8 5 - 15    Comment: Performed at [facility] [place], [address] [place], [place] [address]  Ethanol     Status: None   Collection Time: 10/20/23  9:25 PM  Result Value Ref Range   Alcohol, Ethyl (B) <10 <10 mg/dL    Comment: (NOTE) For medical purposes only. Performed at [facility], [address] [place], [place] [address]   Salicylate level     Status: Abnormal   Collection Time: 10/20/23  9:25 PM  Result Value Ref Range   Salicylate Lvl <7.0 (L) 7.0 - 30.0 mg/dL    Comment: Performed at [facility], [address] [place], [place] [address]  Acetaminophen  level     Status: Abnormal   Collection Time: 10/20/23  9:25 PM  Result Value Ref Range   Acetaminophen  (Tylenol ), Serum <10 (L) 10 - 30 ug/mL    Comment: (NOTE) Therapeutic concentrations vary significantly. A range of 10-30 ug/mL  may be an effective concentration for many patients. However, some  are best treated at concentrations outside of this range. Acetaminophen  concentrations >150 ug/mL at 4 hours after ingestion  and >50 ug/mL at 12 hours after ingestion are often associated with  toxic reactions.  Performed at [facility], [address], [place], [place] [address]   cbc     Status: Abnormal   Collection Time: 10/20/23  9:25 PM  Result Value Ref Range   WBC 5.5 4.0 - 10.5 K/uL   RBC 4.04 3.87 - 5.11 MIL/uL   Hemoglobin 11.9 (L) 12.0 - 15.0 g/dL   HCT 84.1 32.4 - 40.1 %   MCV 89.9 80.0 - 100.0 fL   MCH 29.5 26.0 - 34.0  pg   MCHC 32.8 30.0 - 36.0 g/dL   RDW  13.6 11.5 - 15.5 %   Platelets 265 150 - 400 K/uL   nRBC 0.0 0.0 - 0.2 %    Comment: Performed at [facility], [address] [place], [place] [address]  Magnesium      Status: None   Collection Time: 10/20/23  9:25 PM  Result Value Ref Range   Magnesium  2.3 1.7 - 2.4 mg/dL    Comment: Performed at [facility], [address] [place], [place] [address]  Acetaminophen  level     Status: Abnormal   Collection Time: 10/20/23 11:33 PM  Result Value Ref Range   Acetaminophen  (Tylenol ), Serum <10 (L) 10 - 30 ug/mL    Comment: (NOTE) Therapeutic concentrations vary significantly. A range of 10-30 ug/mL  may be an effective concentration for many patients. However, some  are best treated at concentrations outside of this range. Acetaminophen  concentrations >150 ug/mL at 4 hours after ingestion  and >50 ug/mL at 12 hours after ingestion are often associated with  toxic reactions.  Performed at [facility], [address], [place], [place] [address]     Blood Alcohol level:  Lab Results  Component Value Date   [facility] <10 10/20/2023   ETH <10 09/29/2023    Metabolic Disorder Labs:  Lab Results  Component Value Date   HGBA1C 5.9 (H) 07/07/2023   No results found for: "PROLACTIN" Lab Results  Component Value Date   CHOL 247 (H) 08/12/2023   TRIG 135 08/12/2023   HDL 54 08/12/2023   CHOLHDL 4.6 (H) 08/12/2023   VLDL 18 03/20/2016   LDLCALC 169 (H) 08/12/2023   LDLCALC 147 (H) 07/07/2023    Current Medications: Current Facility-Administered Medications  Medication Dose Route Frequency Provider Last Rate Last Admin   acetaminophen  (TYLENOL ) tablet 650 mg  650 mg Oral Q6H PRN Motley-Mangrum, Jadeka A, PMHNP   650 mg at 10/21/23 2118   alum & mag hydroxide-simeth (MAALOX/MYLANTA) 200-200-20 MG/5ML suspension 30 mL  30 mL Oral Q4H PRN Motley-Mangrum, Jadeka A, PMHNP       atorvastatin   (LIPITOR) tablet 20 mg  20 mg Oral Daily Motley-Mangrum, Jadeka A, PMHNP   20 mg at 10/22/23 0825   haloperidol  (HALDOL ) tablet 5 mg  5 mg Oral TID PRN Motley-Mangrum, Jadeka A, PMHNP       And   diphenhydrAMINE  (BENADRYL ) capsule 50 mg  50 mg Oral TID PRN Motley-Mangrum, Jadeka A, PMHNP       haloperidol  lactate (HALDOL ) injection 5 mg  5 mg Intramuscular TID PRN Motley-Mangrum, Jadeka A, PMHNP       And   diphenhydrAMINE  (BENADRYL ) injection 50 mg  50 mg Intramuscular TID PRN Motley-Mangrum, Jadeka A, PMHNP       And   LORazepam  (ATIVAN ) injection 2 mg  2 mg Intramuscular TID PRN Motley-Mangrum, Jadeka A, PMHNP       haloperidol  lactate (HALDOL ) injection 10 mg  10 mg Intramuscular TID PRN Motley-Mangrum, Jadeka A, PMHNP       And   diphenhydrAMINE  (BENADRYL ) injection 50 mg  50 mg Intramuscular TID PRN Motley-Mangrum, Jadeka A, PMHNP       And   LORazepam  (ATIVAN ) injection 2 mg  2 mg Intramuscular TID PRN Motley-Mangrum, Jadeka A, PMHNP       fesoterodine  (TOVIAZ ) tablet 4 mg  4 mg Oral Daily Motley-Mangrum, Jadeka A, PMHNP   4 mg at 10/22/23 0825   magnesium  hydroxide (MILK OF MAGNESIA) suspension 30 mL  30 mL Oral Daily  PRN Motley-Mangrum, Jadeka A, PMHNP       QUEtiapine  (SEROQUEL ) tablet 25 mg  25 mg Oral TID PRN Augusta Blizzard, MD       Cecily Cohen ON 10/23/2023] sertraline  (ZOLOFT ) tablet 200 mg  200 mg Oral Daily Augusta Blizzard, MD       traZODone  (DESYREL ) tablet 50 mg  50 mg Oral QHS Augusta Blizzard, MD        PTA Medications: Medications Prior to Admission  Medication Sig Dispense Refill Last Dose/Taking   atorvastatin  (LIPITOR) 20 MG tablet Take 1 tablet (20 mg total) by mouth daily. 30 tablet 0    hydrOXYzine  (VISTARIL ) 100 MG capsule Take 1 capsule (100 mg total) by mouth every 6 (six) hours as needed. 30 capsule 2    ibuprofen  (ADVIL ) 200 MG tablet Take 200 mg by mouth daily as needed for mild pain (pain score 1-3) or moderate pain (pain score 4-6).      potassium & sodium phosphates   (PHOS-NAK) 280-160-250 MG PACK Take 1 packet by mouth 3 (three) times daily. (Patient not taking: Reported on 10/21/2023) 6 each 0    risperiDONE  (RISPERDAL ) 1 MG tablet Take 1 tablet (1 mg total) by mouth at bedtime. 30 tablet 0    sertraline  (ZOLOFT ) 100 MG tablet Take 1.5 tablets (150 mg total) by mouth daily. 45 tablet 0    solifenacin  (VESICARE ) 10 MG tablet Take 1 tablet (10 mg total) by mouth daily. (Patient taking differently: Take 5 mg by mouth daily.) 30 tablet 11     Physical Findings: AIMS: No  CIWA:    COWS:     Psychiatric Specialty Exam: General Appearance: Appears stated age, tired, but well-nourished  Eye Contact: Makes fair eye contact and cooperative with interview  Speech: Regular rate and rhythm  Volume: Normal volume  Mood: "depressed"  Affect: Flat and mood congruent  Thought Content: Impaired Thought Perceptions: Hallucinations and paranoia  Suicidal Thoughts: Denied SI  Homicidal Thoughts: Denied  Thought Process: Coherent and goal direceted  Orientation: Alert and Oriented x 4 to person, place, time, and situation    Memory: fair  Judgment: fair  Insight: fair  Concentration: fair  Recall: [employer] of Knowledge: Fair  Language: Fair  Psychomotor Activity: Mild Psychomotor agitation with skin picking  Assets: Manufacturing systems engineer; Social Support; Housing; Leisure Time   Sleep: Reported sleeping poorly   Review of Systems Review of Systems  Constitutional:  Positive for chills, diaphoresis and weight loss.  HENT:  Negative for hearing loss and sore throat.   Eyes:  Positive for blurred vision.  Respiratory:  Positive for shortness of breath. Negative for cough and wheezing.   Cardiovascular:  Positive for palpitations. Negative for chest pain.  Gastrointestinal:  Positive for nausea. Negative for constipation, diarrhea and vomiting.  Genitourinary:  Positive for frequency.  Musculoskeletal:  Positive for neck pain.  Neurological:  Positive for  dizziness and headaches.  Psychiatric/Behavioral:  Positive for depression and hallucinations. Negative for substance abuse and suicidal ideas. The patient is nervous/anxious.     Vital signs: Blood pressure (!) 127/90, pulse 95, temperature 98.1 F (36.7 C), temperature source Oral, resp. rate 18, SpO2 98%. There is no height or weight on file to calculate BMI.  Physical Exam Eyes:     Conjunctiva/sclera: Conjunctivae normal.  Pulmonary:     Effort: Pulmonary effort is normal.  Musculoskeletal:        General: Normal range of motion.     Cervical back: Normal range of  motion.  Neurological:     Mental Status: She is alert and oriented to person, place, and time.     Assets  Assets:Communication Skills; Social Support; Housing; Leisure Time   Treatment Plan Summary: Daily contact with patient to assess and evaluate symptoms and progress in treatment and medication management  ASSESSMENT: Maria Blackburn is a 53 y.o. female with a past psychiatric history of major depressive disorder and generalized anxiety disorder admitted voluntarily for management of recrudescence of psychotic symptoms following discharge from [facility] last hospitalization.  It is unclear the etiology of her psychosis at this time given the abrupt return of her psychosis and how they quickly subsided upon returning to the behavioral health hospital.  Unclear if there is some issue with environmental etiology that may be contributing to her symptoms.  Other consideration is whether there is some iatrogenic cause of her psychosis including anticholinergics induced (on solifenacin  for overactive bladder), UTI, or steroid-induced psychosis.  Given patient had prolonged QTc upon arrival to [facility], ED, this was likely secondary to taking more risperidone  than she should have as well as approximate 300 mg total of hydroxyzine  that day.  For now, we will repeat an EKG tomorrow to evaluate if QTc improved. Other considerations  for psychotic symptoms would be if symptoms are more similar to PTSD flashbacks or hallucinations secondary to poor sleep.  MDD with psychotic features is also another consideration although it seems she also has underlying prolonged grief disorder.  Holding risperidone  for now given patient reports she is no longer experiencing any psychotic symptoms after sleeping well.    DIAGNOSIS: Acute Psychosis PTSD MDD -severe, recurrent episode with psychotic features  PLAN: Safety and Monitoring:  -- Voluntary admission to inpatient psychiatric unit for safety, stabilization and treatment  -- Daily contact with patient to assess and evaluate symptoms and progress in treatment  -- Patient's case to be discussed in multi-disciplinary team meeting  -- Observation Level : q15 minute checks  -- Vital signs: q12 hours  -- Precautions: suicide, elopement, and assault  2. Interventions (medications, psychoeducation, etc):  --Hold risperidone  due to prolonged Qtc --Hold hydroxyzine  due to prolonged QTc -- Increase sertraline  to 200 mg daily --Start quetiapine  25 mg twice daily as needed for anxiety -- Schedule trazodone  50 mg nightly  -- continue fesoterodine  4 mg daily (in lieu of home vesicare ) --Continue Lipitor 20 mg daily --d/c potassium & sodium phosphates  as patient only needed to take for 3 days  -repeat bmp and phosphorus --repeat CBC and iron/TIBC, reticulocyte, and ferritin for mild anemia (possibly dilutional)  PRN medications for symptomatic management:              -- start acetaminophen  650 mg every 6 hours as needed for mild to moderate pain, fever, and headaches              -- start hydroxyzine  25 mg three times a day as needed for anxiety              -- start bismuth subsalicylate 524 mg oral chewable tablet every 3 hours as needed for indigestion              -- start senna 8.6 mg oral at bedtime as needed and polyethylene glycol 17 g oral daily as needed for mild to moderate  constipation              -- start ondansetron  8 mg every 8 hours as needed for nausea or vomiting              --  start aluminum-magnesium  hydroxide + simethicone 30 mL every 4 hours as needed for heartburn              -- start trazodone  50 mg at bedtime as needed for insomnia   -- As needed agitation protocol in-place  Consultations:  -- Consult endocrinology for hyperthyroidism findings in patient  Imaging:  The risks/benefits/side-effects/alternatives to the above medication were discussed in detail with the patient and time was given for questions. The patient consents to medication trial. FDA black box warnings, if present, were discussed.  The patient is agreeable with the medication plan, as above. We will monitor the patient's response to pharmacologic treatment, and adjust medications as necessary.  3. Routine and other pertinent labs: EKG monitoring: QTc: 435  Metabolism / endocrine: BMI: There is no height or weight on file to calculate BMI. Prolactin: No results found for: "PROLACTIN" Lipid Panel: Lab Results  Component Value Date   CHOL 247 (H) 08/12/2023   TRIG 135 08/12/2023   HDL 54 08/12/2023   CHOLHDL 4.6 (H) 08/12/2023   VLDL 18 03/20/2016   LDLCALC 169 (H) 08/12/2023   LDLCALC 147 (H) 07/07/2023   HbgA1c: Hgb A1c MFr Bld (%)  Date Value  07/07/2023 5.9 (H)   TSH: TSH (uIU/mL)  Date Value  10/05/2023 0.964  07/07/2023 0.915    Drugs of Abuse     Component Value Date/Time   LABOPIA NONE DETECTED 10/20/2023 2113   COCAINSCRNUR NONE DETECTED 10/20/2023 2113   LABBENZ NONE DETECTED 10/20/2023 2113   AMPHETMU NONE DETECTED 10/20/2023 2113   THCU NONE DETECTED 10/20/2023 2113   LABBARB NONE DETECTED 10/20/2023 2113     4. Group Therapy:  -- Encouraged patient to participate in unit milieu and in scheduled group therapies   -- Short Term Goals: Ability to identify changes in lifestyle to reduce recurrence of condition, verbalize feelings,  identify and develop effective coping behaviors, maintain clinical measurements within normal limits, and identify triggers associated with substance abuse/mental health issues will improve. Improvement in ability to disclose and discuss suicidal ideas, demonstrate self-control, and comply with prescribed medications.  -- Long Term Goals: Improvement in symptoms so as ready for discharge -- Patient is encouraged to participate in group therapy while admitted to the psychiatric unit. -- We will address other chronic and acute stressors, which contributed to the patient's MDD (major depressive disorder), recurrent, severe, with psychosis (HCC) in order to reduce the risk of self-harm at discharge.  5. Discharge Planning:   -- Social work and case management to assist with discharge planning and identification of hospital follow-up needs prior to discharge  -- Estimated LOS: 3-5 days  -- Discharge Concerns: Need to establish a safety plan; Medication compliance and effectiveness  -- Discharge Goals: Return home with outpatient referrals for mental health follow-up including medication management/psychotherapy  I certify that inpatient services furnished can reasonably be expected to improve the patient's condition.   Signed: Augusta Blizzard, MD 10/22/2023, 3:29 PM

## 2023-10-22 NOTE — Plan of Care (Signed)
   Problem: Education: Goal: Emotional status will improve Outcome: Progressing   Problem: Activity: Goal: Interest or engagement in activities will improve Outcome: Progressing

## 2023-10-22 NOTE — Progress Notes (Signed)
   10/22/23 1200  Psych Admission Type (Psych Patients Only)  Admission Status Voluntary  Psychosocial Assessment  Patient Complaints Anxiety  Eye Contact Fair  Facial Expression Anxious  Affect Appropriate to circumstance  Speech Logical/coherent  Interaction Assertive  Motor Activity Other (Comment) (steady gait)  Appearance/Hygiene Unremarkable  Behavior Characteristics Cooperative;Appropriate to situation  Mood Anxious;Pleasant  Thought Process  Coherency WDL  Content WDL  Delusions None reported or observed  Perception WDL  Hallucination None reported or observed  Judgment WDL  Confusion None  Danger to Self  Current suicidal ideation? Denies  Danger to Others  Danger to Others None reported or observed

## 2023-10-22 NOTE — Progress Notes (Signed)
   10/22/23 0400  Psych Admission Type (Psych Patients Only)  Admission Status Voluntary  Psychosocial Assessment  Patient Complaints Anxiety  Eye Contact Fair  Facial Expression Anxious  Affect Anxious  Speech Logical/coherent  Interaction Assertive  Motor Activity Other (Comment) (WNL)  Appearance/Hygiene Unremarkable  Behavior Characteristics Appropriate to situation  Mood Anxious  Thought Process  Coherency WDL  Content WDL  Delusions None reported or observed  Perception WDL  Hallucination None reported or observed  Judgment WDL  Confusion None  Danger to Self  Current suicidal ideation? Denies  Description of Suicide Plan None  Agreement Not to Harm Self Yes  Description of Agreement Verbal Contract for Safety  Danger to Others  Danger to Others None reported or observed

## 2023-10-22 NOTE — Group Note (Signed)
  LCSW Group Therapy Note  Group Date: 10/22/2023 Start Time: 10:00am End Time: 11:10am  Group Therapy:   Anger Management Skills  Participation Level:  Active  Description of Group:   In this group, patients learned how to recognize the physical, cognitive, emotional, and behavioral responses they have to anger-provoking situations.  They identified a recent time they became angry and how they reacted.  They analyzed how their reaction was possibly beneficial and how it was possibly unhelpful.  The group discussed a variety of healthier coping skills that could help with such a situation in the future.  Focus was placed on how helpful it is to recognize the underlying emotions to our anger, because working on those can lead to a more permanent solution as well as our ability to focus on the important rather than the urgent.  The group explored the issue of boundaries and long-term resentments as related to their anger.  CSW taught briefly about states of mind and reviewed TIPP skills, practicing a couple of these with the group as a whole.   Therapeutic Goals: Patients will identify someone/something that typically triggers them to anger as well as their typical coping skill, whether healthy or unhealthy. Patients will learn that anger is a secondary emotion and will see how working on the primary emotion is beneficial in the long-term to deal with angry reactions. Patients will learn basic CBT concept of Thought-Feeling-Action sequence and how helpful it is to challenge the thought in order to have a different feeling and behavior. Patients will be introduced to DBT concept of Emotional/Rational/Wise Mind and how to use TIPP skills to rapidly exit emotional state. Patients will explore possible new behaviors to use in future anger situations.   Summary of Patient Progress:  Patient was active during the group. Patient was not present at the beginning of the group so did not share about things  that make her angry or what she typically does in response.  She demonstrated good insight into the subject matter, was respectful of peers, and participated fully once she joined the group.  At the conclusion of group, patient shared a willingness to try boundaries and the use of ice on her face/neck in the future to improve her anger management.  Therapeutic Modalities:   Cognitive Behavioral Therapy Dialectical Behavioral Therapy Processing  Ancel Kass, LCSW 10/22/2023  12:50 PM

## 2023-10-22 NOTE — Plan of Care (Signed)
  Problem: Education: Goal: Knowledge of [facility] Education information/materials will improve 10/22/2023 0441 by Juanda Noon, RN Outcome: Progressing 10/22/2023 0439 by Juanda Noon, RN Outcome: Progressing Goal: Emotional status will improve 10/22/2023 0441 by Juanda Noon, RN Outcome: Progressing 10/22/2023 0439 by Juanda Noon, RN Outcome: Progressing Goal: Mental status will improve 10/22/2023 0441 by Juanda Noon, RN Outcome: Progressing 10/22/2023 0439 by Juanda Noon, RN Outcome: Progressing Goal: Verbalization of understanding the information provided will improve 10/22/2023 0441 by Juanda Noon, RN Outcome: Progressing 10/22/2023 0439 by Juanda Noon, RN Outcome: Progressing
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
## 2023-10-22 NOTE — Plan of Care (Signed)
   Problem: Education: Goal: Knowledge of [facility] Education information/materials will improve Outcome: Progressing Goal: Emotional status will improve Outcome: Progressing Goal: Mental status will improve Outcome: Progressing Goal: Verbalization of understanding the information provided will improve Outcome: Progressing

## 2023-10-22 NOTE — Progress Notes (Signed)
   10/22/23 2200  Psych Admission Type (Psych Patients Only)  Admission Status Voluntary  Psychosocial Assessment  Patient Complaints Anxiety  Eye Contact Fair  Facial Expression Anxious  Affect Anxious  Speech Logical/coherent  Interaction Assertive  Motor Activity Other (Comment) (WNL)  Appearance/Hygiene Unremarkable  Behavior Characteristics Cooperative  Mood Pleasant  Thought Process  Coherency WDL  Content WDL  Delusions None reported or observed  Perception WDL  Hallucination None reported or observed  Judgment WDL  Confusion None  Danger to Self  Current suicidal ideation? Denies  Description of Suicide Plan None  Agreement Not to Harm Self Yes  Description of Agreement Verbal Contract for Safety  Danger to Others  Danger to Others None reported or observed

## 2023-10-22 NOTE — Plan of Care (Signed)
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
## 2023-10-23 DIAGNOSIS — Z90710 Acquired absence of both cervix and uterus: Secondary | ICD-10-CM | POA: Diagnosis not present

## 2023-10-23 DIAGNOSIS — F4310 Post-traumatic stress disorder, unspecified: Secondary | ICD-10-CM | POA: Diagnosis not present

## 2023-10-23 DIAGNOSIS — Z560 Unemployment, unspecified: Secondary | ICD-10-CM | POA: Diagnosis not present

## 2023-10-23 DIAGNOSIS — F333 Major depressive disorder, recurrent, severe with psychotic symptoms: Secondary | ICD-10-CM

## 2023-10-23 DIAGNOSIS — E876 Hypokalemia: Secondary | ICD-10-CM | POA: Diagnosis not present

## 2023-10-23 DIAGNOSIS — Z833 Family history of diabetes mellitus: Secondary | ICD-10-CM | POA: Diagnosis not present

## 2023-10-23 DIAGNOSIS — Z825 Family history of asthma and other chronic lower respiratory diseases: Secondary | ICD-10-CM | POA: Diagnosis not present

## 2023-10-23 DIAGNOSIS — Z79899 Other long term (current) drug therapy: Secondary | ICD-10-CM | POA: Diagnosis not present

## 2023-10-23 DIAGNOSIS — F4381 Prolonged grief disorder: Secondary | ICD-10-CM | POA: Diagnosis not present

## 2023-10-23 DIAGNOSIS — Z803 Family history of malignant neoplasm of breast: Secondary | ICD-10-CM | POA: Diagnosis not present

## 2023-10-23 DIAGNOSIS — Z8249 Family history of ischemic heart disease and other diseases of the circulatory system: Secondary | ICD-10-CM | POA: Diagnosis not present

## 2023-10-23 LAB — BASIC METABOLIC PANEL WITH GFR
Anion gap: 8 (ref 5–15)
BUN: 10 mg/dL (ref 6–20)
CO2: 26 mmol/L (ref 22–32)
Calcium: 8.9 mg/dL (ref 8.9–10.3)
Chloride: 106 mmol/L (ref 98–111)
Creatinine, Ser: 0.7 mg/dL (ref 0.44–1.00)
GFR, Estimated: >60 mL/min (ref >60)
Glucose, Bld: 114 mg/dL — ABNORMAL HIGH (ref 70–99)
Potassium: 4 mmol/L (ref 3.5–5.1)
Sodium: 140 mmol/L (ref 135–145)

## 2023-10-23 LAB — RETICULOCYTES
Immature Retic Fract: 10.3 % (ref 2.3–15.9)
RBC.: 4.68 MIL/uL (ref 3.87–5.11)
Retic Count, Absolute: 95.9 10*3/uL (ref 19.0–186.0)
Retic Ct Pct: 2.1 % (ref 0.4–3.1)

## 2023-10-23 LAB — IRON AND TIBC
Iron: 55 ug/dL (ref 28–170)
Saturation Ratios: 18 % (ref 10.4–31.8)
TIBC: 315 ug/dL (ref 250–450)
UIBC: 260 ug/dL

## 2023-10-23 LAB — PHOSPHORUS: Phosphorus: 3.9 mg/dL (ref 2.5–4.6)

## 2023-10-23 LAB — FERRITIN: Ferritin: 52 ng/mL (ref 11–307)

## 2023-10-23 NOTE — Progress Notes (Signed)
 [facility] MD Progress Note  10/23/2023 11:06 AM Maria Blackburn  MRN:  [id number]  Principal Problem: MDD (major depressive disorder), recurrent, severe, with psychosis (HCC) Diagnosis: Principal Problem:   MDD (major depressive disorder), recurrent, severe, with psychosis (HCC) Active Problems:   Acute psychosis (HCC)   PTSD (post-traumatic stress disorder)   Reason for Admission:  Maria Blackburn is a 53 y.o. female with a past psychiatric history of major depressive disorder and generalized anxiety disorder admitted voluntarily to the [facility] from [facility] for evaluation and management of auditory hallucinations and paranoia which she had attempted to take 6 tablets of 1 mg risperidone  and 3 tablets of 100 mg hydroxyzine .  (admitted on 10/21/2023, total  LOS: 2 days )  Chart Review from last 24 hours:  The patient's chart was reviewed and nursing notes were reviewed. The patient's case was discussed in multidisciplinary team meeting.   - Overnight events to report per chart review / staff report: no notable overnight events to report - Patient received all scheduled medications - Patient did not receive any PRN medications  Information Obtained Today During Patient Interview: The patient was seen and evaluated on the unit. On assessment today the patient reports doing better than she was yesterday.  She reports that her mood continues to improve and she is not experiencing any psychotic symptoms or flashbacks.  She reports eating and sleeping well.  She denies any acute somatic complaints with current medication regiment or increase of sertraline  at this time.  She denies SI/HI/AVH.  She reports continuing to learn coping skills and setting boundaries and self-care.  Collateral: Attempted to reach Mr. Arline Ketter @ [phone] but no one answered  Past Psychiatric History:  Previous psych diagnoses:  Patient reported history of depressive symptoms  since 1999, officially diagnosed with major depressive disorder in 2016. Patient also reported a diagnosis of generalized anxiety disorder and panic disorder in 2007. Prior inpatient psychiatric treatment: Denies Prior outpatient psychiatric treatment: Denies Current psychiatric provider: Denies  Past Medical History:  Past Medical History:  Diagnosis Date   Anxiety    Bladder spasms    Frequency of urination    GERD (gastroesophageal reflux disease)    Interstitial cystitis    Pelvic pain    SUI (stress urinary incontinence, female)    Urgency of urination     Family Psychiatric History: see H&P Social History: see H&P  Current Medications: Current Facility-Administered Medications  Medication Dose Route Frequency Provider Last Rate Last Admin   acetaminophen  (TYLENOL ) tablet 650 mg  650 mg Oral Q6H PRN Motley-Mangrum, Jadeka A, PMHNP   650 mg at 10/21/23 2118   alum & mag hydroxide-simeth (MAALOX/MYLANTA) 200-200-20 MG/5ML suspension 30 mL  30 mL Oral Q4H PRN Motley-Mangrum, Jadeka A, PMHNP       atorvastatin  (LIPITOR) tablet 20 mg  20 mg Oral Daily Motley-Mangrum, Jadeka A, PMHNP   20 mg at 10/23/23 0800   haloperidol  (HALDOL ) tablet 5 mg  5 mg Oral TID PRN Motley-Mangrum, Jadeka A, PMHNP       And   diphenhydrAMINE  (BENADRYL ) capsule 50 mg  50 mg Oral TID PRN Motley-Mangrum, Jadeka A, PMHNP       haloperidol  lactate (HALDOL ) injection 5 mg  5 mg Intramuscular TID PRN Motley-Mangrum, Jadeka A, PMHNP       And   diphenhydrAMINE  (BENADRYL ) injection 50 mg  50 mg Intramuscular TID PRN Motley-Mangrum, Jadeka A, PMHNP       And  LORazepam  (ATIVAN ) injection 2 mg  2 mg Intramuscular TID PRN Motley-Mangrum, Jadeka A, PMHNP       haloperidol  lactate (HALDOL ) injection 10 mg  10 mg Intramuscular TID PRN Motley-Mangrum, Jadeka A, PMHNP       And   diphenhydrAMINE  (BENADRYL ) injection 50 mg  50 mg Intramuscular TID PRN Motley-Mangrum, Jadeka A, PMHNP       And   LORazepam  (ATIVAN )  injection 2 mg  2 mg Intramuscular TID PRN Motley-Mangrum, Jadeka A, PMHNP       fesoterodine  (TOVIAZ ) tablet 4 mg  4 mg Oral Daily Motley-Mangrum, Jadeka A, PMHNP   4 mg at 10/23/23 0800   magnesium  hydroxide (MILK OF MAGNESIA) suspension 30 mL  30 mL Oral Daily PRN Motley-Mangrum, Jadeka A, PMHNP       QUEtiapine  (SEROQUEL ) tablet 25 mg  25 mg Oral TID PRN Augusta Blizzard, MD       sertraline  (ZOLOFT ) tablet 200 mg  200 mg Oral Daily Chaunte Hornbeck, MD   200 mg at 10/23/23 0800   traZODone  (DESYREL ) tablet 50 mg  50 mg Oral QHS Mariabelen Pressly, MD   50 mg at 10/22/23 2122    Lab Results:  Results for orders placed or performed during the hospital encounter of 10/21/23 (from the past 48 hours)  Urinalysis, Routine w reflex microscopic -Urine, Clean Catch     Status: Abnormal   Collection Time: 10/22/23  3:08 PM  Result Value Ref Range   Color, Urine STRAW (A) YELLOW   APPearance CLEAR CLEAR   Specific Gravity, Urine 1.006 1.005 - 1.030   pH 7.0 5.0 - 8.0   Glucose, UA NEGATIVE NEGATIVE mg/dL   Hgb urine dipstick NEGATIVE NEGATIVE   Bilirubin Urine NEGATIVE NEGATIVE   Ketones, ur NEGATIVE NEGATIVE mg/dL   Protein, ur NEGATIVE NEGATIVE mg/dL   Nitrite NEGATIVE NEGATIVE   Leukocytes,Ua NEGATIVE NEGATIVE    Comment: Performed at [facility], [address], [place], [place] [address]  Basic metabolic panel     Status: Abnormal   Collection Time: 10/23/23  6:33 AM  Result Value Ref Range   Sodium 140 135 - 145 mmol/L   Potassium 4.0 3.5 - 5.1 mmol/L   Chloride 106 98 - 111 mmol/L   CO2 26 22 - 32 mmol/L   Glucose, Bld 114 (H) 70 - 99 mg/dL    Comment: Glucose reference range applies only to samples taken after fasting for at least 8 hours.   BUN 10 6 - 20 mg/dL   Creatinine, Ser 6.04 0.44 - 1.00 mg/dL   Calcium  8.9 8.9 - 10.3 mg/dL   GFR, Estimated >54 >09 mL/min    Comment: (NOTE) Calculated using the CKD-EPI Creatinine Equation (2021)    Anion gap 8 5 - 15    Comment:  Performed at [facility], [address] [place], [place] [address]  Iron and TIBC     Status: None   Collection Time: 10/23/23  6:33 AM  Result Value Ref Range   Iron 55 28 - 170 ug/dL   TIBC 478 295 - 621 ug/dL   Saturation Ratios 18 10.4 - 31.8 %   UIBC 260 ug/dL    Comment: Performed at [facility], [address], [place], [place] [address]  Ferritin     Status: None   Collection Time: 10/23/23  6:33 AM  Result Value Ref Range   Ferritin 52 11 - 307 ng/mL    Comment: Performed  at [facility], [address], [place], [place] [address]  Reticulocytes     Status: None   Collection Time: 10/23/23  6:33 AM  Result Value Ref Range   Retic Ct Pct 2.1 0.4 - 3.1 %   RBC. 4.68 3.87 - 5.11 MIL/uL   Retic Count, Absolute 95.9 19.0 - 186.0 K/uL   Immature Retic Fract 10.3 2.3 - 15.9 %    Comment: Performed at [facility], [address], [place], [place] [address]  Phosphorus     Status: None   Collection Time: 10/23/23  6:33 AM  Result Value Ref Range   Phosphorus 3.9 2.5 - 4.6 mg/dL    Comment: Performed at [facility], [address] [place], [place] [address]    Blood Alcohol level:  Lab Results  Component Value Date   [facility] <10 10/20/2023   ETH <10 09/29/2023    Metabolic Labs: Lab Results  Component Value Date   HGBA1C 5.9 (H) 07/07/2023   No results found for: "PROLACTIN" Lab Results  Component Value Date   CHOL 247 (H) 08/12/2023   TRIG 135 08/12/2023   HDL 54 08/12/2023   CHOLHDL 4.6 (H) 08/12/2023   VLDL 18 03/20/2016   LDLCALC 169 (H) 08/12/2023   LDLCALC 147 (H) 07/07/2023    Physical Findings: AIMS: No  CIWA:    COWS:     Psychiatric Specialty Exam: General Appearance: Appropriate for Environment; Casual   Eye Contact: Good   Speech: Clear and Coherent; Normal Rate   Volume: Normal   Mood: Depressed; Anxious   Affect: Appropriate; Congruent    Thought Content: Logical   Suicidal Thoughts: Suicidal Thoughts: No   Homicidal Thoughts: Homicidal Thoughts: No   Thought Process: Coherent; Goal Directed; Linear   Orientation: Full (Time, Place and Person)     Memory: Immediate Good; Recent Good; Remote Good   Judgment: Fair   Insight: Fair   Concentration: Good   Recall: Good   Fund of Knowledge: Good   Language: Good   Psychomotor Activity: Psychomotor Activity: Normal   Assets: Communication Skills; Desire for Improvement; Physical Health   Sleep: Sleep: Good    Review of Systems ROS  Vital Signs: Blood pressure (!) 140/89, pulse 98, temperature 98.4 F (36.9 C), temperature source Oral, resp. rate 16, SpO2 96%. There is no height or weight on file to calculate BMI. Physical Exam  Assets  Assets: Communication Skills; Desire for Improvement; Physical Health   Treatment Plan Summary: Daily contact with patient to assess and evaluate symptoms and progress in treatment and Medication management  Diagnoses / Active Problems: MDD (major depressive disorder), recurrent, severe, with psychosis (HCC) Principal Problem:   MDD (major depressive disorder), recurrent, severe, with psychosis (HCC) Active Problems:   Acute psychosis (HCC)   PTSD (post-traumatic stress disorder)   ASSESSMENT: Patient experiencing improvement in depression and anxiety.  She has not experienced any psychotic symptoms since day before yesterday.  No problems with increase in sertraline .  Things continued doing well likely discharge on 10/25/2023.  She may not require antipsychotic given not experiencing psychotic symptoms.  Repeating EKG to recheck that prolonged QTc.  It seems that environmental or poor sleep may have caused a lot of her symptoms.   PLAN: Safety and Monitoring:             -- Voluntary admission to inpatient psychiatric unit for safety, stabilization and treatment             --  Daily contact with patient to  assess and evaluate symptoms and progress in treatment             -- Patient's case to be discussed in multi-disciplinary team meeting             -- Observation Level : q15 minute checks             -- Vital signs: q12 hours             -- Precautions: suicide, elopement, and assault   2. Interventions (medications, psychoeducation, etc):  -- Hold risperidone  due to prolonged Qtc -- Hold hydroxyzine  due to prolonged QTc -- Continue sertraline  200 mg daily -- Continue quetiapine  25 mg twice daily as needed for anxiety -- Continue trazodone  50 mg nightly -- continue fesoterodine  4 mg daily (in lieu of home vesicare ) --Continue Lipitor 20 mg daily --d/c potassium & sodium phosphates  as patient only needed to take for 3 days             -bmp and phosphorus wnl --CBC and iron/TIBC, reticulocyte, and ferritin wnl --UA wnl   PRN medications for symptomatic management:              -- continue acetaminophen  650 mg every 6 hours as needed for mild to moderate pain, fever, and headaches              -- continue hydroxyzine  25 mg three times a day as needed for anxiety              -- continue bismuth subsalicylate 524 mg oral chewable tablet every 3 hours as needed for indigestion              -- continue senna 8.6 mg oral at bedtime as needed and polyethylene glycol 17 g oral daily as needed for mild to moderate constipation              -- continue ondansetron  8 mg every 8 hours as needed for nausea or vomiting              -- continue aluminum-magnesium  hydroxide + simethicone 30 mL every 4 hours as needed for heartburn  -- As needed agitation protocol in-place  The risks/benefits/side-effects/alternatives to the above medication were discussed in detail with the patient and time was given for questions. The patient consents to medication trial. FDA black box warnings, if present, were discussed.  The patient is agreeable with the medication plan, as above. We will monitor the patient's  response to pharmacologic treatment, and adjust medications as necessary.  3. Routine and other pertinent labs:             -- Metabolic profile:  BMI: There is no height or weight on file to calculate BMI.  Prolactin: No results found for: "PROLACTIN"  Lipid Panel: Lab Results  Component Value Date   CHOL 247 (H) 08/12/2023   TRIG 135 08/12/2023   HDL 54 08/12/2023   CHOLHDL 4.6 (H) 08/12/2023   VLDL 18 03/20/2016   LDLCALC 169 (H) 08/12/2023   LDLCALC 147 (H) 07/07/2023    HbgA1c: Hgb A1c MFr Bld (%)  Date Value  07/07/2023 5.9 (H)    TSH: TSH (uIU/mL)  Date Value  10/05/2023 0.964  07/07/2023 0.915    EKG monitoring: QTc: 499, new ECG pending  4. Group Therapy:  -- Encouraged patient to participate in unit milieu and in scheduled group therapies   -- Short Term Goals: Ability to identify  changes in lifestyle to reduce recurrence of condition, verbalize feelings, identify and develop effective coping behaviors, maintain clinical measurements within normal limits, and identify triggers associated with substance abuse/mental health issues will improve. Improvement in ability to demonstrate self-control and comply with prescribed medications.  -- Long Term Goals: Improvement in symptoms so as ready for discharge -- Patient is encouraged to participate in group therapy while admitted to the psychiatric unit. -- We will address other chronic and acute stressors, which contributed to the patient's MDD (major depressive disorder), recurrent, severe, with psychosis (HCC) in order to reduce the risk of self-harm at discharge.  5. Discharge Planning:   -- Social work and case management to assist with discharge planning and identification of hospital follow-up needs prior to discharge  -- Estimated LOS: 4 days  -- Discharge Concerns: Need to establish a safety plan; Medication compliance and effectiveness  -- Discharge Goals: Return home with outpatient referrals for mental  health follow-up including medication management/psychotherapy  I certify that inpatient services furnished can reasonably be expected to improve the patient's condition.   Signed: Augusta Blizzard, MD 10/23/2023, 11:06 AM

## 2023-10-23 NOTE — Progress Notes (Signed)
   10/23/23 1000  Psych Admission Type (Psych Patients Only)  Admission Status Voluntary  Psychosocial Assessment  Patient Complaints Sadness  Eye Contact Fair  Facial Expression Sad  Affect Appropriate to circumstance  Speech Logical/coherent  Interaction Assertive  Motor Activity Other (Comment) (steady gait)  Appearance/Hygiene Unremarkable  Behavior Characteristics Cooperative;Calm  Mood Pleasant;Sad  Thought Process  Coherency WDL  Content WDL  Delusions None reported or observed  Perception WDL  Hallucination None reported or observed  Judgment WDL  Confusion None  Danger to Self  Current suicidal ideation? Denies  Danger to Others  Danger to Others None reported or observed

## 2023-10-23 NOTE — [facility] Group Notes (Signed)
 [facility] Group Notes:  (Nursing/MHT/Case Management/Adjunct)  Date:  10/23/2023  Time:  2000  Type of Therapy:   Wrap up group  Participation Level:  Active  Participation Quality:  Appropriate, Attentive, Sharing, and Supportive  Affect:  Depressed  Cognitive:  Alert  Insight:  Improving  Engagement in Group:  Engaged  Modes of Intervention:  Clarification, Education, and Support  Summary of Progress/Problems: Positive thinking and positive change were discussed.   Catharine Clock 10/23/2023, 9:13 PM

## 2023-10-23 NOTE — Progress Notes (Signed)
 Adult Psychoeducational Group Note  Date:  10/23/2023 Time:  12:17 AM  Group Topic/Focus:  Wrap-Up Group:   The focus of this group is to help patients review their daily goal of treatment and discuss progress on daily workbooks.  Participation Level:  Active  Participation Quality:  Appropriate  Affect:  Appropriate  Cognitive:  Appropriate  Insight: Appropriate  Engagement in Group:  Engaged  Modes of Intervention:  Discussion  Additional Comments:  Pt stated she had a great day.  Pt goal for the day was to socialize with her peers. Pt met goal.  Aquilla Bayley 10/23/2023, 12:17 AM

## 2023-10-23 NOTE — Group Note (Signed)
 Date:  10/23/2023 Time:  9:07 AM  Group Topic/Focus:  Goals Group:   The focus of this group is to help patients establish daily goals to achieve during treatment and discuss how the patient can incorporate goal setting into their daily lives to aide in recovery. Orientation:   The focus of this group is to educate the patient on the purpose and policies of crisis stabilization and provide a format to answer questions about their admission.  The group details unit policies and expectations of patients while admitted.    Participation Level:  Active  Participation Quality:  Appropriate  Affect:  Appropriate  Cognitive:  Appropriate  Insight: Appropriate  Engagement in Group:  Engaged  Modes of Intervention:  Orientation  Additional Comments:  Goal is to see a doctor  Maria Blackburn 10/23/2023, 9:07 AM

## 2023-10-23 NOTE — Progress Notes (Signed)
 EKG results placed on the patient's shadow chart Normal sinus rhythm Normal ECG  QT/Qtc-Baz  412/444 ms

## 2023-10-23 NOTE — [facility] Suicide Risk Assessment (Signed)
 [facility] INPATIENT:  Family/Significant Other Suicide Prevention Education  Suicide Prevention Education:  Contact Attempts: Shanicqua Coldren, [phone], Husband has been identified by the patient as the family member/significant other with whom the patient will be residing, and identified as the person(s) who will aid the patient in the event of a mental health crisis.  With written consent from the patient, two attempts were made to provide suicide prevention education, prior to and/or following the patient's discharge.  We were unsuccessful in providing suicide prevention education.  A suicide education pamphlet was given to the patient to share with family/significant other.  Date and time of first attempt:10/23/23 at 9:45 AM.   Date and time of second attempt: Second attempt is needed.   Roselle Conner 10/23/2023, 9:58 AM

## 2023-10-23 NOTE — Plan of Care (Signed)
   Problem: Education: Goal: Emotional status will improve Outcome: Progressing   Problem: Activity: Goal: Interest or engagement in activities will improve Outcome: Progressing

## 2023-10-23 NOTE — Group Note (Signed)
  LCSW Group Therapy Note  Group Date: 10/23/2023  Start Time: 10:00am End Time: 11:10am  Group Therapy:   Boundaries  Participation Level:  Minimal  Description of Group: This group was used to explore boundary styles (porous, rigid, healthy), boundary types (physical, emotional, intellectual, sexual, material, time), basics about setting boundaries, possible phrases to use, and guidelines of how to approach someone by planning ahead, being respectful, using confident body language, and compromising.  Handouts were used to further reinforce teaching and to remind patients after hospitalization.  Patients identified current problems with setting boundaries in their own lives and discussed what they have tried to date.  Patients were encouraged to ask questions, express concerns, and bring up specific situations for group discussion.  Humor and role play were used to further healthy discussion on the topic.  Therapeutic Goals:  1.  Patient will identify one areas in their life where setting clear boundaries have been needed but unsuccessful to date.   2.  Patient will learn about boundary styles, boundary types, and setting boundaries. 3.  Patient will be provided with multiple illustrations and examples of healthy boundary setting and how to make corrections when boundaries are violated 4.  Patient will demonstrate ability to recognize the need for boundaries and make a plan on how to set boundaries in vignettes provided.  Summary of Patient Progress:   Patient demonstrated some insight into the subject matter of boundaries, was respectful of peers, and was attentive; however, she left group early and did not return.  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    Therapeutic Modalities:   Psychoeducation Solution-focused Processing   Ancel Kass, LCSW 10/23/2023  12:02 PM

## 2023-10-24 ENCOUNTER — Encounter (HOSPITAL_COMMUNITY): Payer: Self-pay

## 2023-10-24 DIAGNOSIS — F333 Major depressive disorder, recurrent, severe with psychotic symptoms: Secondary | ICD-10-CM | POA: Diagnosis not present

## 2023-10-24 MED ORDER — ARIPIPRAZOLE 10 MG PO TABS
10.0000 mg | ORAL_TABLET | Freq: Every day | ORAL | Status: DC
  Administered 2023-10-25 – 2023-10-27 (×3): 10 mg via ORAL
  Filled 2023-10-24 (×4): qty 1

## 2023-10-24 MED ORDER — ARIPIPRAZOLE 5 MG PO TABS
5.0000 mg | ORAL_TABLET | Freq: Once | ORAL | Status: AC
  Administered 2023-10-24: 5 mg via ORAL
  Filled 2023-10-24: qty 1

## 2023-10-24 NOTE — BH IP Treatment Plan (Signed)
 Interdisciplinary Treatment and Diagnostic Plan Update  10/24/2023 Time of Session: 11:10 AM Maria Blackburn MRN: [id number]  Principal Diagnosis: MDD (major depressive disorder), recurrent, severe, with psychosis (HCC)  Secondary Diagnoses: Principal Problem:   MDD (major depressive disorder), recurrent, severe, with psychosis (HCC) Active Problems:   Acute psychosis (HCC)   PTSD (post-traumatic stress disorder)   Current Medications:  Current Facility-Administered Medications  Medication Dose Route Frequency Provider Last Rate Last Admin   acetaminophen  (TYLENOL ) tablet 650 mg  650 mg Oral Q6H PRN Motley-Mangrum, Jadeka A, PMHNP   650 mg at 10/23/23 2106   alum & mag hydroxide-simeth (MAALOX/MYLANTA) 200-200-20 MG/5ML suspension 30 mL  30 mL Oral Q4H PRN Motley-Mangrum, Jadeka A, PMHNP       [START ON 10/25/2023] ARIPiprazole  (ABILIFY ) tablet 10 mg  10 mg Oral Daily Izediuno, Iline Mallory, MD       ARIPiprazole  (ABILIFY ) tablet 5 mg  5 mg Oral Once Izediuno, Iline Mallory, MD       atorvastatin  (LIPITOR) tablet 20 mg  20 mg Oral Daily Motley-Mangrum, Jadeka A, PMHNP   20 mg at 10/24/23 0802   haloperidol  (HALDOL ) tablet 5 mg  5 mg Oral TID PRN Motley-Mangrum, Jadeka A, PMHNP       And   diphenhydrAMINE  (BENADRYL ) capsule 50 mg  50 mg Oral TID PRN Motley-Mangrum, Jadeka A, PMHNP       haloperidol  lactate (HALDOL ) injection 5 mg  5 mg Intramuscular TID PRN Motley-Mangrum, Jadeka A, PMHNP       And   diphenhydrAMINE  (BENADRYL ) injection 50 mg  50 mg Intramuscular TID PRN Motley-Mangrum, Jadeka A, PMHNP       And   LORazepam  (ATIVAN ) injection 2 mg  2 mg Intramuscular TID PRN Motley-Mangrum, Jadeka A, PMHNP       haloperidol  lactate (HALDOL ) injection 10 mg  10 mg Intramuscular TID PRN Motley-Mangrum, Jadeka A, PMHNP       And   diphenhydrAMINE  (BENADRYL ) injection 50 mg  50 mg Intramuscular TID PRN Motley-Mangrum, Jadeka A, PMHNP       And   LORazepam  (ATIVAN ) injection 2 mg  2 mg  Intramuscular TID PRN Motley-Mangrum, Jadeka A, PMHNP       fesoterodine  (TOVIAZ ) tablet 4 mg  4 mg Oral Daily Motley-Mangrum, Jadeka A, PMHNP   4 mg at 10/24/23 0802   magnesium  hydroxide (MILK OF MAGNESIA) suspension 30 mL  30 mL Oral Daily PRN Motley-Mangrum, Jadeka A, PMHNP       sertraline  (ZOLOFT ) tablet 200 mg  200 mg Oral Daily Ji, Andrew, MD   200 mg at 10/24/23 0802   traZODone  (DESYREL ) tablet 50 mg  50 mg Oral QHS Ji, Andrew, MD   50 mg at 10/23/23 2106   PTA Medications: Medications Prior to Admission  Medication Sig Dispense Refill Last Dose/Taking   atorvastatin  (LIPITOR) 20 MG tablet Take 1 tablet (20 mg total) by mouth daily. 30 tablet 0    hydrOXYzine  (VISTARIL ) 100 MG capsule Take 1 capsule (100 mg total) by mouth every 6 (six) hours as needed. 30 capsule 2    ibuprofen  (ADVIL ) 200 MG tablet Take 200 mg by mouth daily as needed for mild pain (pain score 1-3) or moderate pain (pain score 4-6).      potassium & sodium phosphates  (PHOS-NAK) 280-160-250 MG PACK Take 1 packet by mouth 3 (three) times daily. (Patient not taking: Reported on 10/21/2023) 6 each 0    risperiDONE  (RISPERDAL ) 1 MG tablet Take 1 tablet (1  mg total) by mouth at bedtime. 30 tablet 0    sertraline  (ZOLOFT ) 100 MG tablet Take 1.5 tablets (150 mg total) by mouth daily. 45 tablet 0    solifenacin  (VESICARE ) 10 MG tablet Take 1 tablet (10 mg total) by mouth daily. (Patient taking differently: Take 5 mg by mouth daily.) 30 tablet 11     Patient Stressors: Health problems    Patient Strengths: Ability for insight  Active sense of humor  Average or above average intelligence  Capable of independent living  Arboriculturist fund of knowledge  Motivation for treatment/growth   Treatment Modalities: Medication Management, Group therapy, Case management,  1 to 1 session with clinician, Psychoeducation, Recreational therapy.   Physician Treatment Plan for Primary Diagnosis: MDD  (major depressive disorder), recurrent, severe, with psychosis (HCC) Long Term Goal(s):     Short Term Goals:    Medication Management: Evaluate patient's response, side effects, and tolerance of medication regimen.  Therapeutic Interventions: 1 to 1 sessions, Unit Group sessions and Medication administration.  Evaluation of Outcomes: Not Progressing  Physician Treatment Plan for Secondary Diagnosis: Principal Problem:   MDD (major depressive disorder), recurrent, severe, with psychosis (HCC) Active Problems:   Acute psychosis (HCC)   PTSD (post-traumatic stress disorder)  Long Term Goal(s):     Short Term Goals:       Medication Management: Evaluate patient's response, side effects, and tolerance of medication regimen.  Therapeutic Interventions: 1 to 1 sessions, Unit Group sessions and Medication administration.  Evaluation of Outcomes: Not Progressing   RN Treatment Plan for Primary Diagnosis: MDD (major depressive disorder), recurrent, severe, with psychosis (HCC) Long Term Goal(s): Knowledge of disease and therapeutic regimen to maintain health will improve  Short Term Goals: Ability to remain free from injury will improve, Ability to verbalize frustration and anger appropriately will improve, Ability to demonstrate self-control, Ability to participate in decision making will improve, Ability to verbalize feelings will improve, Ability to disclose and discuss suicidal ideas, Ability to identify and develop effective coping behaviors will improve, and Compliance with prescribed medications will improve  Medication Management: RN will administer medications as ordered by provider, will assess and evaluate patient's response and provide education to patient for prescribed medication. RN will report any adverse and/or side effects to prescribing provider.  Therapeutic Interventions: 1 on 1 counseling sessions, Psychoeducation, Medication administration, Evaluate responses to  treatment, Monitor vital signs and CBGs as ordered, Perform/monitor CIWA, COWS, AIMS and Fall Risk screenings as ordered, Perform wound care treatments as ordered.  Evaluation of Outcomes: Not Progressing   LCSW Treatment Plan for Primary Diagnosis: MDD (major depressive disorder), recurrent, severe, with psychosis (HCC) Long Term Goal(s): Safe transition to appropriate next level of care at discharge, Engage patient in therapeutic group addressing interpersonal concerns.  Short Term Goals: Engage patient in aftercare planning with referrals and resources, Increase social support, Increase ability to appropriately verbalize feelings, Increase emotional regulation, Facilitate acceptance of mental health diagnosis and concerns, Facilitate patient progression through stages of change regarding substance use diagnoses and concerns, Identify triggers associated with mental health/substance abuse issues, and Increase skills for wellness and recovery  Therapeutic Interventions: Assess for all discharge needs, 1 to 1 time with Social worker, Explore available resources and support systems, Assess for adequacy in community support network, Educate family and significant other(s) on suicide prevention, Complete Psychosocial Assessment, Interpersonal group therapy.  Evaluation of Outcomes: Not Progressing   Progress in Treatment: Attending groups: No. Participating in  groups: No. Taking medication as prescribed: Yes. Toleration medication: Yes. Family/Significant other contact made: No, will contact:  Ruweyda Macknight [phone] Patient understands diagnosis: Yes. Discussing patient identified problems/goals with staff: Yes. Medical problems stabilized or resolved: Yes. Denies suicidal/homicidal ideation: Yes. Issues/concerns per patient self-inventory: No.  New problem(s) identified:  No  New Short Term/Long Term Goal(s):   medication stabilization, elimination of SI thoughts, development of  comprehensive mental wellness plan.   Patient Goals:  "The voices I hear aren't real.  I've put everything in the Lord's hands."    Discharge Plan or Barriers:  Patient recently admitted. CSW will continue to follow and assess for appropriate referrals and possible discharge planning.   Reason for Continuation of Hospitalization: Anxiety Depression Hallucinations Suicidal ideation  Estimated Length of Stay:  5 - 7 days  Last 3 [place] Suicide Severity Risk Score: Flowsheet Row Ad[facility] (Current) [facility]20[facility] in [facility] ED from [facility]/20[facility]  C-SSRS RISK CATEGORY No Risk No Risk No Risk       Last PHQ 2/9 Scores:    09/29/2023    4:30 PM 04/04/2023    3:47 PM 02/02/2023    3:29 PM  Depression screen PHQ 2/9  Decreased Interest 1 1 2   Down, Depressed, Hopeless 1 3 2   PHQ - 2 Score 2 4 4   Altered sleeping 2 3 1   Tired, decreased energy 2 3 1   Change in appetite 1 1 1   Feeling bad or failure about yourself  0 0 0  Trouble concentrating 2 1 0  Moving slowly or fidgety/restless 0 0 0  Suicidal thoughts 0 0 0  PHQ-9 Score 9 12 7   Difficult doing work/chores  Somewhat difficult Somewhat difficult    Scribe for Treatment Team: Joaovictor Krone O Kipp Shank, LCSWA 10/24/2023 4:56 PM

## 2023-10-24 NOTE — Progress Notes (Signed)
   10/24/23 0800  Psych Admission Type (Psych Patients Only)  Admission Status Voluntary  Psychosocial Assessment  Patient Complaints Sadness  Eye Contact Fair  Facial Expression Anxious  Affect Anxious  Speech Logical/coherent  Interaction Assertive  Motor Activity Other (Comment) (WNL)  Appearance/Hygiene Unremarkable  Behavior Characteristics Cooperative;Appropriate to situation  Mood Sad  Thought Process  Coherency WDL  Content WDL  Delusions None reported or observed  Perception WDL  Hallucination None reported or observed  Judgment WDL  Confusion None  Danger to Self  Current suicidal ideation? Denies  Agreement Not to Harm Self Yes  Description of Agreement Verbal contract  Danger to Others  Danger to Others None reported or observed

## 2023-10-24 NOTE — Plan of Care (Signed)
  Problem: Activity: Goal: Interest or engagement in activities will improve Outcome: Progressing   Problem: Coping: Goal: Ability to verbalize frustrations and anger appropriately will improve Outcome: Progressing   Problem: Self-Concept: Goal: Ability to identify factors that promote anxiety will improve Outcome: Progressing

## 2023-10-24 NOTE — Plan of Care (Signed)

## 2023-10-24 NOTE — Progress Notes (Signed)
   10/24/23 0500  Psych Admission Type (Psych Patients Only)  Admission Status Voluntary  Psychosocial Assessment  Patient Complaints Sadness  Eye Contact Fair  Facial Expression Anxious  Affect Anxious  Speech Logical/coherent  Interaction Assertive  Motor Activity Other (Comment) (WNL)  Appearance/Hygiene Unremarkable  Behavior Characteristics Cooperative;Appropriate to situation;Calm  Mood Sad  Thought Process  Coherency WDL  Content WDL  Delusions None reported or observed  Perception WDL  Hallucination None reported or observed  Judgment WDL  Confusion None  Danger to Self  Current suicidal ideation? Denies  Description of Suicide Plan None  Agreement Not to Harm Self Yes  Description of Agreement Verbal Contract for Safety  Danger to Others  Danger to Others None reported or observed

## 2023-10-24 NOTE — [facility] Group Notes (Signed)
 [facility] Group Notes:  (Nursing/MHT/Case Management/Adjunct)  Date:  10/24/2023  Time:  9:49 PM  Type of Therapy:  Psychoeducational Skills  Participation Level:  Minimal  Participation Quality:  Appropriate  Affect:  Appropriate  Cognitive:  Alert  Insight:  Limited  Engagement in Group:  Engaged  Modes of Intervention:  Education  Summary of Progress/Problems: Patient attended the evening speakers meeting and was appropriate.  Sheylin Scharnhorst S 10/24/2023, 9:49 PM

## 2023-10-24 NOTE — [facility] Group Notes (Signed)
 Spiritual care group on grief and loss facilitated by Haskell Linker, M.Div.  Group Goal: Support / Education around grief and loss Members engage in facilitated group support and psycho-social education.  Group Description: Following introductions and group rules, group members engaged in facilitated group dialog and support around topic of loss, with particular support around experiences of loss in their lives. Group Identified types of loss (relationships / self / things) and identified patterns, circumstances, and changes that precipitate losses. Reflected on thoughts / feelings around loss, normalized grief responses, and recognized variety in grief experience. Group noted Worden's four tasks of grief in discussion.  Group facilitation based upon theories of Derrell Flight; drawing also upon Adlerian / Rogerian, narrative, MI.  Observations: Ms. Radovich was engaged but primarily participated in passive ways.  Jantzen Pilger L. Minetta Aly, M.Div [phone]

## 2023-10-24 NOTE — Group Note (Signed)
 Recreation Therapy Group Note   Group Topic:Stress Management  Group Date: 10/24/2023 Start Time: 0935 End Time: 1013 Facilitators: Hulan Szumski-McCall, LRT,CTRS Location: [address] Dayroom   Group Topic: Stress Management  Goal Area(s) Addresses:  Patient will identify positive stress management techniques. Patient will identify benefits of using stress management post d/c.  Intervention: Camera operator, Phone  Activity: Music Therapy. LRT and patients discussed the importance of stress management and how music can be used to relieve stress. LRT let patients pick songs that helped them deal with stress, relax or as motivation. Songs chosen by patients had to be clean and appropriate.    Education:  Stress Management, Discharge Planning.   Education Outcome: Acknowledges Education   Affect/Mood: N/A   Participation Level: Did not attend    Clinical Observations/Individualized Feedback:    Plan: Continue to engage patient in RT group sessions 2-3x/week.   Mishawn Didion-McCall, LRT,CTRS 10/24/2023 11:53 AM

## 2023-10-24 NOTE — [facility] Group Notes (Signed)
 Adult Psychoeducational Group Note  Date:  10/24/2023 Time:  9:30 AM  Group Topic/Focus:  Goals Group:   The focus of this group is to help patients establish daily goals to achieve during treatment and discuss how the patient can incorporate goal setting into their daily lives to aide in recovery.  Participation Level:  Active  Participation Quality:  Appropriate  Affect:  Appropriate  Cognitive:  Alert  Insight: Appropriate  Engagement in Group:  Engaged  Modes of Intervention:  Orientation  Additional Comments:  Pt goal for today is to focus on God and be positive  Maria Blackburn 10/24/2023, 9:30 AM

## 2023-10-24 NOTE — Progress Notes (Signed)
 [facility] MD Progress Note  10/24/2023 2:59 PM Maria Blackburn  MRN:  [id number] Subjective:   53 year old Caucasian female, married, lives with her family, takes care of her niece daughter. Background history of MDD.  Patient was recently stabilized on combination of sertraline  and Risperdal .  She was discharged from our unit but represented with residual psychotic symptoms.  Patient was reported to taking 6 mg of risperidone  as well as to get rid of the voices in her head. Routine labs are essentially normal.  No alcohol or any psychoactive substance abroad.  I assumed care of this patient today.  Chart reviewed.  Patient discussed at multidisciplinary team meeting.  Nursing staff reports that patient has been appropriate on the unit.  No challenging behavior.  She has been adherent with her medications.  Seen today.  Patient stated that she was not trying to kill herself.  States that she was trying to feel better and get rid of the voices.  She tells me that she has been taking her medicine as recommended since that discharge.  Patient states that she is not "crazy".  She wants to be normal and enjoy life with her family.  No current suicidal thoughts.  No current homicidal thoughts.  No current thoughts of violence.  She does not feel pervasively down.  She has been tolerating high-dose sertraline  well.  I explored the use of aripiprazole  to target psychosis.  Benefits of using it as a long-acting injectable if she responds to me it was explored.  Patient is receptive to LAI.  Will initiate their medication, I have encouraged her to keep ventilating her finances..    Principal Problem: MDD (major depressive disorder), recurrent, severe, with psychosis (HCC) Diagnosis: Principal Problem:   MDD (major depressive disorder), recurrent, severe, with psychosis (HCC) Active Problems:   Acute psychosis (HCC)   PTSD (post-traumatic stress disorder)  Total Time spent with patient: 30 minutes  Past  Psychiatric History:  See H&P  Past Medical History:  Past Medical History:  Diagnosis Date   Anxiety    Bladder spasms    Frequency of urination    GERD (gastroesophageal reflux disease)    Interstitial cystitis    Pelvic pain    SUI (stress urinary incontinence, female)    Urgency of urination     Past Surgical History:  Procedure Laterality Date   ABDOMINAL HYSTERECTOMY  2000   ovaries remain   CYSTO WITH HYDRODISTENSION N/A 03/24/2017   Procedure: CYSTOSCOPY/HYDRODISTENSION INSTILLATION OF MARCAINE  AND PYRIDIUM ;  Surgeon: Homero Luster, MD;  Location: [facility];  Service: Urology;  Laterality: N/A;   HYSTEROSCOPY WITH D & C  1999   POLYPECTOMY   TUBAL LIGATION Bilateral 1996   Family History:  Family History  Problem Relation Age of Onset   Heart disease Mother    Diabetes Son    Family Psychiatric  History:  See H&P  Social History:  Social History   Substance and Sexual Activity  Alcohol Use No     Social History   Substance and Sexual Activity  Drug Use No    Social History   Socioeconomic History   Marital status: Married    Spouse name: Not on file   Number of children: Not on file   Years of education: Not on file   Highest education level: Not on file  Occupational History   Not on file  Tobacco Use   Smoking status: Never   Smokeless tobacco: Never  Vaping Use  Vaping status: Never Used  Substance and Sexual Activity   Alcohol use: No   Drug use: No   Sexual activity: Yes  Other Topics Concern   Not on file  Social History Narrative   Not on file   Social Drivers of Health   Financial Resource Strain: Low Risk  (03/25/2023)   Overall Financial Resource Strain (CARDIA)    Difficulty of Paying Living Expenses: Not very hard  Food Insecurity: No Food Insecurity (10/21/2023)   Hunger Vital Sign    Worried About Running Out of Food in the Last Year: Never true    Ran Out of Food in the Last Year: Never true   Transportation Needs: No Transportation Needs (10/21/2023)   PRAPARE - Administrator, Civil Service (Medical): No    Lack of Transportation (Non-Medical): No  Physical Activity: Patient Unable To Answer (03/25/2023)   Exercise Vital Sign    Days of Exercise per Week: Patient unable to answer    Minutes of Exercise per Session: Patient unable to answer  Stress: Patient Unable To Answer (03/25/2023)   [employer] of Occupational Health - Occupational Stress Questionnaire    Feeling of Stress : Patient unable to answer  Social Connections: Socially Integrated (10/21/2023)   Social Connection and Isolation Panel [NHANES]    Frequency of Communication with Friends and Family: More than three times a week    Frequency of Social Gatherings with Friends and Family: More than three times a week    Attends Religious Services: More than 4 times per year    Active Member of [employer] or Organizations: Yes    Attends Engineer, structural: More than 4 times per year    Marital Status: Married    Current Medications: Current Facility-Administered Medications  Medication Dose Route Frequency Provider Last Rate Last Admin   acetaminophen  (TYLENOL ) tablet 650 mg  650 mg Oral Q6H PRN Motley-Mangrum, Jadeka A, PMHNP   650 mg at 10/23/23 2106   alum & mag hydroxide-simeth (MAALOX/MYLANTA) 200-200-20 MG/5ML suspension 30 mL  30 mL Oral Q4H PRN Motley-Mangrum, Jadeka A, PMHNP       atorvastatin  (LIPITOR) tablet 20 mg  20 mg Oral Daily Motley-Mangrum, Jadeka A, PMHNP   20 mg at 10/24/23 0802   haloperidol  (HALDOL ) tablet 5 mg  5 mg Oral TID PRN Motley-Mangrum, Jadeka A, PMHNP       And   diphenhydrAMINE  (BENADRYL ) capsule 50 mg  50 mg Oral TID PRN Motley-Mangrum, Jadeka A, PMHNP       haloperidol  lactate (HALDOL ) injection 5 mg  5 mg Intramuscular TID PRN Motley-Mangrum, Jadeka A, PMHNP       And   diphenhydrAMINE  (BENADRYL ) injection 50 mg  50 mg Intramuscular TID PRN Motley-Mangrum,  Jadeka A, PMHNP       And   LORazepam  (ATIVAN ) injection 2 mg  2 mg Intramuscular TID PRN Motley-Mangrum, Jadeka A, PMHNP       haloperidol  lactate (HALDOL ) injection 10 mg  10 mg Intramuscular TID PRN Motley-Mangrum, Jadeka A, PMHNP       And   diphenhydrAMINE  (BENADRYL ) injection 50 mg  50 mg Intramuscular TID PRN Motley-Mangrum, Jadeka A, PMHNP       And   LORazepam  (ATIVAN ) injection 2 mg  2 mg Intramuscular TID PRN Motley-Mangrum, Jadeka A, PMHNP       fesoterodine  (TOVIAZ ) tablet 4 mg  4 mg Oral Daily Motley-Mangrum, Jadeka A, PMHNP   4 mg at 10/24/23 0802  magnesium  hydroxide (MILK OF MAGNESIA) suspension 30 mL  30 mL Oral Daily PRN Motley-Mangrum, Jadeka A, PMHNP       sertraline  (ZOLOFT ) tablet 200 mg  200 mg Oral Daily Ji, Andrew, MD   200 mg at 10/24/23 0802   traZODone  (DESYREL ) tablet 50 mg  50 mg Oral QHS Ji, Andrew, MD   50 mg at 10/23/23 2106    Lab Results:  Results for orders placed or performed during the hospital encounter of 10/21/23 (from the past 48 hours)  Urinalysis, Routine w reflex microscopic -Urine, Clean Catch     Status: Abnormal   Collection Time: 10/22/23  3:08 PM  Result Value Ref Range   Color, Urine STRAW (A) YELLOW   APPearance CLEAR CLEAR   Specific Gravity, Urine 1.006 1.005 - 1.030   pH 7.0 5.0 - 8.0   Glucose, UA NEGATIVE NEGATIVE mg/dL   Hgb urine dipstick NEGATIVE NEGATIVE   Bilirubin Urine NEGATIVE NEGATIVE   Ketones, ur NEGATIVE NEGATIVE mg/dL   Protein, ur NEGATIVE NEGATIVE mg/dL   Nitrite NEGATIVE NEGATIVE   Leukocytes,Ua NEGATIVE NEGATIVE    Comment: Performed at [facility], [address] [place], [place] [address]  Basic metabolic panel     Status: Abnormal   Collection Time: 10/23/23  6:33 AM  Result Value Ref Range   Sodium 140 135 - 145 mmol/L   Potassium 4.0 3.5 - 5.1 mmol/L   Chloride 106 98 - 111 mmol/L   CO2 26 22 - 32 mmol/L   Glucose, Bld 114 (H) 70 - 99 mg/dL    Comment: Glucose reference range  applies only to samples taken after fasting for at least 8 hours.   BUN 10 6 - 20 mg/dL   Creatinine, Ser 6.04 0.44 - 1.00 mg/dL   Calcium  8.9 8.9 - 10.3 mg/dL   GFR, Estimated >54 >09 mL/min    Comment: (NOTE) Calculated using the CKD-EPI Creatinine Equation (2021)    Anion gap 8 5 - 15    Comment: Performed at [facility], [address] [place], [place] [address]  Iron and TIBC     Status: None   Collection Time: 10/23/23  6:33 AM  Result Value Ref Range   Iron 55 28 - 170 ug/dL   TIBC 478 295 - 621 ug/dL   Saturation Ratios 18 10.4 - 31.8 %   UIBC 260 ug/dL    Comment: Performed at [facility], [address], [place], [place] [address]  Ferritin     Status: None   Collection Time: 10/23/23  6:33 AM  Result Value Ref Range   Ferritin 52 11 - 307 ng/mL    Comment: Performed at [facility], [address] [place], [place] [address]  Reticulocytes     Status: None   Collection Time: 10/23/23  6:33 AM  Result Value Ref Range   Retic Ct Pct 2.1 0.4 - 3.1 %   RBC. 4.68 3.87 - 5.11 MIL/uL   Retic Count, Absolute 95.9 19.0 - 186.0 K/uL   Immature Retic Fract 10.3 2.3 - 15.9 %    Comment: Performed at [facility], [address] [place], [place] [address]  Phosphorus     Status: None   Collection Time: 10/23/23  6:33 AM  Result Value Ref Range   Phosphorus 3.9 2.5 - 4.6 mg/dL    Comment: Performed at [facility], [address], [place], [place] [address]  Blood Alcohol level:  Lab Results  Component Value Date   ETH <10 10/20/2023   ETH <10 09/29/2023    Metabolic Disorder Labs: Lab Results  Component Value Date   HGBA1C 5.9 (H) 07/07/2023   No results found for: "PROLACTIN" Lab Results  Component Value Date   CHOL 247 (H) 08/12/2023   TRIG 135 08/12/2023   HDL 54 08/12/2023   CHOLHDL 4.6 (H) 08/12/2023   VLDL 18 03/20/2016   LDLCALC 169 (H) 08/12/2023    LDLCALC 147 (H) 07/07/2023    Physical Findings: AIMS:  , ,  ,  ,    CIWA:    COWS:     Musculoskeletal: Strength & Muscle Tone: within normal limits Gait & Station: normal Patient leans: N/A  Psychiatric Specialty Exam:  Presentation  General Appearance:  Casually dressed, not in acute distress, good rapport.  No EPS.  Eye Contact: Good.  Speech: Spontaneous.  Normal rate, tone and volume.    Mood and Affect  Mood: Worried about her internal experience.  Affect: Restricted and appropriate.  Thought Process  Thought Processes: Linear and goal directed.  Descriptions of Associations:Intact  Orientation:Full (Time, Place and Person)  Thought Content: Patient is ruminating around continued auditory hallucination.  No guilty rumination.  No current suicidal thoughts.  No homicidal thoughts.  No thoughts of violence.   No delusional theme.  No obsessions.  Hallucinations: Intermittent auditory hallucination  Sensorium  Memory: Good.  Judgment: Good.  Insight: Good  Executive Functions  Concentration: Good.  Attention Span: Good.  Recall: Good.  Fund of Knowledge: Good.  Language: Good   Psychomotor Activity  Normal psychomotor activity    Physical Exam: Physical Exam ROS Blood pressure (!) 145/87, pulse 95, temperature 98.6 F (37 C), temperature source Oral, resp. rate 18, SpO2 97%. There is no height or weight on file to calculate BMI.   Treatment Plan Summary: Patient presented with late onset unipolar depression associated with psychotic features.  During her previous presentation, thyroid  function was suggestive of hypothyroidism.  Thyroid  function test during this presentation is within normal limits.  She continues to have auditory hallucinations.  No current suicidal ideation.  We discussed use of aripiprazole  to target psychosis.  Patient consented for treatment of therapeutic risk and benefits.  We will initiate and keep  sertraline  at current dose.  1.  Discontinue quetiapine  2.  Sertraline  200 mg daily. 3.  Aripiprazole  5 mg daily.  Will titrate as needed/tolerated. 4.  Continue to monitor mood and behavior and interaction with others. 5.  Continue to encourage unit groups and therapeutic activities. 6.  Social worker will coordinate discharge and aftercare planning.  Amelie Jury, MD 10/24/2023, 2:59 PM

## 2023-10-24 NOTE — Group Note (Signed)
 Therapy Group Note  Group Topic:Other  Group Date: 10/24/2023 Start Time: 1425 End Time: 1501 Facilitators: Othell Jaime G, OT    The primary objective of this topic is to explore and understand the concept of occupational balance in the context of daily living. The term "occupational balance" is defined broadly, encompassing all activities that occupy an individual's time and energy, including self-care, leisure, and work-related tasks. The goal is to guide participants towards achieving a harmonious blend of these activities, tailored to their personal values and life circumstances. This balance is aimed at enhancing overall well-being, not by equally distributing time across activities, but by ensuring that daily engagements are fulfilling and not draining. The content delves into identifying various barriers that individuals face in achieving occupational balance, such as overcommitment, misaligned priorities, external pressures, and lack of effective time management. The impact of these barriers on occupational performance, roles, and lifestyles is examined, highlighting issues like reduced efficiency, strained relationships, and potential health problems. Strategies for cultivating occupational balance are a key focus. These strategies include practical methods like time blocking, prioritizing tasks, establishing self-care rituals, decluttering, connecting with nature, and engaging in reflective practices. These approaches are designed to be adaptable and applicable to a wide range of life scenarios, promoting a proactive and mindful approach to daily living. The overall aim is to equip participants with the knowledge and tools to create a balanced lifestyle that supports their mental, emotional, and physical health, thereby improving their functional performance in daily life.   Maria Blackburn, OT     Participation Level: Engaged   Participation Quality: Independent   Behavior:  Appropriate   Speech/Thought Process: Relevant   Affect/Mood: Appropriate   Insight: Fair   Judgement: Fair      Modes of Intervention: Education  Patient Response to Interventions:  Attentive   Plan: Continue to engage patient in OT groups 2 - 3x/week.  10/24/2023  Lynnda Sas, OT  Maisen Schmit, OT

## 2023-10-24 NOTE — Progress Notes (Signed)
   10/24/23 1420  Spiritual Encounters  Type of Visit Follow up  Care provided to: Patient  Referral source Chaplain assessment  Reason for visit Routine spiritual support   I sought to offer spiritual care support to Maria Blackburn following group. This was prompted by previous knowledge of her and observing that she did not say much during group.  Maria Blackburn was appreciative of the check-in and shared some insight and coping skills that have been significant (boundaries). She asked when I would return and seemed to hope to have more conversation.  I offered compassionate, supportive presence and affirmed her efforts to continue seeking support and treatment. We continue to build relationship. I will return to follow up ahead of group time on 4/23.  Manfred Laspina L. Minetta Aly, M.Div [phone]

## 2023-10-25 DIAGNOSIS — F333 Major depressive disorder, recurrent, severe with psychotic symptoms: Secondary | ICD-10-CM | POA: Diagnosis not present

## 2023-10-25 NOTE — Plan of Care (Signed)
  Problem: Education: Goal: Mental status will improve Outcome: Progressing Goal: Verbalization of understanding the information provided will improve Outcome: Progressing   Problem: Activity: Goal: Interest or engagement in activities will improve Outcome: Progressing   Problem: Coping: Goal: Ability to demonstrate self-control will improve Outcome: Progressing   Problem: Physical Regulation: Goal: Ability to maintain clinical measurements within normal limits will improve Outcome: Progressing   Problem: Education: Goal: Ability to state activities that reduce stress will improve Outcome: Progressing

## 2023-10-25 NOTE — Plan of Care (Signed)
   Problem: Education: Goal: Knowledge of [facility] Education information/materials will improve Outcome: Progressing Goal: Emotional status will improve Outcome: Progressing Goal: Mental status will improve Outcome: Progressing Goal: Verbalization of understanding the information provided will improve Outcome: Progressing

## 2023-10-25 NOTE — BH Assessment (Signed)
 Patient was in day room at beginning of shift. Attended group and had snack with other patients. Pt came out to talk. States she only has small amt of anxiety and sadness because she misses home and family. Denies depression and AVH. She states "They are getting my medications right". 15 min checks maintained.Med compliant.

## 2023-10-25 NOTE — [facility] Suicide Risk Assessment (Signed)
 [facility] INPATIENT:  Family/Significant Other Suicide Prevention Education  Suicide Prevention Education:  Contact Attempts: Foisy,Darryl R (Spouse) [phone] (Mobile) , (name of family member/significant other) has been identified by the patient as the family member/significant other with whom the patient will be residing, and identified as the person(s) who will aid the patient in the event of a mental health crisis.  With written consent from the patient, two attempts were made to provide suicide prevention education, prior to and/or following the patient's discharge.  We were unsuccessful in providing suicide prevention education.  A suicide education pamphlet was given to the patient to share with family/significant other.  Date and time of second attempt:10/25/23/1154  Graydon Lazier 10/25/2023, 11:54 AM

## 2023-10-25 NOTE — Group Note (Signed)
 Recreation Therapy Group Note   Group Topic:Animal Assisted Therapy   Group Date: 10/25/2023 Start Time: 1610 End Time: 1027 Facilitators: Yurika Pereda-McCall, LRT,CTRS Location: [address] Dayroom   Animal-Assisted Activity (AAA) Program Checklist/Progress Notes Patient Eligibility Criteria Checklist & Daily Group note for Rec Tx Intervention  AAA/T Program Assumption of Risk Form signed by Patient/ or Parent Legal Guardian Yes  Patient is free of allergies or severe asthma Yes  Patient reports no fear of animals Yes  Patient reports no history of cruelty to animals Yes  Patient understands his/her participation is voluntary Yes  Patient washes hands before animal contact Yes  Patient washes hands after animal contact Yes  Education: Hand Washing, Appropriate Animal Interaction   Education Outcome: Acknowledges education.    Affect/Mood: Appropriate   Participation Level: Engaged   Participation Quality: Independent   Behavior: Appropriate   Speech/Thought Process: Focused   Insight: Good   Judgement: Good   Modes of Intervention: Teaching laboratory technician   Patient Response to Interventions:  Engaged   Education Outcome:  In group clarification offered    Clinical Observations/Individualized Feedback: Patient attended session and interacted appropriately with therapy dog and peers. Patient asked appropriate questions about therapy dog and his training. Patient shared stories about their pets at home with group.   Plan: Continue to engage patient in RT group sessions 2-3x/week.   Maria Blackburn, LRT,CTRS 10/25/2023 1:25 PM

## 2023-10-25 NOTE — Progress Notes (Signed)
 [facility] MD Progress Note  10/25/2023 2:36 PM Maria Blackburn  MRN:  [id number] Subjective:   53 year old Caucasian female, married, lives with her family, takes care of her niece daughter. Background history of MDD.  Patient was recently stabilized on combination of sertraline  and Risperdal .  She was discharged from our unit but represented with residual psychotic symptoms.  Patient was reported to taking 6 mg of risperidone  as well as to get rid of the voices in her head. Routine labs are essentially normal.  No alcohol or any psychoactive substance abroad.  Chart reviewed today.  Patient discussed that multidisciplinary team meeting.  Nursing staff reports that patient slept well.  She has not expressed any hallucinations today.  No observed response to internal stimuli today.  Seen today.  Patient tolerated recent introduction of aripiprazole  well.  She likes it.  States that it makes her feel a lot better.  She has not had any voices today.  She is not endorsing any worries or any concerns today.  She is excited about taking the medication as a long-acting injectable.  We need to make sure that she is tolerating it well and responding appropriately reinstated.  We will keep it at the same dose for a day or two before given the longer acting injectable.  Patient is not endorsing any violent thoughts towards herself or towards anybody else.  No overt delusional preoccupation.  No observed response to internal stimuli.  No evidence of akathisia.  No observable or reported side effects. Encouraged to keep ventilating her feelings to staff.   Principal Problem: MDD (major depressive disorder), recurrent, severe, with psychosis (HCC) Diagnosis: Principal Problem:   MDD (major depressive disorder), recurrent, severe, with psychosis (HCC) Active Problems:   Acute psychosis (HCC)   PTSD (post-traumatic stress disorder)  Total Time spent with patient: 30 minutes  Past Psychiatric History:  See  H&P  Past Medical History:  Past Medical History:  Diagnosis Date   Anxiety    Bladder spasms    Frequency of urination    GERD (gastroesophageal reflux disease)    Interstitial cystitis    Pelvic pain    SUI (stress urinary incontinence, female)    Urgency of urination     Past Surgical History:  Procedure Laterality Date   ABDOMINAL HYSTERECTOMY  2000   ovaries remain   CYSTO WITH HYDRODISTENSION N/A 03/24/2017   Procedure: CYSTOSCOPY/HYDRODISTENSION INSTILLATION OF MARCAINE  AND PYRIDIUM ;  Surgeon: Homero Luster, MD;  Location: [facility];  Service: Urology;  Laterality: N/A;   HYSTEROSCOPY WITH D & C  1999   POLYPECTOMY   TUBAL LIGATION Bilateral 1996   Family History:  Family History  Problem Relation Age of Onset   Heart disease Mother    Diabetes Son    Family Psychiatric  History:  See H&P  Social History:  Social History   Substance and Sexual Activity  Alcohol Use No     Social History   Substance and Sexual Activity  Drug Use No    Social History   Socioeconomic History   Marital status: Married    Spouse name: Not on file   Number of children: Not on file   Years of education: Not on file   Highest education level: Not on file  Occupational History   Not on file  Tobacco Use   Smoking status: Never   Smokeless tobacco: Never  Vaping Use   Vaping status: Never Used  Substance and Sexual Activity  Alcohol use: No   Drug use: No   Sexual activity: Yes  Other Topics Concern   Not on file  Social History Narrative   Not on file   Social Drivers of Health   Financial Resource Strain: Low Risk  (03/25/2023)   Overall Financial Resource Strain (CARDIA)    Difficulty of Paying Living Expenses: Not very hard  Food Insecurity: No Food Insecurity (10/21/2023)   Hunger Vital Sign    Worried About Running Out of Food in the Last Year: Never true    Ran Out of Food in the Last Year: Never true  Transportation Needs: No  Transportation Needs (10/21/2023)   PRAPARE - Administrator, Civil Service (Medical): No    Lack of Transportation (Non-Medical): No  Physical Activity: Patient Unable To Answer (03/25/2023)   Exercise Vital Sign    Days of Exercise per Week: Patient unable to answer    Minutes of Exercise per Session: Patient unable to answer  Stress: Patient Unable To Answer (03/25/2023)   [employer] of Occupational Health - Occupational Stress Questionnaire    Feeling of Stress : Patient unable to answer  Social Connections: Socially Integrated (10/21/2023)   Social Connection and Isolation Panel [NHANES]    Frequency of Communication with Friends and Family: More than three times a week    Frequency of Social Gatherings with Friends and Family: More than three times a week    Attends Religious Services: More than 4 times per year    Active Member of [employer] or Organizations: Yes    Attends Engineer, structural: More than 4 times per year    Marital Status: Married    Current Medications: Current Facility-Administered Medications  Medication Dose Route Frequency Provider Last Rate Last Admin   acetaminophen  (TYLENOL ) tablet 650 mg  650 mg Oral Q6H PRN Motley-Mangrum, Jadeka A, PMHNP   650 mg at 10/23/23 2106   alum & mag hydroxide-simeth (MAALOX/MYLANTA) 200-200-20 MG/5ML suspension 30 mL  30 mL Oral Q4H PRN Motley-Mangrum, Jadeka A, PMHNP       ARIPiprazole  (ABILIFY ) tablet 10 mg  10 mg Oral Daily Katheleen Stella, Iline Mallory, MD   10 mg at 10/25/23 0803   atorvastatin  (LIPITOR) tablet 20 mg  20 mg Oral Daily Motley-Mangrum, Jadeka A, PMHNP   20 mg at 10/25/23 2130   haloperidol  (HALDOL ) tablet 5 mg  5 mg Oral TID PRN Motley-Mangrum, Jadeka A, PMHNP       And   diphenhydrAMINE  (BENADRYL ) capsule 50 mg  50 mg Oral TID PRN Motley-Mangrum, Jadeka A, PMHNP       haloperidol  lactate (HALDOL ) injection 5 mg  5 mg Intramuscular TID PRN Motley-Mangrum, Jadeka A, PMHNP       And    diphenhydrAMINE  (BENADRYL ) injection 50 mg  50 mg Intramuscular TID PRN Motley-Mangrum, Jadeka A, PMHNP       And   LORazepam  (ATIVAN ) injection 2 mg  2 mg Intramuscular TID PRN Motley-Mangrum, Jadeka A, PMHNP       haloperidol  lactate (HALDOL ) injection 10 mg  10 mg Intramuscular TID PRN Motley-Mangrum, Jadeka A, PMHNP       And   diphenhydrAMINE  (BENADRYL ) injection 50 mg  50 mg Intramuscular TID PRN Motley-Mangrum, Jadeka A, PMHNP       And   LORazepam  (ATIVAN ) injection 2 mg  2 mg Intramuscular TID PRN Motley-Mangrum, Jadeka A, PMHNP       fesoterodine  (TOVIAZ ) tablet 4 mg  4 mg Oral Daily  Motley-Mangrum, Jadeka A, PMHNP   4 mg at 10/25/23 0803   magnesium  hydroxide (MILK OF MAGNESIA) suspension 30 mL  30 mL Oral Daily PRN Motley-Mangrum, Jadeka A, PMHNP       sertraline  (ZOLOFT ) tablet 200 mg  200 mg Oral Daily Ji, Andrew, MD   200 mg at 10/25/23 0804   traZODone  (DESYREL ) tablet 50 mg  50 mg Oral QHS Ji, Andrew, MD   50 mg at 10/24/23 2124    Lab Results:  No results found for this or any previous visit (from the past 48 hours).   Blood Alcohol level:  Lab Results  Component Value Date   ETH <10 10/20/2023   ETH <10 09/29/2023    Metabolic Disorder Labs: Lab Results  Component Value Date   HGBA1C 5.9 (H) 07/07/2023   No results found for: "PROLACTIN" Lab Results  Component Value Date   CHOL 247 (H) 08/12/2023   TRIG 135 08/12/2023   HDL 54 08/12/2023   CHOLHDL 4.6 (H) 08/12/2023   VLDL 18 03/20/2016   LDLCALC 169 (H) 08/12/2023   LDLCALC 147 (H) 07/07/2023    Physical Findings: AIMS:  , ,  ,  ,    CIWA:    COWS:     Musculoskeletal: Strength & Muscle Tone: within normal limits Gait & Station: normal Patient leans: N/A  Psychiatric Specialty Exam:  Presentation  General Appearance:  Casually dressed, not in acute distress, engaged politely.  No EPS.  Eye Contact: Good.  Speech: Spontaneous.  Normal rate, tone and volume.    Mood and Affect   Mood: Subjectively and objectively better.  Affect: Mood congruent.  Thought Process  Thought Processes: Linear and goal directed.  Descriptions of Associations:Intact  Orientation:Full (Time, Place and Person)  Thought Content: No delusional theme.  No negative rumination.  No guilty rumination.  No current suicidal thoughts.  No homicidal thoughts.  No thoughts of violence.  No obsessions.  Hallucinations: No hallucination in any modality.  Sensorium  Memory: Good.  Judgment: Good.  Insight: Good  Executive Functions  Concentration: Good.  Attention Span: Good.  Recall: Good.  Fund of Knowledge: Good.  Language: Good   Psychomotor Activity  Normal psychomotor activity    Physical Exam: Physical Exam ROS Blood pressure 118/85, pulse 93, temperature 98.5 F (36.9 C), temperature source Oral, resp. rate 14, SpO2 97%. There is no height or weight on file to calculate BMI.   Treatment Plan Summary: Patient has responded well to recent introduction of aripiprazole .  Her dose was titrated today to 10 mg daily.  We will keep it at that dose for now.  2 D6 inhibition by sertraline  will likely increase the blood level.  We will evaluate her further and hopefully give longer acting injectable on Thursday if responding appropriately.  1.  Aripiprazole  10 mg daily. 2.  Hopeful transition to Abilify  maintainer 300 mg monthly by thoughts today. 3.  Sertraline  200 mg daily. 4.  Continue to monitor mood and behavior and interaction with others. 5.  Continue to encourage unit groups and therapeutic activities. 6.  Social worker will coordinate discharge and aftercare planning.  Amelie Jury, MD 10/25/2023, 2:36 PM

## 2023-10-25 NOTE — Progress Notes (Signed)
 Adult Psychoeducational Group Note  Date:  10/25/2023 Time:  9:51 PM  Group Topic/Focus:  Wrap-Up Group:   The focus of this group is to help patients review their daily goal of treatment and discuss progress on daily workbooks.  Participation Level:  Active  Participation Quality:  Appropriate  Affect:  Appropriate  Cognitive:  Appropriate  Insight: Appropriate  Engagement in Group:  Engaged  Modes of Intervention:  Discussion  Additional Comments:  Pt stated her day was good. Pt met goal.  Aquilla Bayley 10/25/2023, 9:51 PM

## 2023-10-25 NOTE — Progress Notes (Signed)
   10/25/23 0900  Psych Admission Type (Psych Patients Only)  Admission Status Voluntary  Psychosocial Assessment  Patient Complaints None  Eye Contact Fair  Facial Expression Anxious  Affect Anxious  Speech Logical/coherent  Interaction Assertive  Motor Activity Other (Comment) (WNL)  Appearance/Hygiene Unremarkable  Behavior Characteristics Appropriate to situation;Cooperative  Mood Anxious;Pleasant  Thought Process  Coherency WDL  Content WDL  Delusions None reported or observed  Perception WDL  Hallucination None reported or observed  Judgment WDL  Confusion None  Danger to Self  Current suicidal ideation? Denies  Agreement Not to Harm Self Yes  Description of Agreement verbal  Danger to Others  Danger to Others None reported or observed

## 2023-10-25 NOTE — Group Note (Signed)
 Date:  10/25/2023 Time:  9:08 AM  Group Topic/Focus:  Goals Group:   The focus of this group is to help patients establish daily goals to achieve during treatment and discuss how the patient can incorporate goal setting into their daily lives to aide in recovery. Orientation:   The focus of this group is to educate the patient on the purpose and policies of crisis stabilization and provide a format to answer questions about their admission.  The group details unit policies and expectations of patients while admitted.    Participation Level:  Active  Participation Quality:  Appropriate  Affect:  Appropriate  Cognitive:  Appropriate  Insight: Appropriate  Engagement in Group:  Engaged  Modes of Intervention:  Discussion and Orientation  Additional Comments:    Draydon Clairmont D Evadna Donaghy 10/25/2023, 9:08 AM

## 2023-10-25 NOTE — Group Note (Signed)
 LCSW Group Therapy Note   Group Date: 10/25/2023 Start Time: 1100 End Time: 1200   Participation:  patient was present and actively participated in the conversation.  Type of Therapy:  Group Therapy  Title:  Money Matters: Ecologist, Confidence and Peace of Mind  Objective: To help participants understand the impact of financial stability on well-being through the lens of Maslow's Hierarchy of Needs and develop practical strategies for budgeting, saving, and debt repayment.  Goals: Increase awareness of spending habits and financial priorities, recognizing how money supports basic needs, security, and relationships. Develop simple budgeting and saving strategies to enhance stability and peace of mind.  Reduce financial stress by creating a realistic debt repayment plan, supporting long-term confidence and well-being.  Summary:  Participants explored how financial stability connects to basic needs, relationships, and self-esteem using Maslow's Hierarchy. They discussed budgeting, saving, and debt repayment strategies, identifying small, manageable changes. Through interactive discussion and self-reflection, they gained insight into their financial habits and created personal action steps for improvement.  Therapeutic Modalities Used: Elements of Cognitive Behavioral Therapy (CBT) - Addressing financial stress and thought patterns. Psychoeducation - Engineer, agricultural. Elements of Motivational Interviewing (MI) - Encouraging realistic, achievable changes. Group Support - Reducing shame and stress through shared experiences.   [name], LCSWA 10/25/2023  1:03 PM

## 2023-10-26 DIAGNOSIS — F333 Major depressive disorder, recurrent, severe with psychotic symptoms: Secondary | ICD-10-CM | POA: Diagnosis not present

## 2023-10-26 NOTE — Plan of Care (Signed)
   Problem: Education: Goal: Emotional status will improve Outcome: Progressing Goal: Mental status will improve Outcome: Progressing   Problem: Activity: Goal: Interest or engagement in activities will improve Outcome: Progressing Goal: Sleeping patterns will improve Outcome: Progressing

## 2023-10-26 NOTE — Progress Notes (Addendum)
 Patient denies SI/HI/AVH this morning. Pt denies feeling anxious or depressed this morning. Pt reports that she slept "ok" last night. Pt has been interactive on the unit and participating in groups throughout the day. Pt has been calm and cooperative throughout the day. Patient has been compliant with medications and treatment plan. Q 15 minute safety checks are in place for patient's safety. Patient is currently safe on the unit.   10/26/23 0836  Psych Admission Type (Psych Patients Only)  Admission Status Voluntary  Psychosocial Assessment  Patient Complaints None  Eye Contact Brief  Facial Expression Animated  Affect Appropriate to circumstance  Speech Logical/coherent  Interaction Assertive  Motor Activity Other (Comment) (WDL)  Appearance/Hygiene Unremarkable  Behavior Characteristics Cooperative;Calm  Mood Pleasant  Thought Process  Coherency WDL  Content WDL  Delusions None reported or observed  Perception WDL  Hallucination None reported or observed  Judgment Impaired  Confusion None  Danger to Self  Current suicidal ideation? Denies  Description of Suicide Plan No plan  Agreement Not to Harm Self Yes  Description of Agreement verbal  Danger to Others  Danger to Others None reported or observed

## 2023-10-26 NOTE — [facility] Suicide Risk Assessment (Signed)
 [facility] INPATIENT:  Family/Significant Other Suicide Prevention Education  Suicide Prevention Education:  Education Completed; Philana Younis (spouse) [phone], has been identified by the patient as the family member/significant other with whom the patient will be residing, and identified as the person(s) who will aid the patient in the event of a mental health crisis (suicidal ideations/suicide attempt).  With written consent from the patient, the family member/significant other has been provided the following suicide prevention education, prior to the and/or following the discharge of the patient. Patient's husband, Darryl confirmed patient will not have access to firearms/guns/weapons to harm herself or others. Darryl confirmed medications will be secured. Darryl denied having any safety concerns regarding Ayona returning home.   The suicide prevention education provided includes the following: Suicide risk factors Suicide prevention and interventions National Suicide Hotline telephone number [facility] assessment telephone number [facility] Emergency Assistance 911 [facility] and/or Residential Mobile Crisis Unit telephone number  Request made of family/significant other to: Remove weapons (e.g., guns, rifles, knives), all items previously/currently identified as safety concern.   Remove drugs/medications (over-the-counter, prescriptions, illicit drugs), all items previously/currently identified as a safety concern.  The family member/significant other verbalizes understanding of the suicide prevention education information provided.  The family member/significant other agrees to remove the items of safety concern listed above.  Naimah Yingst M Jahanna Raether, LCSWA 10/26/2023, 4:49 PM

## 2023-10-26 NOTE — Group Note (Signed)
 Recreation Therapy Group Note   Group Topic:Problem Solving  Group Date: 10/26/2023 Start Time: 1610 End Time: 1005 Facilitators: Kyli Sorter-McCall, LRT,CTRS Location: [address] Dayroom   Group Topic: Communication, Team Building, Problem Solving  Goal Area(s) Addresses:  Patient will effectively work with peer towards shared goal.  Patient will identify skills used to make activity successful.  Patient will identify how skills used during activity can be used to reach post d/c goals.   Intervention: STEM Activity  Activity: Straw Bridge. In teams of 3-5, patients were given 15 plastic drinking straws and an equal length of masking tape. Using the materials provided, patients were instructed to build a free standing bridge-like structure to suspend an everyday item (ex: puzzle box) off of the floor or table surface. All materials were required to be used by the team in their design. LRT facilitated post-activity discussion reviewing team process. Patients were encouraged to reflect how the skills used in this activity can be generalized to daily life post discharge.   Education: Pharmacist, community, Scientist, physiological, Discharge Planning   Education Outcome: Acknowledges education/In group clarification offered/Needs additional education.    Affect/Mood: N/A   Participation Level: Did not attend    Clinical Observations/Individualized Feedback:     Plan: Continue to engage patient in RT group sessions 2-3x/week.   Zaid Tomes-McCall, LRT,CTRS 10/26/2023 12:16 PM

## 2023-10-26 NOTE — Progress Notes (Signed)
 [facility] MD Progress Note  10/26/2023 1:18 PM Maria Blackburn  MRN:  [id number] Subjective:   53 year old Caucasian female, married, lives with her family, takes care of her niece daughter. Background history of MDD.  Patient was recently stabilized on combination of sertraline  and Risperdal .  She was discharged from our unit but represented with residual psychotic symptoms.  Patient was reported to taking 6 mg of risperidone  as well as to get rid of the voices in her head. Routine labs are essentially normal.  No alcohol or any psychoactive substance abroad.  Chart reviewed today.  Patient discussed that multidisciplinary team meeting.  Nursing staff reports that patient has continued to make progress.  She is taking her medicines as recommended.  No as needed medications.  She slept well last night.  No challenging behavior on the unit.  Social worker has been in communication with her husband.  Her husband is ready for her to come home soon.  Seen today.  Patient states that she likes her new medicines as it has been working really well for her.  States that she is in good spirits.  She is not endorsing any worries or any concerns.  Her goal is to get back home and catch up with her academic activities.  Patient states that auditory hallucinations has completely resolved as she has not had them in over 48 hours.  Patient states that she is able to sleep well at night.  She feels well rested in the morning.  She does not have any futility thoughts.  No violent thoughts.  No side effects from her medicines.  No new psychosocial stressors.  Patient changed her mind about longer acting injectable.  States that she plans to stay on the oral pills.  She discussed this with her husband and they have decided to get a pillbox that where she does not make any mistake taking more than required.  She looks forward to being with her family tomorrow.   Principal Problem: MDD (major depressive disorder), recurrent,  severe, with psychosis (HCC) Diagnosis: Principal Problem:   MDD (major depressive disorder), recurrent, severe, with psychosis (HCC) Active Problems:   Acute psychosis (HCC)   PTSD (post-traumatic stress disorder)  Total Time spent with patient: 30 minutes  Past Psychiatric History:  See H&P  Past Medical History:  Past Medical History:  Diagnosis Date   Anxiety    Bladder spasms    Frequency of urination    GERD (gastroesophageal reflux disease)    Interstitial cystitis    Pelvic pain    SUI (stress urinary incontinence, female)    Urgency of urination     Past Surgical History:  Procedure Laterality Date   ABDOMINAL HYSTERECTOMY  2000   ovaries remain   CYSTO WITH HYDRODISTENSION N/A 03/24/2017   Procedure: CYSTOSCOPY/HYDRODISTENSION INSTILLATION OF MARCAINE  AND PYRIDIUM ;  Surgeon: Homero Luster, MD;  Location: [facility];  Service: Urology;  Laterality: N/A;   HYSTEROSCOPY WITH D & C  1999   POLYPECTOMY   TUBAL LIGATION Bilateral 1996   Family History:  Family History  Problem Relation Age of Onset   Heart disease Mother    Diabetes Son    Family Psychiatric  History:  See H&P  Social History:  Social History   Substance and Sexual Activity  Alcohol Use No     Social History   Substance and Sexual Activity  Drug Use No    Social History   Socioeconomic History   Marital status:  Married    Spouse name: Not on file   Number of children: Not on file   Years of education: Not on file   Highest education level: Not on file  Occupational History   Not on file  Tobacco Use   Smoking status: Never   Smokeless tobacco: Never  Vaping Use   Vaping status: Never Used  Substance and Sexual Activity   Alcohol use: No   Drug use: No   Sexual activity: Yes  Other Topics Concern   Not on file  Social History Narrative   Not on file   Social Drivers of Health   Financial Resource Strain: Low Risk  (03/25/2023)   Overall Financial Resource  Strain (CARDIA)    Difficulty of Paying Living Expenses: Not very hard  Food Insecurity: No Food Insecurity (10/21/2023)   Hunger Vital Sign    Worried About Running Out of Food in the Last Year: Never true    Ran Out of Food in the Last Year: Never true  Transportation Needs: No Transportation Needs (10/21/2023)   PRAPARE - Administrator, Civil Service (Medical): No    Lack of Transportation (Non-Medical): No  Physical Activity: Patient Unable To Answer (03/25/2023)   Exercise Vital Sign    Days of Exercise per Week: Patient unable to answer    Minutes of Exercise per Session: Patient unable to answer  Stress: Patient Unable To Answer (03/25/2023)   [employer] of Occupational Health - Occupational Stress Questionnaire    Feeling of Stress : Patient unable to answer  Social Connections: Socially Integrated (10/21/2023)   Social Connection and Isolation Panel [NHANES]    Frequency of Communication with Friends and Family: More than three times a week    Frequency of Social Gatherings with Friends and Family: More than three times a week    Attends Religious Services: More than 4 times per year    Active Member of [employer] or Organizations: Yes    Attends Engineer, structural: More than 4 times per year    Marital Status: Married    Current Medications: Current Facility-Administered Medications  Medication Dose Route Frequency Provider Last Rate Last Admin   acetaminophen  (TYLENOL ) tablet 650 mg  650 mg Oral Q6H PRN Motley-Mangrum, Jadeka A, PMHNP   650 mg at 10/23/23 2106   alum & mag hydroxide-simeth (MAALOX/MYLANTA) 200-200-20 MG/5ML suspension 30 mL  30 mL Oral Q4H PRN Motley-Mangrum, Jadeka A, PMHNP       ARIPiprazole  (ABILIFY ) tablet 10 mg  10 mg Oral Daily Gabryela Kimbrell, Iline Mallory, MD   10 mg at 10/26/23 8657   atorvastatin  (LIPITOR) tablet 20 mg  20 mg Oral Daily Motley-Mangrum, Jadeka A, PMHNP   20 mg at 10/26/23 0758   haloperidol  (HALDOL ) tablet 5 mg  5 mg  Oral TID PRN Motley-Mangrum, Jadeka A, PMHNP       And   diphenhydrAMINE  (BENADRYL ) capsule 50 mg  50 mg Oral TID PRN Motley-Mangrum, Jadeka A, PMHNP       haloperidol  lactate (HALDOL ) injection 5 mg  5 mg Intramuscular TID PRN Motley-Mangrum, Jadeka A, PMHNP       And   diphenhydrAMINE  (BENADRYL ) injection 50 mg  50 mg Intramuscular TID PRN Motley-Mangrum, Jadeka A, PMHNP       And   LORazepam  (ATIVAN ) injection 2 mg  2 mg Intramuscular TID PRN Motley-Mangrum, Jadeka A, PMHNP       haloperidol  lactate (HALDOL ) injection 10 mg  10 mg Intramuscular  TID PRN Motley-Mangrum, Jadeka A, PMHNP       And   diphenhydrAMINE  (BENADRYL ) injection 50 mg  50 mg Intramuscular TID PRN Motley-Mangrum, Jadeka A, PMHNP       And   LORazepam  (ATIVAN ) injection 2 mg  2 mg Intramuscular TID PRN Motley-Mangrum, Jadeka A, PMHNP       fesoterodine  (TOVIAZ ) tablet 4 mg  4 mg Oral Daily Motley-Mangrum, Jadeka A, PMHNP   4 mg at 10/26/23 0757   magnesium  hydroxide (MILK OF MAGNESIA) suspension 30 mL  30 mL Oral Daily PRN Motley-Mangrum, Jadeka A, PMHNP       sertraline  (ZOLOFT ) tablet 200 mg  200 mg Oral Daily Ji, Andrew, MD   200 mg at 10/26/23 0757   traZODone  (DESYREL ) tablet 50 mg  50 mg Oral QHS Ji, Andrew, MD   50 mg at 10/25/23 2032    Lab Results:  No results found for this or any previous visit (from the past 48 hours).   Blood Alcohol level:  Lab Results  Component Value Date   ETH <10 10/20/2023   ETH <10 09/29/2023    Metabolic Disorder Labs: Lab Results  Component Value Date   HGBA1C 5.9 (H) 07/07/2023   No results found for: "PROLACTIN" Lab Results  Component Value Date   CHOL 247 (H) 08/12/2023   TRIG 135 08/12/2023   HDL 54 08/12/2023   CHOLHDL 4.6 (H) 08/12/2023   VLDL 18 03/20/2016   LDLCALC 169 (H) 08/12/2023   LDLCALC 147 (H) 07/07/2023    Physical Findings: AIMS:  , ,  ,  ,    CIWA:    COWS:     Musculoskeletal: Strength & Muscle Tone: within normal limits Gait &  Station: normal Patient leans: N/A  Psychiatric Specialty Exam:  Presentation  General Appearance:  Casually dressed, not in any distress, good tenderness.  No EPS.  Eye Contact: Good.  Speech: Spontaneous.  Normal rate, tone and volume.    Mood and Affect  Mood: Euthymic.  Affect: Full range and mood congruent.  Thought Process  Thought Processes: Linear and goal directed.  Descriptions of Associations:Intact  Orientation:Full (Time, Place and Person)  Thought Content: Patient is future oriented.  No delusional theme.  No negative rumination.  No guilty rumination.  No current suicidal thoughts.  No homicidal thoughts.  No thoughts of violence.  No obsessions.  Hallucinations: No hallucination in any modality.  Sensorium  Memory: Good.  Judgment: Good.  Insight: Good  Executive Functions  Concentration: Good.  Attention Span: Good.  Recall: Good.  Fund of Knowledge: Good.  Language: Good   Psychomotor Activity  Normal psychomotor activity    Physical Exam: Physical Exam ROS Blood pressure (!) 141/74, pulse (!) 110, temperature 98.6 F (37 C), temperature source Oral, resp. rate 16, SpO2 95%. There is no height or weight on file to calculate BMI.   Treatment Plan Summary: Patient is doing well on her regimen.  No residual psychotic symptoms.  No evidence of depression.  No evidence of mania.  No dangerousness.  She has changed her mind with respect to long-acting injectable.  We will keep her medicines the same.  Hopeful discharge tomorrow if she maintains stability.  1.  Aripiprazole  10 mg daily. 2.  Sertraline  200 mg daily. 3.  Continue to monitor mood and behavior and interaction with others. 4.  Continue to encourage unit groups and therapeutic activities. 5.  Social worker will coordinate discharge and aftercare planning.  Chudney Scheffler A Roxann Vierra,  MD 10/26/2023, 1:18 PM

## 2023-10-26 NOTE — Group Note (Signed)
 Date:  10/26/2023 Time:  9:24 PM  Group Topic/Focus:  Narcotics Anonymous (NA) Meeting    Participation Level:  Active  Participation Quality:  Appropriate  Affect:  Appropriate  Cognitive:  Appropriate  Insight: Appropriate  Engagement in Group:  Engaged  Modes of Intervention:  Socialization and Support  Additional Comments:  Patient attended NA  Dillard Frame 10/26/2023, 9:24 PM

## 2023-10-27 DIAGNOSIS — F333 Major depressive disorder, recurrent, severe with psychotic symptoms: Secondary | ICD-10-CM | POA: Diagnosis not present

## 2023-10-27 MED ORDER — TRAZODONE HCL 50 MG PO TABS
50.0000 mg | ORAL_TABLET | Freq: Every day | ORAL | 0 refills | Status: DC
Start: 2023-10-27 — End: 2023-10-31

## 2023-10-27 MED ORDER — ARIPIPRAZOLE 10 MG PO TABS: 10.0000 mg | ORAL_TABLET | Freq: Every day | ORAL | 0 refills | Status: DC

## 2023-10-27 MED ORDER — SERTRALINE HCL 100 MG PO TABS: 200.0000 mg | ORAL_TABLET | Freq: Every day | ORAL | 0 refills | Status: DC

## 2023-10-27 NOTE — BHH Group Notes (Signed)
 Spirituality Group   Description: Participant directed exploration of values, beliefs and meaning   Following a brief framework of chaplain's role and ground rules of group behavior, participants are invited to share concerns or questions that engage spiritual life. Emphasis placed on common themes and shared experiences and ways to make meaning and clarify living into one's values.   Theory/Process/Goal: Utilize the theoretical framework of group therapy established by Derrell Flight, Relational Cultural Theory and Rogerian approaches to facilitate relational empathy and use of the "here and now" to foster reflection, self-awareness, and sharing.   Observations: Maria Blackburn was an active participant in the group discussion. She was the most outgoing I have yet seen, leading off the group discussion.  Jayven Naill L. Minetta Aly, M.Div [phone]

## 2023-10-27 NOTE — Progress Notes (Signed)
  [facility] Management Discharge Plan :  Will you be returning to the same living situation after discharge:  Yes,  home with husband, Darryl Lattanzio At discharge, do you have transportation home?: Yes,  husband will transport Do you have the ability to pay for your medications: Yes,  has insurance  Release of information consent forms completed and in the chart;  Patient's signature needed at discharge.  Patient to Follow up at:  Follow-up Information     [facility]. Go on 11/01/2023.   Specialty: Professional Counselor Why: Please go to this provider on 11/01/23 at 9:00 am for an assessment, to obtain appointments for therapy services. You may also go on Monday through Friday, from 9 am to 1 pm. Contact information: [address]  [address] [place] [address] [phone]         [facility]. Go on 11/08/2023.   Specialty: Behavioral Health Why: Please go to this provider on 11/08/23 at 7:00 am for an assessment, to obtain an appointment for medication management services. You may also go on Monday through Friday, arrive by 7:00 am. Contact information: [address] [place]  [address] [phone]                Next level of care provider has access to [facility] Link:no  Safety Planning and Suicide Prevention discussed: Yes,  with husband Tahirih Lair     Has patient been referred to the Quitline?: Patient does not use tobacco/nicotine products  Patient has been referred for addiction treatment: No known substance use disorder.  Lucrezia Sachs, LCSW 10/27/2023, 10:16 AM

## 2023-10-27 NOTE — Group Note (Signed)
 Date:  10/27/2023 Time:  9:20 AM  Group Topic/Focus:  Goals Group:   The focus of this group is to help patients establish daily goals to achieve during treatment and discuss how the patient can incorporate goal setting into their daily lives to aide in recovery.    Participation Level:  Active  Participation Quality:  Appropriate  Affect:  Appropriate  Cognitive:  Appropriate  Insight: Appropriate  Engagement in Group:  Engaged  Modes of Intervention:  Activity  Additional Comments:    Linnell Richardson 10/27/2023, 9:20 AM

## 2023-10-27 NOTE — BHH Suicide Risk Assessment (Signed)
 [facility] Discharge Suicide Risk Assessment   Principal Problem: MDD (major depressive disorder), recurrent, severe, with psychosis (HCC) Discharge Diagnoses: Principal Problem:   MDD (major depressive disorder), recurrent, severe, with psychosis (HCC) Active Problems:   Acute psychosis (HCC)   PTSD (post-traumatic stress disorder)   Total Time spent with patient: 30 minutes  Musculoskeletal: Strength & Muscle Tone: within normal limits Gait & Station: normal Patient leans: N/A  Psychiatric Specialty Exam  Presentation  General Appearance:  Appropriate for Environment; Casual  Eye Contact: Good  Speech: Clear and Coherent; Normal Rate  Speech Volume: Normal  Handedness: Right   Mood and Affect  Mood: Depressed; Anxious  Duration of Depression Symptoms: No data recorded Affect: Appropriate; Congruent   Thought Process  Thought Processes: Coherent; Goal Directed; Linear  Descriptions of Associations:Intact  Orientation:Full (Time, Place and Person)  Thought Content:Logical  History of Schizophrenia/Schizoaffective disorder:No data recorded Duration of Psychotic Symptoms:No data recorded Hallucinations:No data recorded Ideas of Reference:None  Suicidal Thoughts:No data recorded Homicidal Thoughts:No data recorded  Sensorium  Memory: Immediate Good; Recent Good; Remote Good  Judgment: Fair  Insight: Fair   Art therapist  Concentration: Good  Attention Span: Good  Recall: Good  Fund of Knowledge: Good  Language: Good   Psychomotor Activity  Psychomotor Activity:No data recorded  Assets  Assets: Communication Skills; Desire for Improvement; Physical Health   Sleep  Sleep:No data recorded  Physical Exam: Physical Exam ROS Blood pressure 129/87, pulse (!) 105, temperature 98 F (36.7 C), temperature source Oral, resp. rate 16, SpO2 97%. There is no height or weight on file to calculate BMI.  Mental Status Per Nursing  Assessment::   On Admission:  NA  Demographic Factors:  Caucasian  Loss Factors: NA  Historical Factors: Family history of mental illness or substance abuse  Risk Reduction Factors:   Responsible for children under 27 years of age, Sense of responsibility to family, Living with another person, especially a relative, Positive social support, Positive therapeutic relationship, and Positive coping skills or problem solving skills  Continued Clinical Symptoms:  Psychotic symptoms have completely resolved.  Patient is not depressed.  No overwhelming anxiety.  No manic symptoms.  Cognitive Features That Contribute To Risk:  None    Suicide Risk:  Minimal: No current suicidal thoughts.  No current homicidal thoughts.  No thoughts of violence.  Modifiable risk factor targeted during this admission as psychosis and depression.  Patient has responded well to medication adjustment.  No new psychosocial stressor.  She has good support from her family. At this point in time, patient is not a danger to herself or to anyone.  Patient is stable for care at the lower setting.  Follow-up Information     [facility]. Go on 11/01/2023.   Specialty: Professional Counselor Why: Please go to this provider on 11/01/23 at 9:00 am for an assessment, to obtain appointments for therapy services. You may also go on Monday through Friday, from 9 am to 1 pm. Contact information: [address]  [address] [place] [address] [phone]         [facility]. Go on 11/08/2023.   Specialty: Behavioral Health Why: Please go to this provider on 11/08/23 at 7:00 am for an assessment, to obtain an appointment for medication management services. You may also go on Monday through Friday, arrive by 7:00 am. Contact information: [address] [place]  [address] [phone]  Plan Of Care/Follow-up recommendations:  See discharge  summary.  Amelie Jury, MD 10/27/2023, 9:52 AM

## 2023-10-27 NOTE — Progress Notes (Signed)
 D: Pt A&O x 4. Denies SI, HI, AVH, and pain at this time. Discharge home as ordered. Picked up in lobby by husband.   A: Discharge instructions reviewed with pt including electronic prescriptions and follow up appointments; compliance encouraged. All belongings from locker 32 given to pt at time of departure. Scheduled meds given with verbal education and effects monitored. Safety checks maintained without incident until time of discharge.   R: Pt receptive to care. Compliant with meds when offered. Denies adverse drug reactions when assessed. Verbalized understanding of d/c instructions. Signed belongings sheet in agreement with items received from locker. Ambulatory with steady gait. Appears to be in no physical distress at time of departure.

## 2023-10-27 NOTE — Discharge Summary (Signed)
 Physician Discharge Summary Note  Patient:  Maria Blackburn is an 53 y.o., female MRN:  [id number] DOB:  03-03-1971 Patient phone:  [phone] (home)  Patient address:   [address] [place] [address],  Total Time spent with patient: 45 minutes  Date of Admission:  10/21/2023 Date of Discharge: 10/27/2023  Reason for Admission:   Maria Blackburn is a 53 y.o. female with a past psychiatric history of major depressive disorder and generalized anxiety disorder admitted voluntarily to the [facility] from [facility] Emergency Department for evaluation and management of auditory hallucinations and paranoia which she had attempted to take 6 tablets of 1 mg risperidone  and 3 tablets of 100 mg hydroxyzine . She had recently been discharged from [facility] on 10/04/23. Please see 09/29/23 H&P for details about hospitalization.   Patient reports that since last psychiatric hospitalization, she continued to struggle with auditory hallucinations.  She reports that they almost immediately recurred and progressively gotten worse after returning home.  She states that her symptoms of psychosis are similar to before with the exception that she began to hear her son's voice fully knowing that he was at work while she was at home.  She reports hearing that he was talking with an unknown female and the female was threatening him to lie in court or they would hurt son.  Patient checked with husband to see if he heard any of the things that she did when he did not, patient on a walk and the voices continue to follow her.  She denies VH and reports that her present auditory hallucinations appear to be improving since being hospitalized.  She reports that she did not return to the hospital earlier because she had assumed that the risperidone  will take time to fully work.  She reports this psychotic symptoms are persistent throughout the day.  She attempted to take more risperidone  and hydroxyzine  to help  with her mental health and the hallucinations but they continued to be unbearable so she came to the hospital to be further evaluated.  She adamantly denies that this was an attempt to kill herself or harm herself in any way.  She denies any clear environmental changes that would explain why she was actively experiencing the symptoms. Patient reports that current level of stress remains very high citing she was taking online classes, taking care of nieces son, financial stress, and auditory hallucinations.  She denied any recent substance abuse.   She continues to feel that she has been struggling with depression and states she has felt a prolonged grief that dates back to when brother in law abruptly died several years ago.  She denies present SI/HI.  She also states that the situation related to her niece threatening to kidnap niece's son whom patient has custody of was very traumatic and she endorses hypervigilance, negative alterations in mood, intrusive thoughts, flashbacks, avoidance, and sporadic nightmares.  She does endorse poor sleep for extensive amounts of time stating she only slept approximately 4 hours per night approximately 2 weeks prior to any psychotic symptoms and has had limited sleep since returning home as well.  She reports eating appropriately but does state she does not eat very healthily.  She states that she wanted to start therapy to address her PTSD, grief, and anxiety but did not have the opportunity to before the psychosis began.  We discussed reality testing techniques given that she is also recognizing that these hallucinations are not true.  Principal Problem:  MDD (major depressive disorder), recurrent, severe, with psychosis (HCC) Discharge Diagnoses: Principal Problem:   MDD (major depressive disorder), recurrent, severe, with psychosis (HCC) Active Problems:   Acute psychosis (HCC)   PTSD (post-traumatic stress disorder)   Past Psychiatric History:  Previous psych  diagnoses:  Patient reported history of depressive symptoms since 1999, officially diagnosed with major depressive disorder in 2016. Patient also reported a diagnosis of generalized anxiety disorder and panic disorder in 2007. Prior inpatient psychiatric treatment: Denies Prior outpatient psychiatric treatment: Denies Current psychiatric provider: Denies   Neuromodulation history: denies   Current therapist: Denies Psychotherapy hx: Denies   History of suicide attempts: Patient denied, but did report intermittent thoughts of passive SI without plan or intent since 2016. Patient stated last passive SI was 3 months ago History of homicide: Denies   Psychotropic medications: Current Zoloft  100 mg once a day - patient reported taking consistently with poor response and no side effects  Hydroxyzine  100 mg every 6 hours prn - patient reported taking as needed   Past Patient reported not remembering past medication trials, efficacies, or side effects   Substance Use History: Alcohol:  Patient reported having one mixed drink every couple of years; reported last drink was 2021 Hx withdrawal tremors/shakes: denies Hx alcohol related blackouts: denies Hx alcohol induced hallucinations: denies Hx alcoholic seizures: denies Hx medical hospitalization due to severe alcohol withdrawal symptoms: denies DUI: denies   Past Medical History:  Past Medical History:  Diagnosis Date   Anxiety    Bladder spasms    Frequency of urination    GERD (gastroesophageal reflux disease)    Interstitial cystitis    Pelvic pain    SUI (stress urinary incontinence, female)    Urgency of urination     Past Surgical History:  Procedure Laterality Date   ABDOMINAL HYSTERECTOMY  2000   ovaries remain   CYSTO WITH HYDRODISTENSION N/A 03/24/2017   Procedure: CYSTOSCOPY/HYDRODISTENSION INSTILLATION OF MARCAINE  AND PYRIDIUM ;  Surgeon: Homero Luster, MD;  Location: [facility];  Service: Urology;   Laterality: N/A;   HYSTEROSCOPY WITH D & C  1999   POLYPECTOMY   TUBAL LIGATION Bilateral 1996   Family History:  Family History  Problem Relation Age of Onset   Heart disease Mother    Diabetes Son    Family Psychiatric  History:  Family history of mood disorder and addiction.  Social History:  Social History   Substance and Sexual Activity  Alcohol Use No     Social History   Substance and Sexual Activity  Drug Use No    Social History   Socioeconomic History   Marital status: Married    Spouse name: Not on file   Number of children: Not on file   Years of education: Not on file   Highest education level: Not on file  Occupational History   Not on file  Tobacco Use   Smoking status: Never   Smokeless tobacco: Never  Vaping Use   Vaping status: Never Used  Substance and Sexual Activity   Alcohol use: No   Drug use: No   Sexual activity: Yes  Other Topics Concern   Not on file  Social History Narrative   Not on file   Social Drivers of Health   Financial Resource Strain: Low Risk  (03/25/2023)   Overall Financial Resource Strain (CARDIA)    Difficulty of Paying Living Expenses: Not very hard  Food Insecurity: No Food Insecurity (10/21/2023)   Hunger  Vital Sign    Worried About Programme researcher, broadcasting/film/video in the Last Year: Never true    Ran Out of Food in the Last Year: Never true  Transportation Needs: No Transportation Needs (10/21/2023)   PRAPARE - Administrator, Civil Service (Medical): No    Lack of Transportation (Non-Medical): No  Physical Activity: Patient Unable To Answer (03/25/2023)   Exercise Vital Sign    Days of Exercise per Week: Patient unable to answer    Minutes of Exercise per Session: Patient unable to answer  Stress: Patient Unable To Answer (03/25/2023)   [employer] of Occupational Health - Occupational Stress Questionnaire    Feeling of Stress : Patient unable to answer  Social Connections: Socially Integrated  (10/21/2023)   Social Connection and Isolation Panel [NHANES]    Frequency of Communication with Friends and Family: More than three times a week    Frequency of Social Gatherings with Friends and Family: More than three times a week    Attends Religious Services: More than 4 times per year    Active Member of [employer] or Organizations: Yes    Attends Engineer, structural: More than 4 times per year    Marital Status: Married    Hospital Course:   Patient was admitted on suicide precautions.  Sertraline  was restarted and the dose titrated to 200 mg daily.  Patient was briefly on quetiapine  but was later switched to aripiprazole  which she tolerated well.  She responded well to the adjustments.  Psychosis resolved as aripiprazole  kicked in.  Patient interacted well with the milieu.  She did not exhibit any disorganized behavior on the unit.  No self-injurious thoughts or behavior during her hospital stay.  No violent thoughts during her hospital stay.  Patient groomed herself well.  Her family was supportive during her hospital stay.  At interview today, she tells me that she feels good.  States that the medicine is working really well for her.  She has not heard any voices lately.  She is not endorsing any paranoia.  She is not endorsing any other form of delusion.  She feels good and ready to go home.  Nursing staff reports that patient slept for 8.75 hours.  She has been appropriate on the unit.  She has been adherent with her medicine.  No observed response to internal stimuli.  No PRNs required lately.  Social worker has been in communication with patient's family.  No concerns.  Her husband is picking out today.  Patient and team agrees that she is back to her baseline.  Team agrees with discharge today.   Physical Findings: AIMS:  , ,  ,  ,    CIWA:    COWS:     Musculoskeletal: Strength & Muscle Tone: within normal limits Gait & Station: normal Patient leans:  N/A   Psychiatric Specialty Exam:  Presentation  General Appearance and behavior:  Casually dressed, not in any distress, appropriate behavior, engaged politely.  No EPS.  Eye Contact: Good.  Speech: Spontaneous.  Normal rate, tone and volume.  Normal prosody of speech.  Mood and Affect  Mood: Euthymic.  Affect: Full range and appropriate.  Thought Process  Thought Processes: Linear and goal directed.  Descriptions of Associations:Intact  Orientation:Full (Time, Place and Person)  Thought Content: No current suicidal thoughts.  No homicidal thoughts.  No thoughts of violence.  No negative ruminative flooding.  No guilty ruminations.  No delusional theme.  No obsessions.  Hallucinations: No hallucination in any modality.  Sensorium  Memory: Good.  Judgment: Good.  Insight: Good  Executive Functions  Concentration: Good.  Attention Span: Good.  Recall: Good.  Fund of Knowledge: Good.  Language: Good   Psychomotor Activity  Normal psychomotor activity    Physical Exam: Physical Exam ROS Blood pressure 129/87, pulse (!) 105, temperature 98 F (36.7 C), temperature source Oral, resp. rate 16, SpO2 97%. There is no height or weight on file to calculate BMI.   Social History   Tobacco Use  Smoking Status Never  Smokeless Tobacco Never   Tobacco Cessation:  N/A, patient does not currently use tobacco products   Blood Alcohol level:  Lab Results  Component Value Date   ETH <10 10/20/2023   ETH <10 09/29/2023    Metabolic Disorder Labs:  Lab Results  Component Value Date   HGBA1C 5.9 (H) 07/07/2023   No results found for: "PROLACTIN" Lab Results  Component Value Date   CHOL 247 (H) 08/12/2023   TRIG 135 08/12/2023   HDL 54 08/12/2023   CHOLHDL 4.6 (H) 08/12/2023   VLDL 18 03/20/2016   LDLCALC 169 (H) 08/12/2023   LDLCALC 147 (H) 07/07/2023    See Psychiatric Specialty Exam and Suicide Risk Assessment completed by  Attending Physician prior to discharge.  Discharge destination:  Home  Is patient on multiple antipsychotic therapies at discharge:  No   Has Patient had three or more failed trials of antipsychotic monotherapy by history:  No  Recommended Plan for Multiple Antipsychotic Therapies: NA  Discharge Instructions     Diet - low sodium heart healthy   Complete by: As directed    Increase activity slowly   Complete by: As directed       Allergies as of 10/27/2023   No Known Allergies      Medication List     STOP taking these medications    hydrOXYzine  100 MG capsule Commonly known as: VISTARIL    ibuprofen  200 MG tablet Commonly known as: ADVIL    Phos-NaK 280-160-250 MG Pack Generic drug: potassium & sodium phosphates    risperiDONE  1 MG tablet Commonly known as: RISPERDAL        TAKE these medications      Indication  ARIPiprazole  10 MG tablet Commonly known as: ABILIFY  Take 1 tablet (10 mg total) by mouth daily. Start taking on: October 28, 2023  Indication: psychosis   atorvastatin  20 MG tablet Commonly known as: LIPITOR Take 1 tablet (20 mg total) by mouth daily.  Indication: High Amount of Fats in the Blood   sertraline  100 MG tablet Commonly known as: ZOLOFT  Take 2 tablets (200 mg total) by mouth daily. Start taking on: October 28, 2023 What changed: how much to take  Indication: Generalized Anxiety Disorder, Major Depressive Disorder   solifenacin  10 MG tablet Commonly known as: VESICARE  Take 1 tablet (10 mg total) by mouth daily. What changed: how much to take  Indication: Overactive Bladder   traZODone  50 MG tablet Commonly known as: DESYREL  Take 1 tablet (50 mg total) by mouth at bedtime.  Indication: Trouble Sleeping        Follow-up Information     [facility] Go on 11/01/2023.   Specialty: Professional Counselor Why: Please go to this provider on 11/01/23 at 9:00 am for an assessment, to obtain appointments for  therapy services. You may also go on Monday through Friday, from 9 am to 1 pm. Contact information: [address] [employer]  [place] [address] [phone]         [facility]. Go on 11/08/2023.   Specialty: Behavioral Health Why: Please go to this provider on 11/08/23 at 7:00 am for an assessment, to obtain an appointment for medication management services. You may also go on Monday through Friday, arrive by 7:00 am. Contact information: [address] [place]  [address] [phone]                Follow-up recommendations:  Patient will adhere with medication as recommended.  Patient will follow up as recommended.  No restrictions with respect to diet or activity.   Signed: Amelie Jury, MD 10/27/2023, 9:46 AM

## 2023-10-27 NOTE — Group Note (Signed)
 LCSW Group Therapy Note   Group Date: 10/27/2023 Start Time: 1100 End Time: 1200   Participation:  patient was present.  She listened but didn't participate in the conversation.  Type of Therapy:  Group Therapy    Topic:  "Healing From Within: Understanding Our Past, Building Our Future"  Objective:  To help participants understand the impact of early experiences on mental and physical health, with a focus on Adverse Childhood Experiences (ACEs), and to explore ways to build resilience and healing.  Group Goals: Understand ACEs and Their Impact: Learn how childhood experiences shape mental and physical health. Build Resilience: Develop strategies for overcoming challenges and creating positive change. Promote Healing: Recognize the value of support and the possibility of healing through therapy and self-care.  Summary: In today's session, we discussed how early experiences, especially ACEs, impact mental and physical health. We explored the effects of stress, abuse, and neglect on brain development and well-being. The group focused on resilience, understanding that healing and positive change are possible with support and self-awareness.  Therapeutic Modalities Used: Psychoeducation: Sharing information about ACEs and their effects. Cognitive Behavioral Therapy (CBT): Helping reframe negative thought patterns. Trauma-Informed Therapy: Creating a safe, supportive space for healing.   Maria Blackburn O Maria Blackburn, LCSWA 10/27/2023  1:11 PM

## 2023-10-31 ENCOUNTER — Ambulatory Visit (HOSPITAL_COMMUNITY): Admitting: Psychiatry

## 2023-10-31 ENCOUNTER — Encounter (HOSPITAL_COMMUNITY): Payer: Self-pay | Admitting: Psychiatry

## 2023-10-31 VITALS — BP 144/79 | HR 75 | Temp 97.7°F | Ht 66.0 in | Wt 185.2 lb

## 2023-10-31 DIAGNOSIS — F411 Generalized anxiety disorder: Secondary | ICD-10-CM

## 2023-10-31 DIAGNOSIS — F4310 Post-traumatic stress disorder, unspecified: Secondary | ICD-10-CM | POA: Diagnosis not present

## 2023-10-31 DIAGNOSIS — F333 Major depressive disorder, recurrent, severe with psychotic symptoms: Secondary | ICD-10-CM | POA: Diagnosis not present

## 2023-10-31 MED ORDER — TRAZODONE HCL 50 MG PO TABS: 50.0000 mg | ORAL_TABLET | Freq: Every day | ORAL | 3 refills | Status: DC

## 2023-10-31 MED ORDER — ARIPIPRAZOLE 15 MG PO TABS: 15.0000 mg | ORAL_TABLET | Freq: Every day | ORAL | 3 refills | Status: DC

## 2023-10-31 MED ORDER — SERTRALINE HCL 100 MG PO TABS: 200.0000 mg | ORAL_TABLET | Freq: Every day | ORAL | 3 refills | Status: DC
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
## 2023-11-04 ENCOUNTER — Ambulatory Visit (INDEPENDENT_AMBULATORY_CARE_PROVIDER_SITE_OTHER): Admitting: Family

## 2023-11-04 ENCOUNTER — Encounter: Payer: Self-pay | Admitting: Family

## 2023-11-04 VITALS — BP 118/76 | HR 97 | Temp 98.2°F | Resp 16 | Ht 66.0 in | Wt 184.6 lb

## 2023-11-04 DIAGNOSIS — R9431 Abnormal electrocardiogram [ECG] [EKG]: Secondary | ICD-10-CM | POA: Diagnosis not present

## 2023-11-04 DIAGNOSIS — Z23 Encounter for immunization: Secondary | ICD-10-CM

## 2023-11-04 DIAGNOSIS — R440 Auditory hallucinations: Secondary | ICD-10-CM | POA: Diagnosis not present

## 2023-11-04 DIAGNOSIS — T43591A Poisoning by other antipsychotics and neuroleptics, accidental (unintentional), initial encounter: Secondary | ICD-10-CM

## 2023-11-04 NOTE — Progress Notes (Signed)
 Hospitalization follow up.

## 2023-11-04 NOTE — Progress Notes (Signed)
 Patient ID: Maria Blackburn, female    DOB: 10-31-1970  MRN: [id number]  CC: Emergency Department Follow-Up  Subjective: Maria Blackburn is a 53 y.o. female who presents for Emergency Department follow-up.   Her concerns today include:  10/20/2023 - 10/21/2023 [facility] at [facility] per MD note:                              Medical Decision Making This patient presents to the ED with chief complaint(s) of hallucinations, overdose with pertinent past medical history of anxiety, depression which further complicates the presenting complaint. The complaint involves an extensive differential diagnosis and also carries with it a high risk of complications and morbidity.     The differential diagnosis includes psychosis, patient denies SI, intoxication, Co. ingestion   Additional history obtained: Additional history obtained from EMS  Records reviewed PCP and [facility] records   ED Course and Reassessment: On patient's arrival she is hemodynamically stable, drowsy but arousable to voice though easily falls back asleep on exam.  EKG on arrival did have a prolonged QT interval new compared to prior EKG.  Patient will have labs including mag, Tylenol  salicylate to evaluate for coingestions and other toxic ingestions.  She will be kept on telemetry.  Poison control will be called for further monitoring recommendations. Does not need IVC at this time but patient is agreeable for psychiatry evaluation of the hallucinations once she is medically cleared.   Independent labs interpretation:  The following labs were independently interpreted: mild hypokalemia, otherwise initial labs normal   Independent visualization of imaging: - N/A       Amount and/or Complexity of Data Reviewed Labs: ordered.   Risk Prescription drug management.   Today's office visit 11/04/2023: Patient reports feeling improved. Doing well on medication regimen, no issues/concerns. Established with  Behavioral Health and a therapist. She denies thoughts of self-harm, suicidal ideations, homicidal ideations. No issues/concerns for discussion today.  Patient Active Problem List   Diagnosis Date Noted   PTSD (post-traumatic stress disorder) 10/22/2023   MDD (major depressive disorder), recurrent, severe, with psychosis (HCC) 10/21/2023   Acute psychosis (HCC) 09/29/2023   GAD (generalized anxiety disorder) 09/29/2023   MDD (major depressive disorder) 09/29/2023   Adhesive capsulitis of right shoulder 09/16/2023   Nontraumatic complete tear of right rotator cuff 09/16/2023   De Quervain's tenosynovitis, left 09/10/2021   Lateral epicondylitis, right elbow 09/10/2021   Allergy to environmental factors 10/07/2017     Current Outpatient Medications on File Prior to Visit  Medication Sig Dispense Refill   ARIPiprazole  (ABILIFY ) 15 MG tablet Take 1 tablet (15 mg total) by mouth daily. 30 tablet 3   atorvastatin  (LIPITOR) 20 MG tablet Take 1 tablet (20 mg total) by mouth daily. 30 tablet 0   sertraline  (ZOLOFT ) 100 MG tablet Take 2 tablets (200 mg total) by mouth daily. 60 tablet 3   solifenacin  (VESICARE ) 10 MG tablet Take 1 tablet (10 mg total) by mouth daily. (Patient taking differently: Take 5 mg by mouth daily.) 30 tablet 11   traZODone  (DESYREL ) 50 MG tablet Take 1 tablet (50 mg total) by mouth at bedtime. 30 tablet 3   No current facility-administered medications on file prior to visit.    No Known Allergies  Social History   Socioeconomic History   Marital status: Married    Spouse name: Not on file   Number of children: Not  on file   Years of education: Not on file   Highest education level: Not on file  Occupational History   Not on file  Tobacco Use   Smoking status: Never   Smokeless tobacco: Never  Vaping Use   Vaping status: Never Used  Substance and Sexual Activity   Alcohol use: No   Drug use: No   Sexual activity: Yes  Other Topics Concern   Not on file   Social History Narrative   Not on file   Social Drivers of Health   Financial Resource Strain: Low Risk  (03/25/2023)   Overall Financial Resource Strain (CARDIA)    Difficulty of Paying Living Expenses: Not very hard  Food Insecurity: No Food Insecurity (10/21/2023)   Hunger Vital Sign    Worried About Running Out of Food in the Last Year: Never true    Ran Out of Food in the Last Year: Never true  Transportation Needs: No Transportation Needs (10/21/2023)   PRAPARE - Administrator, Civil Service (Medical): No    Lack of Transportation (Non-Medical): No  Physical Activity: Patient Unable To Answer (03/25/2023)   Exercise Vital Sign    Days of Exercise per Week: Patient unable to answer    Minutes of Exercise per Session: Patient unable to answer  Stress: Patient Unable To Answer (03/25/2023)   [employer] of Occupational Health - Occupational Stress Questionnaire    Feeling of Stress : Patient unable to answer  Social Connections: Socially Integrated (10/21/2023)   Social Connection and Isolation Panel [NHANES]    Frequency of Communication with Friends and Family: More than three times a week    Frequency of Social Gatherings with Friends and Family: More than three times a week    Attends Religious Services: More than 4 times per year    Active Member of [employer] or Organizations: Yes    Attends Banker Meetings: More than 4 times per year    Marital Status: Married  Catering manager Violence: Not At Risk (10/21/2023)   Humiliation, Afraid, Rape, and Kick questionnaire    Fear of Current or Ex-Partner: No    Emotionally Abused: No    Physically Abused: No    Sexually Abused: No    Family History  Problem Relation Age of Onset   Heart disease Mother    Diabetes Son     Past Surgical History:  Procedure Laterality Date   ABDOMINAL HYSTERECTOMY  2000   ovaries remain   CYSTO WITH HYDRODISTENSION N/A 03/24/2017   Procedure:  CYSTOSCOPY/HYDRODISTENSION INSTILLATION OF MARCAINE  AND PYRIDIUM ;  Surgeon: Homero Luster, MD;  [facility];  Service: Urology;  Laterality: N/A;   HYSTEROSCOPY WITH D & C  1999   POLYPECTOMY   TUBAL LIGATION Bilateral 1996    ROS: Review of Systems Negative except as stated above  PHYSICAL EXAM: BP 118/76   Pulse 97   Temp 98.2 F (36.8 C) (Oral)   Resp 16   Ht 5\' 6"  (1.676 m)   Wt 184 lb 9.6 oz (83.7 kg)   SpO2 94%   BMI 29.80 kg/m   Physical Exam HENT:     Head: Normocephalic and atraumatic.     Nose: Nose normal.     Mouth/Throat:     Mouth: Mucous membranes are moist.     Pharynx: Oropharynx is clear.  Eyes:     Extraocular Movements: Extraocular movements intact.     Conjunctiva/sclera: Conjunctivae normal.  Pupils: Pupils are equal, round, and reactive to light.  Cardiovascular:     Rate and Rhythm: Normal rate and regular rhythm.     Pulses: Normal pulses.     Heart sounds: Normal heart sounds.  Pulmonary:     Effort: Pulmonary effort is normal.     Breath sounds: Normal breath sounds.  Musculoskeletal:        General: Normal range of motion.     Cervical back: Normal range of motion and neck supple.  Neurological:     General: No focal deficit present.     Mental Status: She is alert and oriented to person, place, and time.  Psychiatric:        Mood and Affect: Mood normal.        Behavior: Behavior normal.     ASSESSMENT AND PLAN: 1. Overdose of risperidone  (Primary) 2. Auditory hallucinations - Patient denies thoughts of self-harm, suicidal ideations, homicidal ideations. - Continue present management.  - Keep all scheduled appointments with Behavioral Health and therapist.  3. Prolonged QT interval - Referral to Cardiology for evaluation/management. - Ambulatory referral to Cardiology   Patient was given the opportunity to ask questions.  Patient verbalized understanding of the plan and was able to repeat key  elements of the plan. Patient was given clear instructions to go to Emergency Department or return to medical center if symptoms don't improve, worsen, or new problems develop.The patient verbalized understanding.   Orders Placed This Encounter  Procedures   Ambulatory referral to Cardiology   Follow-up with primary provider as scheduled.  Senaida Dama, NP

## 2023-11-04 NOTE — Progress Notes (Signed)
 Per patient she had a shingles and flu vaccine on 07/07/2023 however it was not document.  I gave patient 2 shingle shot today

## 2023-11-07 ENCOUNTER — Ambulatory Visit: Admitting: Orthopedic Surgery

## 2023-11-07 DIAGNOSIS — M7501 Adhesive capsulitis of right shoulder: Secondary | ICD-10-CM

## 2023-11-07 DIAGNOSIS — M75121 Complete rotator cuff tear or rupture of right shoulder, not specified as traumatic: Secondary | ICD-10-CM | POA: Diagnosis not present

## 2023-11-09 ENCOUNTER — Encounter: Payer: Self-pay | Admitting: Orthopedic Surgery

## 2023-11-09 NOTE — Progress Notes (Signed)
 Office Visit Note   Patient: Maria Blackburn           Date of Birth: 1970-10-27           MRN: [id number] Visit Date: 11/07/2023 Requested by: Senaida Dama, NP [address] [place],  [place] [address] PCP: Senaida Dama, NP  Subjective: Chief Complaint  Patient presents with   Right Shoulder - Follow-up    HPI: Maria Blackburn is a 53 y.o. female who presents to the office reporting right shoulder pain and adhesive capsulitis.  Patient did have a glenohumeral joint injection on 09/28/2023.  That helped her pain about 50%.  She reports increased range of motion and decreased pain.  She is sleeping better now.  Doing a home exercise program of stretching.  She does have a 1 cm to 1-1/2 cm retracted rotator cuff tear with rounded edges which appear chronic.  Taking ibuprofen  for symptoms..                ROS: All systems reviewed are negative as they relate to the chief complaint within the history of present illness.  Patient denies fevers or chills.  Assessment & Plan: Visit Diagnoses:  1. Adhesive capsulitis of right shoulder   2. Nontraumatic complete tear of right rotator cuff     Plan: Impression is rotator cuff tear in a young patient who also had early adhesive capsulitis and frozen shoulder.  Glenohumeral injection performed on 09/28/2023.  That has helped her to get her motion back.  I still think that at the end of the day she has a fixable rotator cuff tear.  Would like for her to get about 6 weeks out more from this injection and then we can talk about surgical intervention.  Follow-up in 6 weeks with decision for or against surgical intervention at that time.  Based on the amount of retraction this is still a fixable tear but the more it retracts the more tension there will be on the repair in the future which is a negative prognostic factor for healing.  Follow-Up Instructions: No follow-ups on file.   Orders:  No orders of the defined types were placed in  this encounter.  No orders of the defined types were placed in this encounter.     Procedures: No procedures performed   Clinical Data: No additional findings.  Objective: Vital Signs: There were no vitals taken for this visit.  Physical Exam:  Constitutional: Patient appears well-developed HEENT:  Head: Normocephalic Eyes:EOM are normal Neck: Normal range of motion Cardiovascular: Normal rate Pulmonary/chest: Effort normal Neurologic: Patient is alert Skin: Skin is warm Psychiatric: Patient has normal mood and affect  Ortho Exam: Ortho exam demonstrates range of motion on the right of 30/95/170 range of motion on the left is 60/100/180.  Internal/external rotation strength at 15 degrees of abduction is pretty symmetric on both sides at 5 out of 5.  Does have some coarseness and popping which is mild in the right shoulder with internal and external rotation at 90 degrees of abduction.  No discrete AC joint tenderness.  Specialty Comments:  No specialty comments available.  Imaging: No results found.   PMFS History: Patient Active Problem List   Diagnosis Date Noted   PTSD (post-traumatic stress disorder) 10/22/2023   MDD (major depressive disorder), recurrent, severe, with psychosis (HCC) 10/21/2023   Acute psychosis (HCC) 09/29/2023   GAD (generalized anxiety disorder) 09/29/2023   MDD (major depressive disorder) 09/29/2023  Adhesive capsulitis of right shoulder 09/16/2023   Nontraumatic complete tear of right rotator cuff 09/16/2023   De Quervain's tenosynovitis, left 09/10/2021   Lateral epicondylitis, right elbow 09/10/2021   Allergy to environmental factors 10/07/2017   Past Medical History:  Diagnosis Date   Anxiety    Bladder spasms    Frequency of urination    GERD (gastroesophageal reflux disease)    Interstitial cystitis    Pelvic pain    SUI (stress urinary incontinence, female)    Urgency of urination     Family History  Problem Relation Age  of Onset   Heart disease Mother    Diabetes Son     Past Surgical History:  Procedure Laterality Date   ABDOMINAL HYSTERECTOMY  2000   ovaries remain   CYSTO WITH HYDRODISTENSION N/A 03/24/2017   Procedure: CYSTOSCOPY/HYDRODISTENSION INSTILLATION OF MARCAINE  AND PYRIDIUM ;  Surgeon: Homero Luster, MD;  Location: [facility];  Service: Urology;  Laterality: N/A;   HYSTEROSCOPY WITH D & C  1999   POLYPECTOMY   TUBAL LIGATION Bilateral 1996   Social History   Occupational History   Not on file  Tobacco Use   Smoking status: Never   Smokeless tobacco: Never  Vaping Use   Vaping status: Never Used  Substance and Sexual Activity   Alcohol use: No   Drug use: No   Sexual activity: Yes

## 2023-12-15 ENCOUNTER — Ambulatory Visit: Payer: Medicaid Other | Admitting: Urology

## 2023-12-19 ENCOUNTER — Encounter (HOSPITAL_COMMUNITY): Payer: Self-pay

## 2023-12-19 ENCOUNTER — Ambulatory Visit (INDEPENDENT_AMBULATORY_CARE_PROVIDER_SITE_OTHER): Payer: MEDICAID | Admitting: Orthopedic Surgery

## 2023-12-19 DIAGNOSIS — M75121 Complete rotator cuff tear or rupture of right shoulder, not specified as traumatic: Secondary | ICD-10-CM

## 2023-12-20 ENCOUNTER — Encounter: Payer: Self-pay | Admitting: Orthopedic Surgery

## 2023-12-20 NOTE — Progress Notes (Signed)
 Office Visit Note   Patient: Maria Blackburn           Date of Birth: 12-May-1971           MRN: [id number] Visit Date: 12/19/2023 Requested by: Senaida Dama, NP [address] [place],  [place] [address] PCP: Senaida Dama, NP  Subjective: Chief Complaint  Patient presents with   Right Shoulder - Follow-up    HPI: Maria Blackburn is a 53 y.o. female who presents to the office reporting right shoulder pain.  Glenohumeral injection was performed on 09/28/2023.  Patient wants to avoid surgery if possible.  Overall feeling some better.  Motion has improved some.  Doing daily stretching and resistance exercise band work.  Works at school part-time as a Scientist, physiological.  Has a 1-year-old nephew that they are taking care of.  Still hard to sleep sometimes with that right shoulder.  Husband is at home and they have 2 sons.  Patient does have a 1 and half centimeter retracted rotator cuff tear with rounded edges that appears chronic.  She is taking ibuprofen  for symptoms.              ROS: All systems reviewed are negative as they relate to the chief complaint within the history of present illness.  Patient denies fevers or chills.  Assessment & Plan: Visit Diagnoses:  1. Nontraumatic complete tear of right rotator cuff     Plan: Impression is rotator cuff tear with some restricted range of motion at the last clinic visit but now her range of motion has improved significantly.  She has gone and has a retracted full-thickness rotator cuff tear which is repairable.  I think her best bet for the long-term health and function of her shoulder would be the rotator cuff repair.  We got a poster for that surgery.  That would be arthroscopy with debridement biceps tenodesis and mini open rotator cuff tear repair.  The risk and benefits are discussed with the patient include not limited to infection or vessel damage shoulder stiffness incomplete pain length as well as incomplete return of function.   Patient understands risk and benefits and wishes to proceed.  All questions answered.  Follow-Up Instructions: No follow-ups on file.   Orders:  No orders of the defined types were placed in this encounter.  No orders of the defined types were placed in this encounter.     Procedures: No procedures performed   Clinical Data: No additional findings.  Objective: Vital Signs: There were no vitals taken for this visit.  Physical Exam:  Constitutional: Patient appears well-developed HEENT:  Head: Normocephalic Eyes:EOM are normal Neck: Normal range of motion Cardiovascular: Normal rate Pulmonary/chest: Effort normal Neurologic: Patient is alert Skin: Skin is warm Psychiatric: Patient has normal mood and affect  Ortho Exam: Ortho exam demonstrates full active and passive range of motion of the cervical spine.  On the right-hand side range of motion is 30/95/160.  Does have pretty good external rotation internal rotation strength at 15 degrees of abduction.  A little bit of coarseness with popping with internal/external rotation at 90 degrees abduction on the right.  No other masses lymphadenopathy or skin changes noted in the shoulder region  Specialty Comments:  No specialty comments available.  Imaging: No results found.   PMFS History: Patient Active Problem List   Diagnosis Date Noted   PTSD (post-traumatic stress disorder) 10/22/2023   MDD (major depressive disorder), recurrent, severe, with psychosis (HCC)  10/21/2023   Acute psychosis (HCC) 09/29/2023   GAD (generalized anxiety disorder) 09/29/2023   MDD (major depressive disorder) 09/29/2023   Adhesive capsulitis of right shoulder 09/16/2023   Nontraumatic complete tear of right rotator cuff 09/16/2023   De Quervain's tenosynovitis, left 09/10/2021   Lateral epicondylitis, right elbow 09/10/2021   Allergy to environmental factors 10/07/2017   Past Medical History:  Diagnosis Date   Anxiety    Bladder spasms     Frequency of urination    GERD (gastroesophageal reflux disease)    Interstitial cystitis    Pelvic pain    SUI (stress urinary incontinence, female)    Urgency of urination     Family History  Problem Relation Age of Onset   Heart disease Mother    Diabetes Son     Past Surgical History:  Procedure Laterality Date   ABDOMINAL HYSTERECTOMY  2000   ovaries remain   CYSTO WITH HYDRODISTENSION N/A 03/24/2017   Procedure: CYSTOSCOPY/HYDRODISTENSION INSTILLATION OF MARCAINE  AND PYRIDIUM ;  Surgeon: Homero Luster, MD;  Location: [facility];  Service: Urology;  Laterality: N/A;   HYSTEROSCOPY WITH D & C  1999   POLYPECTOMY   TUBAL LIGATION Bilateral 1996   Social History   Occupational History   Not on file  Tobacco Use   Smoking status: Never   Smokeless tobacco: Never  Vaping Use   Vaping status: Never Used  Substance and Sexual Activity   Alcohol use: No   Drug use: No   Sexual activity: Yes

## 2023-12-22 ENCOUNTER — Ambulatory Visit (HOSPITAL_COMMUNITY): Payer: MEDICAID | Admitting: Student

## 2023-12-22 DIAGNOSIS — F4310 Post-traumatic stress disorder, unspecified: Secondary | ICD-10-CM

## 2023-12-22 DIAGNOSIS — F333 Major depressive disorder, recurrent, severe with psychotic symptoms: Secondary | ICD-10-CM | POA: Diagnosis not present

## 2023-12-22 DIAGNOSIS — F411 Generalized anxiety disorder: Secondary | ICD-10-CM

## 2023-12-22 MED ORDER — SERTRALINE HCL 100 MG PO TABS: 200.0000 mg | ORAL_TABLET | Freq: Every day | ORAL | 3 refills | Status: DC

## 2023-12-22 MED ORDER — TRAZODONE HCL 50 MG PO TABS: 50.0000 mg | ORAL_TABLET | Freq: Every day | ORAL | 3 refills | Status: DC

## 2023-12-22 MED ORDER — ARIPIPRAZOLE 15 MG PO TABS: 15.0000 mg | ORAL_TABLET | Freq: Every day | ORAL | 3 refills | Status: DC

## 2023-12-23 NOTE — Progress Notes (Signed)
 BH MD Outpatient Progress Note  Date of visit: 12/22/2023 [name]  MRN:  [id number]  Assessment:  Maria Blackburn presents for follow-up evaluation.  She was previously seen by clinic nurse practitioner as a hospital follow-up.  History briefly described below.  Since our last appointment the patient reports that she has been doing well and full medication adherence.  No medication changes planned for today.  Patient given contact information for cardiology office to follow-up on referral there.  See below for further details on prolonged QT.  The patient will follow-up with a new psychiatric provider due to routine resident transition.  She will see Roslynn Coombes on 8/19.  She may be able to return to the care of Ardena Koyanagi, who did her initial evaluation at this clinic.  Brief summary of recent psychiatric history: The patient was hospitalized at [facility] a few months ago due to disturbing auditory hallucinations, which appeared to have been unusual in nature, unrecognizable voices saying they are going to kill you.  She was treated with risperidone  and Zoloft .  Psychiatric hospitalization again a few weeks later due to poor response to treatment and subsequent auditory hallucinations.  Diagnosis at that time MDD with psychotic features.  Switch to Abilify .  A few weeks later saw clinic nurse practitioner.  Diagnosis at that time PTSD and MDD with psychotic features.  Abilify  increased from 10 mg daily to 15 mg daily due to racing thoughts.  Overall it appeared the patient was doing fairly well.  UDS x 3 negative.    Identifying Information: Maria Blackburn is a 53 year old female with a history of MDD with psychotic features who is an established patient with [facility] for management of auditory hallucinations and recent psychiatric hospitalizations.   Plan:  # MDD with psychotic features Interventions: -- Continue Abilify  15 mg  daily - Continue trazodone  50 mg nightly - Continue Zoloft  200 mg daily  #Long term use of antipsychotic medication -- Lipid panel from 10/2023 - A1c from 10/2023 - See discussion of QT interval below and EKGs - No tardive movements noted on assessment today - No weight gain on Abilify  so far  # Prolonged QT interval - Noted on EKG from 4/21 at the emergency department.  QTc B noted to be in the high 600s.  Correction with alternative formula also yields a QTc in the 600s. - Previous EKGs reviewed.  EKG from 4/20 unremarkable.  EKG from 4/18 with QTc of 499.  EKG from 3/27, unremarkable. - Overall there is some concern for prolonged QT, though I do not think it is likely a QTc of 600 will be demonstrated on subsequent EKGs. - Primary care provider referred to cardiology.  It appears to still be pending.  I gave the patient the phone number to reach out to this clinic.   Patient was given contact information for behavioral health clinic and was instructed to call 911 for emergencies.   Subjective:  Chief Complaint:  Chief Complaint  Patient presents with   Follow-up    Interval History:  Social history briefly reviewed: Patient living in [place] with supportive husband who is retired.  2 adult sons in the home, 1 of whom has autism and require substantial support.  She reports that she is the primary caregiver for her 17-year-old nephew who has autism.  She says he is about to receive ABA therapy on an intensive basis.  She says that she is pursuing another bachelor's  degree in history.  She spends much of her time studying and taking care of 84-year-old child.  The patient feels that poor sleep habits helped cause her issues with auditory hallucinations.  She says that her sleep has been doing better recently because her husband is helping more with childcare.  She feels that her sleep patterns will continue to improve going forward and she is hopeful about this.  The patient says she  has not experienced any auditory hallucinations since her last hospitalization.  She reports her racing thoughts have improved with increased dose of Abilify .  She denies experiencing any thoughts of self-harm.  She feels her mood is okay.  Reviewed manic symptomatology.  It does not appear the patient has experienced a manic episode.  Patient denies recent menopausal symptoms.  Visit Diagnosis:    ICD-10-CM   1. PTSD (post-traumatic stress disorder)  F43.10 sertraline  (ZOLOFT ) 100 MG tablet    traZODone  (DESYREL ) 50 MG tablet    2. MDD (major depressive disorder), recurrent, severe, with psychosis (HCC)  F33.3 sertraline  (ZOLOFT ) 100 MG tablet    traZODone  (DESYREL ) 50 MG tablet    ARIPiprazole  (ABILIFY ) 15 MG tablet    3. GAD (generalized anxiety disorder)  F41.1 sertraline  (ZOLOFT ) 100 MG tablet    traZODone  (DESYREL ) 50 MG tablet      Past Psychiatric History: None prior to psychiatric hospitalizations  Past Medical History:  Past Medical History:  Diagnosis Date   Anxiety    Bladder spasms    Frequency of urination    GERD (gastroesophageal reflux disease)    Interstitial cystitis    Pelvic pain    SUI (stress urinary incontinence, female)    Urgency of urination     Past Surgical History:  Procedure Laterality Date   ABDOMINAL HYSTERECTOMY  2000   ovaries remain   CYSTO WITH HYDRODISTENSION N/A 03/24/2017   Procedure: CYSTOSCOPY/HYDRODISTENSION INSTILLATION OF MARCAINE  AND PYRIDIUM ;  Surgeon: Homero Luster, MD;  Location[facility];  Service: Urology;  Laterality: N/A;   HYSTEROSCOPY WITH D & C  1999   POLYPECTOMY   TUBAL LIGATION Bilateral 1996    Family Psychiatric History: None pertinent  Family History:  Family History  Problem Relation Age of Onset   Heart disease Mother    Diabetes Son     Social History:  Social History   Socioeconomic History   Marital status: Married    Spouse name: Not on file   Number of children: Not on file    Years of education: Not on file   Highest education level: Not on file  Occupational History   Not on file  Tobacco Use   Smoking status: Never   Smokeless tobacco: Never  Vaping Use   Vaping status: Never Used  Substance and Sexual Activity   Alcohol use: No   Drug use: No   Sexual activity: Yes  Other Topics Concern   Not on file  Social History Narrative   Not on file   Social Drivers of Health   Financial Resource Strain: Low Risk  (03/25/2023)   Overall Financial Resource Strain (CARDIA)    Difficulty of Paying Living Expenses: Not very hard  Food Insecurity: No Food Insecurity (10/21/2023)   Hunger Vital Sign    Worried About Running Out of Food in the Last Year: Never true    Ran Out of Food in the Last Year: Never true  Transportation Needs: No Transportation Needs (10/21/2023)   PRAPARE - Transportation  Lack of Transportation (Medical): No    Lack of Transportation (Non-Medical): No  Physical Activity: Patient Unable To Answer (03/25/2023)   Exercise Vital Sign    Days of Exercise per Week: Patient unable to answer    Minutes of Exercise per Session: Patient unable to answer  Stress: Patient Unable To Answer (03/25/2023)   [employer] of Occupational Health - Occupational Stress Questionnaire    Feeling of Stress : Patient unable to answer  Social Connections: Socially Integrated (10/21/2023)   Social Connection and Isolation Panel    Frequency of Communication with Friends and Family: More than three times a week    Frequency of Social Gatherings with Friends and Family: More than three times a week    Attends Religious Services: More than 4 times per year    Active Member of [employer] or Organizations: Yes    Attends Engineer, structural: More than 4 times per year    Marital Status: Married    Allergies: No Known Allergies  Current Medications: Current Outpatient Medications  Medication Sig Dispense Refill   ARIPiprazole  (ABILIFY ) 15 MG tablet  Take 1 tablet (15 mg total) by mouth daily. 30 tablet 3   atorvastatin  (LIPITOR) 20 MG tablet Take 1 tablet (20 mg total) by mouth daily. 30 tablet 0   sertraline  (ZOLOFT ) 100 MG tablet Take 2 tablets (200 mg total) by mouth daily. 60 tablet 3   solifenacin  (VESICARE ) 10 MG tablet Take 1 tablet (10 mg total) by mouth daily. (Patient taking differently: Take 5 mg by mouth daily.) 30 tablet 11   traZODone  (DESYREL ) 50 MG tablet Take 1 tablet (50 mg total) by mouth at bedtime. 30 tablet 3   No current facility-administered medications for this visit.     Objective:  Psychiatric Specialty Exam: Physical Exam Constitutional:      Appearance: the patient is not toxic-appearing.  Pulmonary:     Effort: Pulmonary effort is normal.  Neurological:     General: No focal deficit present.     Mental Status: the patient is alert and oriented to person, place, and time.   Review of Systems  Respiratory:  Negative for shortness of breath.   Cardiovascular:  Negative for chest pain.  Gastrointestinal:  Negative for abdominal pain, constipation, diarrhea, nausea and vomiting.  Neurological:  Negative for headaches.      Wt 182 lb (82.6 kg)   BMI 29.38 kg/m   General Appearance: Fairly Groomed  Eye Contact:  Good  Speech:  Clear and Coherent  Volume:  Normal  Mood:  Euthymic  Affect:  Congruent  Thought Process:  Coherent  Orientation:  Full (Time, Place, and Person)  Thought Content: Logical   Suicidal Thoughts:  No  Homicidal Thoughts:  No  Memory:  Immediate;   Good  Judgement:  fair  Insight:  fair  Psychomotor Activity:  Normal  Concentration:  Concentration: Good  Recall:  Good  Fund of Knowledge: Good  Language: Good  Akathisia:  No  Handed:    AIMS (if indicated): not done  Assets:  Communication Skills Desire for Improvement Financial Resources/Insurance Housing Leisure Time Physical Health  ADL's:  Intact  Cognition: WNL       Metabolic Disorder Labs: Lab  Results  Component Value Date   HGBA1C 5.9 (H) 07/07/2023   No results found for: PROLACTIN Lab Results  Component Value Date   CHOL 247 (H) 08/12/2023   TRIG 135 08/12/2023   HDL 54 08/12/2023   CHOLHDL  4.6 (H) 08/12/2023   VLDL 18 03/20/2016   LDLCALC 169 (H) 08/12/2023   LDLCALC 147 (H) 07/07/2023   Lab Results  Component Value Date   TSH 0.964 10/05/2023   TSH 0.262 (L) 09/29/2023    Therapeutic Level Labs: No results found for: LITHIUM No results found for: VALPROATE No results found for: CBMZ  Screenings: AUDIT    Flowsheet Row [facility]  Alcohol Use Disorder Identification Test Final Score (AUDIT) 0 0   GAD-7    Flowsheet Row Office Visit from 11/04/2023 in [facility] Office Visit from 10/31/2023 in [facility] Office Visit from 02/02/2023 in [facility] at [facility]  Total GAD-7 Score 0 3 1   PHQ2-9    Flowsheet Row Office Visit from 11/04/2023 in [facility] at [facility] Office Visit from 10/31/2023 in [facility] ED from 09/29/2023 in [facility] at [facility] Office Visit from 04/04/2023 in [facility] at [facility] Office Visit from 02/02/2023 in [facility] at [facility]  PHQ-2 Total Score 0 1 2 4 4   PHQ-9 Total Score 3 4 9 12 7    Flowsheet Row Office Visit from 10/31/2023 in [facility] Admission (Discharged) [facility] from 10/20/2023 in [facility] Department at [facility]  C-SSRS RISK CATEGORY No Risk No Risk No Risk    Collaboration of Care: none  A total of 30 minutes was spent involved in face to face  clinical care, chart review, documentation.   Marilou Showman, MD 12/23/2023, 10:44 AM

## 2023-12-27 ENCOUNTER — Ambulatory Visit (INDEPENDENT_AMBULATORY_CARE_PROVIDER_SITE_OTHER): Payer: MEDICAID | Admitting: Clinical

## 2023-12-27 DIAGNOSIS — F4310 Post-traumatic stress disorder, unspecified: Secondary | ICD-10-CM

## 2023-12-27 DIAGNOSIS — F333 Major depressive disorder, recurrent, severe with psychotic symptoms: Secondary | ICD-10-CM
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
## 2024-01-19 ENCOUNTER — Ambulatory Visit (INDEPENDENT_AMBULATORY_CARE_PROVIDER_SITE_OTHER): Payer: MEDICAID | Admitting: Urology

## 2024-01-19 ENCOUNTER — Encounter: Payer: Self-pay | Admitting: Urology

## 2024-01-19 VITALS — BP 113/73 | HR 73 | Ht 67.0 in | Wt 171.0 lb

## 2024-01-19 DIAGNOSIS — N3281 Overactive bladder: Secondary | ICD-10-CM | POA: Diagnosis not present

## 2024-01-19 DIAGNOSIS — R350 Frequency of micturition: Secondary | ICD-10-CM

## 2024-01-19 DIAGNOSIS — N3010 Interstitial cystitis (chronic) without hematuria: Secondary | ICD-10-CM | POA: Diagnosis not present

## 2024-01-19 LAB — URINALYSIS, ROUTINE W REFLEX MICROSCOPIC
Bilirubin, UA: NEGATIVE
Glucose, UA: NEGATIVE
Ketones, UA: NEGATIVE
Nitrite, UA: NEGATIVE
Protein,UA: NEGATIVE
RBC, UA: NEGATIVE
Specific Gravity, UA: 1.015 (ref 1.005–1.030)
Urobilinogen, Ur: 1 mg/dL (ref 0.2–1.0)
pH, UA: 6.5 (ref 5.0–7.5)

## 2024-01-19 LAB — MICROSCOPIC EXAMINATION

## 2024-01-19 MED ORDER — SOLIFENACIN SUCCINATE 10 MG PO TABS: 10.0000 mg | ORAL_TABLET | Freq: Every day | ORAL | 11 refills | Status: AC

## 2024-01-19 NOTE — Progress Notes (Signed)
 Assessment: 1. OAB (overactive bladder)   2. Urinary frequency   3. Interstitial cystitis     Plan: Continue bladder diet Continue Solifenacin  10 mg every other day.   Return to office in 1 year  Chief Complaint:  Chief Complaint  Patient presents with   Over Active Bladder    History of Present Illness:  Maria Blackburn is a 53 y.o. female who is seen for further evaluation of urinary frequency, OAB, and interstitial cystitis.   She has a long history of urinary symptoms including frequency, urgency, nocturia, and bladder pain.  She was previously followed by Dr. Alline and subsequently Dr. Watt for a diagnosis of interstitial cystitis.  She underwent cystoscopy with hydrodistention in September 2018.  Review of the operative note indicates that she had a bladder capacity of 750 mL and had glomerulations noted after hydrodistention.  She noted some improvement in her symptoms following this procedure.  She was subsequently referred for pelvic floor physical therapy but did not pursue this treatment.  She was also placed on hydroxyzine .  She has previously received intravesical therapy for IC. She had not been seen by urology for the past 5 years.  She continued to have significant urinary frequency, voiding multiple times per hour, urgency, bladder pain, nocturia every 1-2 hours, incontinence, decreased stream, and sensation of incomplete emptying.  She  noted some further worsening of her symptoms within the past 2 months.  She had been treated for UTIs without improvement in her bladder symptoms.  U/A from 03/25/23:  negative blood, negative LE, + nitrite She was treated with Macrobid . She continued with symptoms and was placed on Bactrim . She was seen on 04/04/23 for continued UTI symptoms and placed on Augmentin . U/A was negative for blood, negative for LE, and nitrite + Urine culture from 9/30//2024 showed no growth.  She was on Augmentin  without any change in her symptoms.  No  gross hematuria or flank pain. She was given a trial of Gemtesa  75 mg daily at her visit in 10/24. She reported improvement in her urinary symptoms with the Gemtesa .  No side effects.   She was given a trial of Solifenacin  10 mg daily in 11/24.  At her visit in February 2025, she continued on Solifenacin  10 mg daily.  She reported improvement in her frequency, urgency, nocturia, incontinence, and bladder discomfort with the medication.  She was having occasional urgency and hesitancy.  No problem with bladder emptying.  No dysuria or gross hematuria.  She noted a dry mouth with the medication.  She was taking the medication at night.  She returns today for follow-up.  She is currently doing very well from a urinary standpoint.  She continues on solifenacin , taking this every other day.  She is not having any significant side effects.  She has noted improvement in her voiding symptoms.  She is currently very pleased.  Portions of the above documentation were copied from a prior visit for review purposes only.   Past Medical History:  Past Medical History:  Diagnosis Date   Anxiety    Bladder spasms    Frequency of urination    GERD (gastroesophageal reflux disease)    Interstitial cystitis    Pelvic pain    SUI (stress urinary incontinence, female)    Urgency of urination     Past Surgical History:  Past Surgical History:  Procedure Laterality Date   ABDOMINAL HYSTERECTOMY  2000   ovaries remain   CYSTO WITH HYDRODISTENSION N/A  03/24/2017   Procedure: CYSTOSCOPY/HYDRODISTENSION INSTILLATION OF MARCAINE  AND PYRIDIUM ;  Surgeon: Watt Rush, MD;  Location: [facility];  Service: Urology;  Laterality: N/A;   HYSTEROSCOPY WITH D & C  1999   POLYPECTOMY   TUBAL LIGATION Bilateral 1996    Allergies:  No Known Allergies  Family History:  Family History  Problem Relation Age of Onset   Heart disease Mother    Diabetes Son     Social History:  Social History    Tobacco Use   Smoking status: Never   Smokeless tobacco: Never  Vaping Use   Vaping status: Never Used  Substance Use Topics   Alcohol use: No   Drug use: No    ROS: Constitutional:  Negative for fever, chills, weight loss CV: Negative for chest pain, previous MI, hypertension Respiratory:  Negative for shortness of breath, wheezing, sleep apnea, frequent cough GI:  Negative for nausea, vomiting, bloody stool, GERD  Physical exam: BP 113/73   Pulse 73   Ht 5' 7 (1.702 m)   Wt 171 lb (77.6 kg)   BMI 26.78 kg/m  GENERAL APPEARANCE:  Well appearing, well developed, well nourished, NAD HEENT:  Atraumatic, normocephalic, oropharynx clear NECK:  Supple without lymphadenopathy or thyromegaly ABDOMEN:  Soft, non-tender, no masses EXTREMITIES:  Moves all extremities well, without clubbing, cyanosis, or edema NEUROLOGIC:  Alert and oriented x 3, normal gait, CN II-XII grossly intact MENTAL STATUS:  appropriate BACK:  Non-tender to palpation, No CVAT SKIN:  Warm, dry, and intact  Results: U/A: 0-5 WBCs, 0-2 RBCs

## 2024-01-31 ENCOUNTER — Ambulatory Visit (INDEPENDENT_AMBULATORY_CARE_PROVIDER_SITE_OTHER): Payer: MEDICAID | Admitting: Clinical

## 2024-01-31 DIAGNOSIS — F411 Generalized anxiety disorder: Secondary | ICD-10-CM | POA: Diagnosis not present

## 2024-01-31 NOTE — Progress Notes (Signed)
 Surgical Instructions   Your procedure is scheduled on Tuesday, August 5th, 2025. Report to [facility] Main Entrance A at 5:30 A.M., then check in with the Admitting office. Any questions or running late day of surgery: call [phone]  Questions prior to your surgery date: call [phone], Monday-Friday, 8am-4pm. If you experience any cold or flu symptoms such as cough, fever, chills, shortness of breath, etc. between now and your scheduled surgery, please notify us  at the above number.     Remember:  Do not eat after midnight the night before your surgery   You may drink clear liquids until 4:30 the morning of your surgery.   Clear liquids allowed are: Water , Non-Citrus Juices (without pulp), Carbonated Beverages, Clear Tea (no milk, honey, etc.), Black Coffee Only (NO MILK, CREAM OR POWDERED CREAMER of any kind), and Gatorade.  Patient Instructions  The night before surgery:  No food after midnight. ONLY clear liquids after midnight  The day of surgery (if you do NOT have diabetes):  Drink ONE (1) Pre-Surgery Clear Ensure by 4:30 the morning of surgery. Drink in one sitting. Do not sip.  This drink was given to you during your hospital  pre-op appointment visit.  Nothing else to drink after completing the  Pre-Surgery Clear Ensure.          If you have questions, please contact your surgeon's office.     Take these medicines the morning of surgery with A SIP OF WATER : Aripiprazole  (Abilify ) Atorvastatin  (Lipitor) Sertraline  (Zoloft ) Solifenacin  (Vesicare )   May take these medicines IF NEEDED: None.     One week prior to surgery, STOP taking any Aspirin (unless otherwise instructed by your surgeon) Aleve , Naproxen , Ibuprofen , Motrin , Advil , Goody's, BC's, all herbal medications, fish oil, and non-prescription vitamins.                     Do NOT Smoke (Tobacco/Vaping) for 24 hours prior to your procedure.  If you use a CPAP at night, you may bring your  mask/headgear for your overnight stay.   You will be asked to remove any contacts, glasses, piercing's, hearing aid's, dentures/partials prior to surgery. Please bring cases for these items if needed.    Patients discharged the day of surgery will not be allowed to drive home, and someone needs to stay with them for 24 hours.  SURGICAL WAITING ROOM VISITATION Patients may have no more than 2 support people in the waiting area - these visitors may rotate.   Pre-op nurse will coordinate an appropriate time for 1 ADULT support person, who may not rotate, to accompany patient in pre-op.  Children under the age of 33 must have an adult with them who is not the patient and must remain in the main waiting area with an adult.  If the patient needs to stay at the hospital during part of their recovery, the visitor guidelines for inpatient rooms apply.  Please refer to the [facility] website for the visitor guidelines for any additional information.   If you received a COVID test during your pre-op visit  it is requested that you wear a mask when out in public, stay away from anyone that may not be feeling well and notify your surgeon if you develop symptoms. If you have been in contact with anyone that has tested positive in the last 10 days please notify you surgeon.      Pre-operative CHG Bathing Instructions   You can play a key role in  reducing the risk of infection after surgery. Your skin needs to be as free of germs as possible. You can reduce the number of germs on your skin by washing with CHG (chlorhexidine gluconate) soap before surgery. CHG is an antiseptic soap that kills germs and continues to kill germs even after washing.   DO NOT use if you have an allergy to chlorhexidine/CHG or antibacterial soaps. If your skin becomes reddened or irritated, stop using the CHG and notify one of our RNs at [phone].              TAKE A SHOWER THE NIGHT BEFORE SURGERY AND THE DAY OF SURGERY     Please keep in mind the following:  DO NOT shave, including legs and underarms, 48 hours prior to surgery.   You may shave your face before/day of surgery.  Place clean sheets on your bed the night before surgery Use a clean washcloth (not used since being washed) for each shower. DO NOT sleep with pet's night before surgery.  CHG Shower Instructions:  Wash your face and private area with normal soap. If you choose to wash your hair, wash first with your normal shampoo.  After you use shampoo/soap, rinse your hair and body thoroughly to remove shampoo/soap residue.  Turn the water  OFF and apply half the bottle of CHG soap to a CLEAN washcloth.  Apply CHG soap ONLY FROM YOUR NECK DOWN TO YOUR TOES (washing for 3-5 minutes)  DO NOT use CHG soap on face, private areas, open wounds, or sores.  Pay special attention to the area where your surgery is being performed.  If you are having back surgery, having someone wash your back for you may be helpful. Wait 2 minutes after CHG soap is applied, then you may rinse off the CHG soap.  Pat dry with a clean towel  Put on clean pajamas    Additional instructions for the day of surgery: DO NOT APPLY any lotions, deodorants, cologne, or perfumes.   Do not wear jewelry or makeup Do not wear nail polish, gel polish, artificial nails, or any other type of covering on natural nails (fingers and toes) Do not bring valuables to the hospital. [facility] is not responsible for valuables/personal belongings. Put on clean/comfortable clothes.  Please brush your teeth.  Ask your nurse before applying any prescription medications to the skin.

## 2024-01-31 NOTE — Progress Notes (Unsigned)
   THERAPIST PROGRESS NOTE  Session Time: 30 min  Participation Level: Active  Behavioral Response: CasualAlertEuthymic  Type of Therapy: Individual Therapy  Treatment Goals addressed: Client will engage in at least 80% of scheduled individual psychotherapy sessions  ProgressTowards Goals: Progressing  Interventions: CBT and Supportive  Summary:  Maria Blackburn is a 53 y.o. female who presents for the scheduled appointment oriented times five, appropriately dressed and friendly. Client denied hallucinations and delusions. Client reported she is doing pretty well today. Client reported she has to have surgery to fix her rotary cuff. Client reported she is anxious about the procedure. Client reported she has been working on her self confidence. Client reported she bought herself a new outfit. Client reported she will be seeing a friend this week to go out to lunch. Client reported she has also been helping her sons with their projects. Client reported she has been working on loving herself. Client reported she also went to the beach since the last appointment. Client reported it was nice to get away. Client reported she has been thinking about reaching out to her niece, her nephews mom. Evidence of progress towards goal: Client reported 2 positives of being able to work through anxiety with family issues as well as doing enjoyable activities to help improve her self-confidence.  Suicidal/Homicidal: Nowithout intent/plan  Therapist Response:  Therapist began the appointment asking the client how she has been doing since last seen. Therapist engaged with active listening and positive emotional support. Therapist used CBT to engage and asked client about severity of any negative emotions and contributing factors. Therapist used CBT to normalize the clients thoughts and feelings. Therapist used CBT to collaborate with the client and brainstormed the pros and cons of her ideas and how to handle  certain family issues . Therapist used CBT ask the client to identify her progress with frequency of use with coping skills with continued practice in her daily activity.      Plan: Return again in 4 weeks.  Diagnosis:gad  Collaboration of Care: Patient refused AEB none requested by the client.  Patient/Guardian was advised Release of Information must be obtained prior to any record release in order to collaborate their care with an outside provider. Patient/Guardian was advised if they have not already done so to contact the registration department to sign all necessary forms in order for us  to release information regarding their care.   Consent: Patient/Guardian gives verbal consent for treatment and assignment of benefits for services provided during this visit. Patient/Guardian expressed understanding and agreed to proceed.   Camri Molloy Y Vernona Peake, LCSW 01/31/2024

## 2024-02-01 ENCOUNTER — Encounter (HOSPITAL_COMMUNITY): Payer: Self-pay

## 2024-02-01 ENCOUNTER — Other Ambulatory Visit: Payer: Self-pay

## 2024-02-01 ENCOUNTER — Encounter (HOSPITAL_COMMUNITY)
Admission: RE | Admit: 2024-02-01 | Discharge: 2024-02-01 | Disposition: A | Discharge status: Home / Self Care | Payer: MEDICAID | Source: Ambulatory Visit | Attending: Orthopedic Surgery | Admitting: Orthopedic Surgery

## 2024-02-01 DIAGNOSIS — Z01812 Encounter for preprocedural laboratory examination: Secondary | ICD-10-CM | POA: Diagnosis present

## 2024-02-01 DIAGNOSIS — Z01818 Encounter for other preprocedural examination: Secondary | ICD-10-CM

## 2024-02-01 LAB — BASIC METABOLIC PANEL WITH GFR
Anion gap: 8 (ref 5–15)
BUN: 16 mg/dL (ref 6–20)
CO2: 28 mmol/L (ref 22–32)
Calcium: 9.1 mg/dL (ref 8.9–10.3)
Chloride: 104 mmol/L (ref 98–111)
Creatinine, Ser: 0.71 mg/dL (ref 0.44–1.00)
GFR, Estimated: >60 mL/min (ref >60)
Glucose, Bld: 98 mg/dL (ref 70–99)
Potassium: 3.9 mmol/L (ref 3.5–5.1)
Sodium: 140 mmol/L (ref 135–145)

## 2024-02-01 LAB — CBC
HCT: 41.2 % (ref 36.0–46.0)
Hemoglobin: 13.2 g/dL (ref 12.0–15.0)
MCH: 29.8 pg (ref 26.0–34.0)
MCHC: 32 g/dL (ref 30.0–36.0)
MCV: 93 fL (ref 80.0–100.0)
Platelets: 272 K/uL (ref 150–400)
RBC: 4.43 MIL/uL (ref 3.87–5.11)
RDW: 13.7 % (ref 11.5–15.5)
WBC: 5.9 K/uL (ref 4.0–10.5)
nRBC: 0 % (ref 0.0–0.2)

## 2024-02-01 NOTE — Progress Notes (Signed)
 PCP - Greig Drones, NP Cardiologist -   PPM/ICD - denies Device Orders - na Rep Notified - na  Chest x-ray - 10/05/2023 EKG - 10/20/2023 Stress Test -  ECHO -   Cardiac Cath -   Sleep Study - denies CPAP - na  Non-diabetic  Blood Thinner Instructions: denies Aspirin Instructions:denies  ERAS Protcol - Ensure until 0430  Anesthesia review: No  Patient denies shortness of breath, fever, cough and chest pain at PAT appointment   All instructions explained to the patient, with a verbal understanding of the material. Patient agrees to go over the instructions while at home for a better understanding. Patient also instructed to self quarantine after being tested for COVID-19. The opportunity to ask questions was provided.

## 2024-02-06 NOTE — Anesthesia Preprocedure Evaluation (Signed)
 Anesthesia Evaluation  Patient identified by MRN, date of birth, ID band Patient awake    Reviewed: Allergy & Precautions, NPO status , Patient's Chart, lab work & pertinent test results  Airway Mallampati: II  TM Distance: >3 FB Neck ROM: Full    Dental no notable dental hx. (+) Teeth Intact, Dental Advisory Given   Pulmonary neg pulmonary ROS   Pulmonary exam normal breath sounds clear to auscultation       Cardiovascular Normal cardiovascular exam Rhythm:Regular Rate:Normal     Neuro/Psych  PSYCHIATRIC DISORDERS Anxiety Depression    PTSD   GI/Hepatic ,GERD  ,,  Endo/Other  negative endocrine ROS    Renal/GU      Musculoskeletal  (+) Arthritis ,    Abdominal   Peds  Hematology Lab Results      Component                Value               Date                                HGB                      13.2                02/01/2024                HCT                      41.2                02/01/2024                  PLT                      272                 02/01/2024              Anesthesia Other Findings   Reproductive/Obstetrics                              Anesthesia Physical Anesthesia Plan  ASA: 2  Anesthesia Plan: General and Regional   Post-op Pain Management: Regional block*, Precedex  and Tylenol  PO (pre-op)*   Induction: Intravenous  PONV Risk Score and Plan: 4 or greater and Treatment may vary due to age or medical condition, Midazolam , Dexamethasone  and Ondansetron   Airway Management Planned: Oral ETT  Additional Equipment: None  Intra-op Plan:   Post-operative Plan: Extubation in OR  Informed Consent:      Dental advisory given  Plan Discussed with: CRNA and Surgeon  Anesthesia Plan Comments: (GA w R ISB check )         Anesthesia Quick Evaluation

## 2024-02-07 ENCOUNTER — Ambulatory Visit (HOSPITAL_COMMUNITY): Payer: MEDICAID | Admitting: Anesthesiology

## 2024-02-07 ENCOUNTER — Encounter (HOSPITAL_COMMUNITY)
Admission: RE | Disposition: A | Discharge status: Home / Self Care | Payer: Self-pay | Source: Home / Self Care | Attending: Orthopedic Surgery

## 2024-02-07 ENCOUNTER — Other Ambulatory Visit (HOSPITAL_COMMUNITY): Payer: Self-pay

## 2024-02-07 ENCOUNTER — Ambulatory Visit (HOSPITAL_BASED_OUTPATIENT_CLINIC_OR_DEPARTMENT_OTHER): Payer: MEDICAID | Admitting: Anesthesiology

## 2024-02-07 ENCOUNTER — Encounter (HOSPITAL_COMMUNITY): Payer: Self-pay | Admitting: Orthopedic Surgery

## 2024-02-07 ENCOUNTER — Ambulatory Visit (HOSPITAL_COMMUNITY)
Admission: RE | Admit: 2024-02-07 | Discharge: 2024-02-07 | Disposition: A | Discharge status: Home / Self Care | Payer: MEDICAID | Attending: Orthopedic Surgery | Admitting: Orthopedic Surgery

## 2024-02-07 ENCOUNTER — Other Ambulatory Visit: Payer: Self-pay

## 2024-02-07 DIAGNOSIS — F4310 Post-traumatic stress disorder, unspecified: Secondary | ICD-10-CM | POA: Insufficient documentation

## 2024-02-07 DIAGNOSIS — M75101 Unspecified rotator cuff tear or rupture of right shoulder, not specified as traumatic: Secondary | ICD-10-CM

## 2024-02-07 DIAGNOSIS — K219 Gastro-esophageal reflux disease without esophagitis: Secondary | ICD-10-CM | POA: Diagnosis not present

## 2024-02-07 DIAGNOSIS — M7521 Bicipital tendinitis, right shoulder: Secondary | ICD-10-CM

## 2024-02-07 DIAGNOSIS — X58XXXA Exposure to other specified factors, initial encounter: Secondary | ICD-10-CM | POA: Diagnosis not present

## 2024-02-07 DIAGNOSIS — M75121 Complete rotator cuff tear or rupture of right shoulder, not specified as traumatic: Secondary | ICD-10-CM

## 2024-02-07 DIAGNOSIS — M65911 Unspecified synovitis and tenosynovitis, right shoulder: Secondary | ICD-10-CM

## 2024-02-07 DIAGNOSIS — S43431A Superior glenoid labrum lesion of right shoulder, initial encounter: Secondary | ICD-10-CM

## 2024-02-07 DIAGNOSIS — F32A Depression, unspecified: Secondary | ICD-10-CM | POA: Diagnosis not present

## 2024-02-07 DIAGNOSIS — F419 Anxiety disorder, unspecified: Secondary | ICD-10-CM | POA: Diagnosis not present

## 2024-02-07 DIAGNOSIS — Z01818 Encounter for other preprocedural examination: Secondary | ICD-10-CM

## 2024-02-07 HISTORY — PX: BICEPT TENODESIS: SHX5116

## 2024-02-07 HISTORY — PX: SHOULDER OPEN ROTATOR CUFF REPAIR: SHX2407

## 2024-02-07 HISTORY — PX: POSTERIOR LUMBAR FUSION 2 WITH HARDWARE REMOVAL: SHX7297

## 2024-02-07 SURGERY — ARTHROSCOPY, SHOULDER WITH DEBRIDEMENT
Anesthesia: Regional | Site: Shoulder | Laterality: Right

## 2024-02-07 MED ORDER — DEXMEDETOMIDINE HCL IN NACL 80 MCG/20ML IV SOLN
INTRAVENOUS | Status: AC
Start: 2024-02-07 — End: 2024-02-07
  Filled 2024-02-07: qty 20

## 2024-02-07 MED ORDER — OXYCODONE HCL 5 MG PO TABS: 5.0000 mg | ORAL_TABLET | Freq: Once | ORAL | Status: DC | PRN

## 2024-02-07 MED ORDER — DEXAMETHASONE SODIUM PHOSPHATE 10 MG/ML IJ SOLN
INTRAMUSCULAR | Status: DC | PRN
  Administered 2024-02-07: 10 mg via INTRAVENOUS

## 2024-02-07 MED ORDER — PHENYLEPHRINE 80 MCG/ML (10ML) SYRINGE FOR IV PUSH (FOR BLOOD PRESSURE SUPPORT)
PREFILLED_SYRINGE | INTRAVENOUS | Status: AC
  Filled 2024-02-07: qty 10

## 2024-02-07 MED ORDER — CHLORHEXIDINE GLUCONATE 0.12 % MT SOLN
15.0000 mL | Freq: Once | OROMUCOSAL | Status: AC
  Administered 2024-02-07: 15 mL via OROMUCOSAL
  Filled 2024-02-07: qty 15

## 2024-02-07 MED ORDER — FENTANYL CITRATE (PF) 250 MCG/5ML IJ SOLN
INTRAMUSCULAR | Status: AC
  Filled 2024-02-07: qty 5

## 2024-02-07 MED ORDER — PROPOFOL 10 MG/ML IV BOLUS
INTRAVENOUS | Status: AC
  Filled 2024-02-07: qty 20

## 2024-02-07 MED ORDER — EPHEDRINE SULFATE-NACL 50-0.9 MG/10ML-% IV SOSY
PREFILLED_SYRINGE | INTRAVENOUS | Status: DC | PRN
  Administered 2024-02-07 (×4): 5 mg via INTRAVENOUS

## 2024-02-07 MED ORDER — DEXMEDETOMIDINE HCL IN NACL 80 MCG/20ML IV SOLN
INTRAVENOUS | Status: DC | PRN
  Administered 2024-02-07: 8 ug via INTRAVENOUS

## 2024-02-07 MED ORDER — FENTANYL CITRATE (PF) 250 MCG/5ML IJ SOLN
INTRAMUSCULAR | Status: DC | PRN
  Administered 2024-02-07: 50 ug via INTRAVENOUS

## 2024-02-07 MED ORDER — ROCURONIUM BROMIDE 10 MG/ML (PF) SYRINGE
PREFILLED_SYRINGE | INTRAVENOUS | Status: DC | PRN
  Administered 2024-02-07: 60 mg via INTRAVENOUS
  Administered 2024-02-07: 10 mg via INTRAVENOUS

## 2024-02-07 MED ORDER — EPHEDRINE 5 MG/ML INJ
INTRAVENOUS | Status: AC
Start: 2024-02-07 — End: 2024-02-07
  Filled 2024-02-07: qty 5

## 2024-02-07 MED ORDER — ROPIVACAINE HCL 5 MG/ML IJ SOLN
INTRAMUSCULAR | Status: DC | PRN
  Administered 2024-02-07: 30 mL via PERINEURAL

## 2024-02-07 MED ORDER — TRANEXAMIC ACID-NACL 1000-0.7 MG/100ML-% IV SOLN
1000.0000 mg | INTRAVENOUS | Status: AC
  Administered 2024-02-07: 1000 mg via INTRAVENOUS
  Filled 2024-02-07: qty 100

## 2024-02-07 MED ORDER — ONDANSETRON HCL 4 MG/2ML IJ SOLN
INTRAMUSCULAR | Status: DC | PRN
  Administered 2024-02-07: 4 mg via INTRAVENOUS

## 2024-02-07 MED ORDER — ONDANSETRON HCL 4 MG/2ML IJ SOLN: 4.0000 mg | Freq: Once | INTRAMUSCULAR | Status: DC | PRN

## 2024-02-07 MED ORDER — SODIUM CHLORIDE 0.9 % IR SOLN
Status: DC | PRN
  Administered 2024-02-07 (×2): 3000 mL

## 2024-02-07 MED ORDER — PROPOFOL 10 MG/ML IV BOLUS
INTRAVENOUS | Status: DC | PRN
  Administered 2024-02-07: 130 mg via INTRAVENOUS

## 2024-02-07 MED ORDER — PHENYLEPHRINE 80 MCG/ML (10ML) SYRINGE FOR IV PUSH (FOR BLOOD PRESSURE SUPPORT)
PREFILLED_SYRINGE | INTRAVENOUS | Status: DC | PRN
  Administered 2024-02-07 (×12): 80 ug via INTRAVENOUS

## 2024-02-07 MED ORDER — OXYCODONE HCL 5 MG PO TABS
5.0000 mg | ORAL_TABLET | ORAL | 0 refills | Status: DC | PRN
  Filled 2024-02-07: qty 30, 5d supply, fill #0

## 2024-02-07 MED ORDER — ROCURONIUM BROMIDE 10 MG/ML (PF) SYRINGE
PREFILLED_SYRINGE | INTRAVENOUS | Status: AC
  Filled 2024-02-07: qty 10

## 2024-02-07 MED ORDER — HYDROMORPHONE HCL 1 MG/ML IJ SOLN: 0.2500 mg | INTRAMUSCULAR | Status: DC | PRN

## 2024-02-07 MED ORDER — FENTANYL CITRATE (PF) 250 MCG/5ML IJ SOLN: INTRAMUSCULAR | Status: DC | PRN

## 2024-02-07 MED ORDER — MIDAZOLAM HCL 2 MG/2ML IJ SOLN
INTRAMUSCULAR | Status: AC
  Filled 2024-02-07: qty 2

## 2024-02-07 MED ORDER — ONDANSETRON HCL 4 MG/2ML IJ SOLN
INTRAMUSCULAR | Status: AC
  Filled 2024-02-07: qty 2

## 2024-02-07 MED ORDER — LIDOCAINE 2% (20 MG/ML) 5 ML SYRINGE
INTRAMUSCULAR | Status: AC
  Filled 2024-02-07: qty 5

## 2024-02-07 MED ORDER — 0.9 % SODIUM CHLORIDE (POUR BTL) OPTIME
TOPICAL | Status: DC | PRN
  Administered 2024-02-07: 1000 mL

## 2024-02-07 MED ORDER — LACTATED RINGERS IV SOLN: INTRAVENOUS | Status: DC

## 2024-02-07 MED ORDER — AMISULPRIDE (ANTIEMETIC) 5 MG/2ML IV SOLN: 10.0000 mg | Freq: Once | INTRAVENOUS | Status: DC | PRN

## 2024-02-07 MED ORDER — ORAL CARE MOUTH RINSE: 15.0000 mL | Freq: Once | OROMUCOSAL | Status: AC

## 2024-02-07 MED ORDER — MIDAZOLAM HCL 2 MG/2ML IJ SOLN
INTRAMUSCULAR | Status: DC | PRN
  Administered 2024-02-07: 2 mg via INTRAVENOUS

## 2024-02-07 MED ORDER — CEFAZOLIN SODIUM-DEXTROSE 2-4 GM/100ML-% IV SOLN
2.0000 g | INTRAVENOUS | Status: AC
  Administered 2024-02-07: 2 g via INTRAVENOUS
  Filled 2024-02-07: qty 100

## 2024-02-07 MED ORDER — DEXAMETHASONE SODIUM PHOSPHATE 10 MG/ML IJ SOLN
INTRAMUSCULAR | Status: AC
  Filled 2024-02-07: qty 1

## 2024-02-07 MED ORDER — CLONIDINE HCL (ANALGESIA) 100 MCG/ML EP SOLN
EPIDURAL | Status: DC | PRN
  Administered 2024-02-07: 100 ug

## 2024-02-07 MED ORDER — METHOCARBAMOL 500 MG PO TABS
500.0000 mg | ORAL_TABLET | Freq: Three times a day (TID) | ORAL | 1 refills | Status: DC | PRN
  Filled 2024-02-07: qty 30, 10d supply, fill #0

## 2024-02-07 MED ORDER — SUGAMMADEX SODIUM 200 MG/2ML IV SOLN
INTRAVENOUS | Status: DC | PRN
  Administered 2024-02-07: 50 mg via INTRAVENOUS
  Administered 2024-02-07: 150 mg via INTRAVENOUS

## 2024-02-07 MED ORDER — CELECOXIB 100 MG PO CAPS
100.0000 mg | ORAL_CAPSULE | Freq: Every day | ORAL | 0 refills | Status: AC
  Filled 2024-02-07: qty 60, 60d supply, fill #0

## 2024-02-07 MED ORDER — OXYCODONE HCL 5 MG/5ML PO SOLN: 5.0000 mg | Freq: Once | ORAL | Status: DC | PRN

## 2024-02-07 MED ORDER — ACETAMINOPHEN 10 MG/ML IV SOLN: 1000.0000 mg | Freq: Once | INTRAVENOUS | Status: DC | PRN

## 2024-02-07 MED ORDER — STERILE WATER FOR IRRIGATION IR SOLN
Status: DC | PRN
  Administered 2024-02-07: 1000 mL

## 2024-02-07 SURGICAL SUPPLY — 60 items
ANCHOR FBRTK 2.6 SUTURETAP 1.3 (Anchor) IMPLANT
ANCHOR SUT 1.8 FIBERTAK SB KL (Anchor) IMPLANT
ANCHOR SWIVELOCK BIO 4.75X19.1 (Anchor) IMPLANT
BAG COUNTER SPONGE SURGICOUNT (BAG) ×2 IMPLANT
BENZOIN TINCTURE PRP APPL 2/3 (GAUZE/BANDAGES/DRESSINGS) ×2 IMPLANT
BLADE SHAVER TORPEDO 4X13 (MISCELLANEOUS) IMPLANT
BLADE SURG 11 STRL SS (BLADE) IMPLANT
BLADE SURG 15 STRL LF DISP TIS (BLADE) IMPLANT
CLSR STERI-STRIP ANTIMIC 1/2X4 (GAUZE/BANDAGES/DRESSINGS) IMPLANT
COVER SURGICAL LIGHT HANDLE (MISCELLANEOUS) ×2 IMPLANT
DRAPE INCISE IOBAN 66X45 STRL (DRAPES) ×2 IMPLANT
DRAPE STERI 35X30 U-POUCH (DRAPES) IMPLANT
DRAPE SURG ORHT 6 SPLT 77X108 (DRAPES) ×4 IMPLANT
DRAPE U-SHAPE 47X51 STRL (DRAPES) ×2 IMPLANT
DRSG TEGADERM 4X4.75 (GAUZE/BANDAGES/DRESSINGS) IMPLANT
DURAPREP 26ML APPLICATOR (WOUND CARE) ×2 IMPLANT
DW OUTFLOW CASSETTE/TUBE SET (MISCELLANEOUS) IMPLANT
ELECT CAUTERY BLADE 6.4 (BLADE) ×2 IMPLANT
ELECTRODE REM PT RTRN 9FT ADLT (ELECTROSURGICAL) ×2 IMPLANT
GAUZE PAD ABD 8X10 STRL (GAUZE/BANDAGES/DRESSINGS) ×2 IMPLANT
GAUZE SPONGE 4X4 12PLY STRL (GAUZE/BANDAGES/DRESSINGS) ×2 IMPLANT
GAUZE XEROFORM 1X8 LF (GAUZE/BANDAGES/DRESSINGS) ×2 IMPLANT
GLOVE BIOGEL PI IND STRL 7.0 (GLOVE) IMPLANT
GLOVE ECLIPSE 8.0 STRL XLNG CF (GLOVE) ×2 IMPLANT
GLOVE SURG SS PI 6.5 STRL IVOR (GLOVE) IMPLANT
GOWN STRL REUS W/ TWL LRG LVL3 (GOWN DISPOSABLE) ×4 IMPLANT
KIT BASIN OR (CUSTOM PROCEDURE TRAY) ×2 IMPLANT
KIT STR SPEAR 1.8 FBRTK DISP (KITS) IMPLANT
KIT TURNOVER KIT B (KITS) ×2 IMPLANT
MANIFOLD NEPTUNE II (INSTRUMENTS) ×2 IMPLANT
NDL 1/2 CIR CATGUT .05X1.09 (NEEDLE) IMPLANT
NDL HYPO 25GX1X1/2 BEV (NEEDLE) ×2 IMPLANT
NDL SPNL 18GX3.5 QUINCKE PK (NEEDLE) IMPLANT
NEEDLE 1/2 CIR CATGUT .05X1.09 (NEEDLE) IMPLANT
NEEDLE HYPO 25GX1X1/2 BEV (NEEDLE) ×2 IMPLANT
NEEDLE SPNL 18GX3.5 QUINCKE PK (NEEDLE) IMPLANT
NS IRRIG 1000ML POUR BTL (IV SOLUTION) ×2 IMPLANT
PACK SHOULDER (CUSTOM PROCEDURE TRAY) ×2 IMPLANT
PAD ARMBOARD POSITIONER FOAM (MISCELLANEOUS) ×4 IMPLANT
PASSER SUT SWANSON 36MM LOOP (INSTRUMENTS) IMPLANT
SLING ARM IMMOBILIZER LRG (SOFTGOODS) IMPLANT
SPIKE FLUID TRANSFER (MISCELLANEOUS) IMPLANT
SPONGE T-LAP 18X18 ~~LOC~~+RFID (SPONGE) IMPLANT
SPONGE T-LAP 4X18 ~~LOC~~+RFID (SPONGE) ×4 IMPLANT
SUT ETHILON 3 0 PS 1 (SUTURE) IMPLANT
SUT MNCRL AB 3-0 PS2 27 (SUTURE) IMPLANT
SUT VIC AB 0 CT1 36 (SUTURE) IMPLANT
SUT VIC AB 1 CT1 27XBRD ANBCTR (SUTURE) IMPLANT
SUT VIC AB 2-0 CT2 27 (SUTURE) ×2 IMPLANT
SUT VICRYL 0 UR6 27IN ABS (SUTURE) IMPLANT
SUTURE 0 FIBERLP 38 BLU TPR ND (SUTURE) IMPLANT
SUTURE FIBERWR #2 38 T-5 BLUE (SUTURE) ×6 IMPLANT
SYR CONTROL 10ML LL (SYRINGE) ×2 IMPLANT
SYSTEM FBRTK BUTTON 2.6 (Anchor) IMPLANT
TOWEL GREEN STERILE (TOWEL DISPOSABLE) ×2 IMPLANT
TOWEL GREEN STERILE FF (TOWEL DISPOSABLE) ×2 IMPLANT
TRAY FOLEY MTR SLVR 16FR STAT (SET/KITS/TRAYS/PACK) IMPLANT
TUBING ARTHROSCOPY IRRIG 16FT (MISCELLANEOUS) IMPLANT
WAND ABLATOR APOLLO I90 (BUR) IMPLANT
WATER STERILE IRR 1000ML POUR (IV SOLUTION) ×2 IMPLANT

## 2024-02-07 NOTE — Anesthesia Postprocedure Evaluation (Signed)
 Anesthesia Post Note  Patient: Maria Blackburn  Procedure(s) Performed: ARTHROSCOPY, SHOULDER WITH DEBRIDEMENT, RIGHT (Right) TENODESIS, BICEPS, RIGHT (Right: Shoulder) REPAIR, ROTATOR CUFF, OPEN, RIGHT (Right: Shoulder)     Patient location during evaluation: PACU Anesthesia Type: Regional and General Level of consciousness: awake and alert Pain management: pain level controlled Vital Signs Assessment: post-procedure vital signs reviewed and stable Respiratory status: spontaneous breathing, nonlabored ventilation, respiratory function stable and patient connected to nasal cannula oxygen Cardiovascular status: blood pressure returned to baseline and stable Postop Assessment: no apparent nausea or vomiting Anesthetic complications: no   No notable events documented.  Last Vitals:  Vitals:   02/07/24 1100 02/07/24 1115  BP: 114/71 107/72  Pulse: 74 78  Resp: 18 18  Temp:  36.7 C  SpO2: 97% 91%    Last Pain:  Vitals:   02/07/24 1115  TempSrc:   PainSc: 0-No pain                 Garnette DELENA Gab

## 2024-02-07 NOTE — Transfer of Care (Signed)
 Immediate Anesthesia Transfer of Care Note  Patient: Maria Blackburn  Procedure(s) Performed: ARTHROSCOPY, SHOULDER WITH DEBRIDEMENT, RIGHT (Right) TENODESIS, BICEPS, RIGHT (Right: Shoulder) REPAIR, ROTATOR CUFF, OPEN, RIGHT (Right: Shoulder)  Patient Location: PACU  Anesthesia Type:General  Level of Consciousness: awake and alert   Airway & Oxygen Therapy: Patient Spontanous Breathing and Patient connected to nasal cannula oxygen  Post-op Assessment: Report given to RN and Post -op Vital signs reviewed and stable  Post vital signs: Reviewed and stable  Last Vitals:  Vitals Value Taken Time  BP    Temp    Pulse 86 02/07/24 10:36  Resp 11 02/07/24 10:36  SpO2 97 % 02/07/24 10:36  Vitals shown include unfiled device data.  Last Pain:  Vitals:   02/07/24 9366  TempSrc:   PainSc: 0-No pain         Complications: No notable events documented.

## 2024-02-07 NOTE — H&P (Signed)
 Maria Blackburn is an 53 y.o. female.   Chief Complaint: right shoulder pain HPI: Maria Blackburn is a 53 y.o. female who presents reporting right shoulder pain.  Glenohumeral injection was performed on 09/28/2023.    Motion has improved some from earlier clinic visits.  Doing daily stretching and resistance exercise band work.  Works at school part-time as a Scientist, physiological.  Has a 30-year-old nephew that they are taking care of.  Still hard to sleep sometimes with that right shoulder.  Husband is at home and they have 2 sons.  Patient does have a 1 and half centimeter retracted rotator cuff tear with rounded edges that appears chronic .  She is taking ibuprofen  for symptoms.   Past Medical History:  Diagnosis Date   Anxiety    Bladder spasms    Frequency of urination    GERD (gastroesophageal reflux disease)    Interstitial cystitis    Pelvic pain    SUI (stress urinary incontinence, female)    Urgency of urination     Past Surgical History:  Procedure Laterality Date   ABDOMINAL HYSTERECTOMY  2000   ovaries remain   CYSTO WITH HYDRODISTENSION N/A 03/24/2017   Procedure: CYSTOSCOPY/HYDRODISTENSION INSTILLATION OF MARCAINE  AND PYRIDIUM ;  Surgeon: Watt Rush, MD;  Location: [facility];  Service: Urology;  Laterality: N/A;   HYSTEROSCOPY WITH D & C  1999   POLYPECTOMY   TUBAL LIGATION Bilateral 1996    Family History  Problem Relation Age of Onset   Heart disease Mother    Diabetes Son    Social History:  reports that she has never smoked. She has never used smokeless tobacco. She reports that she does not drink alcohol and does not use drugs.  Allergies: No Known Allergies  Medications Prior to Admission  Medication Sig Dispense Refill   ARIPiprazole  (ABILIFY ) 15 MG tablet Take 1 tablet (15 mg total) by mouth daily. 30 tablet 3   atorvastatin  (LIPITOR) 20 MG tablet Take 1 tablet (20 mg total) by mouth daily. 30 tablet 0   Cyanocobalamin  (VITAMIN B-12) 5000 MCG TBDP  Take 500 mcg by mouth daily.     sertraline  (ZOLOFT ) 100 MG tablet Take 2 tablets (200 mg total) by mouth daily. 60 tablet 3   solifenacin  (VESICARE ) 10 MG tablet Take 1 tablet (10 mg total) by mouth daily. 30 tablet 11   traZODone  (DESYREL ) 50 MG tablet Take 1 tablet (50 mg total) by mouth at bedtime. 30 tablet 3    No results found for this or any previous visit (from the past 48 hours). No results found.  Review of Systems  Musculoskeletal:  Positive for arthralgias.    Blood pressure 121/69, pulse 70, temperature 97.8 F (36.6 C), temperature source Oral, resp. rate 17, height 5' 7 (1.702 m), weight 77.1 kg, SpO2 96%. Physical Exam Vitals reviewed.  HENT:     Head: Normocephalic.     Nose: Nose normal.     Mouth/Throat:     Mouth: Mucous membranes are moist.  Eyes:     Pupils: Pupils are equal, round, and reactive to light.  Cardiovascular:     Rate and Rhythm: Normal rate.     Pulses: Normal pulses.  Pulmonary:     Effort: Pulmonary effort is normal.  Abdominal:     General: Abdomen is flat.  Musculoskeletal:     Cervical back: Normal range of motion.  Skin:    General: Skin is warm.     Capillary  Refill: Capillary refill takes less than 2 seconds.  Neurological:     General: No focal deficit present.     Mental Status: She is alert.  Psychiatric:        Mood and Affect: Mood normal.    Ortho exam demonstrates full active and passive range of motion of the cervical spine. On the right-hand side range of motion is 30/95/160. Does have pretty good external rotation internal rotation strength at 15 degrees of abduction. A little bit of coarseness with popping with internal/external rotation at 90 degrees abduction on the right. No other masses lymphadenopathy or skin changes noted in the shoulder region  Positive obriens and bicipital groove tenderness.  Assessment/Plan Impression is rotator cuff tear with some restricted range of motion at the last clinic visit but  now her range of motion has improved significantly. She has a retracted full-thickness rotator cuff tear which is repairable. I think her best bet for the long-term health and function of her shoulder would be the rotator cuff repair. Surgery for this  would be arthroscopy with debridement biceps tenodesis and mini open rotator cuff tear repair. The risk and benefits are discussed with the patient include not limited to infection or vessel damage shoulder stiffness incomplete pain length as well as incomplete return of function. Patient understands risk and benefits and wishes to proceed. All questions answered. Based on tear mobility we should be able to get her out of the sling at 2 - 3 weeks.  Maria Glendia Hutchinson, MD 02/07/2024, 6:24 AM

## 2024-02-07 NOTE — Op Note (Signed)
 NAME: Maria Blackburn, Maria D. MEDICAL RECORD NO: [id number] ACCOUNT NO: [id number] DATE OF BIRTH: [birth year] FACILITY: [facility] LOCATION: [facility] PHYSICIAN: Cordella RAMAN. Addie, MD  Operative Report   DATE OF PROCEDURE: 02/07/2024  PREOPERATIVE DIAGNOSIS:  Right shoulder rotator cuff tear and biceps tendinitis.  POSTOPERATIVE DIAGNOSIS:  Right shoulder rotator cuff tear of the supraspinatus with significant SLAP tear and biceps tendinitis.  PROCEDURE:  Right shoulder arthroscopy with debridement of the superior labrum, release of the biceps tendon, release of the rotator interval with debridement, and mini open biceps tenodesis and rotator cuff tear repair using a 1 x 2 construct.  SURGEON:  Cordella RAMAN. Addie, MD  ASSISTANT:  Herlene Calix, PA  INDICATIONS:  The patient is a 53 year old with right shoulder pain.  He presents for operative management after explanation of risks and benefits.  MRI scan does show a retracted rotator cuff tear.  DESCRIPTION OF PROCEDURE:  The patient was brought to the operating room where general endotracheal anesthesia was induced.  Preoperative antibiotics administered.  Timeout was called.  The patient was placed in the beach chair position with the head in  neutral position.  Right shoulder, arm and hand were prescrubbed with alcohol and Betadine allowed to air dry. Prepped with DuraPrep solution and draped in a sterile manner.  Ioban was used to seal the operative field and cover the axilla.  Preoperative  range of motion under anesthesia was about 50/90/150.  We were able to get her into full forward flexion with slight manipulation.  At this point, the posterior portal was created 2 cm medial and inferior to the posterolateral margin of the acromion.   Skin and subcutaneous tissues were sharply divided.  The arthroscopy demonstrated significant unstable SLAP tear from the 10 o'clock to 2 o'clock position.  There was also synovitis within the rotator interval.  Rotator cuff  tear of the supraspinatus was  evaluated and visualized.  Glenohumeral articular surface was intact.  Anterior inferior, and posterior inferior glenohumeral ligament intact.  Next, the anterior portal was created under direct visualization.  ArthroCare wand was used to debride the  superior labral tear and release the biceps tendon.  The rotator interval was also debrided and released.  This assisted to get external rotation out to about 65 degrees.  At this time, the instruments were removed and the portals were closed using 3-0  nylon.  Ioban was used to cover the entire operative field.  Incision was made off the anterolateral margin of the acromion.  Skin and subcutaneous tissues were sharply divided.  The deltoid was split between the anterior middle deltoid and the raphe and  marked with a #1 Vicryl suture 4 cm from the anterior lateral margin of the acromion.  Bursectomy was completed.  Acromioplasty was performed.  The biceps tendon was then tenodesed into the bicipital groove after opening up the transverse humeral  ligament.  We placed a FiberLoop suture around the biceps tendon and cut off about 2 cm.  Then under appropriate tension, knotless suture anchor was placed in the distal as well as proximal aspect of the bicipital groove.  The biceps tendon was then  tensioned down into the groove and oversewn with 0 Vicryl suture.  Secure fixation which was flush with the groove was achieved.  Next, attention was directed towards the rotator cuff tear.  The patient did have a rotator cuff tear, but it did not really  appear as the MRI scan intimated, but there was a full-thickness tear, which was  a split tear in the tendon of the supraspinatus.  The split tear was extended to the greater tuberosity lateral aspect.  Detachment with delamination was visualized.  At  this time, the devitalized appearing rotator cuff tendon was removed sharply.  3-0 FiberWire sutures were placed for margin convergence using  a Scorpion suture passer.  We then placed one knotless suture anchor at the junction of the humeral head  articular surface and the greater tuberosity.  This anchor did achieve good fixation.  We placed the two limbs of the SutureTape through the two flaps for continued margin convergence.  We then oversewed that portion of the rotator cuff tear lateral to  the anchor using 2-0 Vicryl sutures.  The margin converging 0 FiberWire sutures were then tied along with the suture tapes and then we split the limbs and placed all of these into two SwiveLocks.  This gave a very secure watertight repair.  Arm was taken  through a range of motion and found to have very good range of motion with external rotation, abduction, and forward flexion.  Thorough irrigation was performed.  The self-retaining retractor was removed and we closed the deltoid split using #1 Vicryl  suture followed by interrupted inverted 0 Vicryl suture, 2-0 Vicryl suture, and 3-0 Monocryl with Steri-Strips and impervious dressings applied.  The patient tolerated the procedure well without immediate complications and transferred to the recovery  room in stable condition.  Luke's assistance was required at all times for retraction, opening, closing, mobilization of tissue.  His assistance was of medical necessity.   PUS D: 02/07/2024 10:19:29 am T: 02/07/2024 11:33:00 am  JOB: [id number]/ [id number]

## 2024-02-07 NOTE — Brief Op Note (Signed)
   02/07/2024  10:12 AM  PATIENT:  Joen JONETTA Cedar  53 y.o. female  PRE-OPERATIVE DIAGNOSIS:  right shoulder rotator cuff tear, biceps tendinitis  POST-OPERATIVE DIAGNOSIS:  right shoulder rotator cuff tear, biceps tendinitis  PROCEDURE:  Procedure(s): ARTHROSCOPY, SHOULDER WITH DEBRIDEMENT, RIGHT TENODESIS, BICEPS, RIGHT REPAIR, ROTATOR CUFF, OPEN, RIGHT  SURGEON:  Surgeon(s): Addie, Cordella Hamilton, MD  ASSISTANT: magnant pa  ANESTHESIA:   general  EBL: 10 ml    Total I/O In: 700 [I.V.:500; IV Piggyback:200] Out: 10 [Blood:10]  BLOOD ADMINISTERED: none  DRAINS: none   LOCAL MEDICATIONS USED:  none  SPECIMEN:  No Specimen  COUNTS:  YES  TOURNIQUET:  * No tourniquets in log *  DICTATION: .Other Dictation: Dictation Number done  PLAN OF CARE: Discharge to home after PACU  PATIENT DISPOSITION:  PACU - hemodynamically stable

## 2024-02-07 NOTE — Anesthesia Procedure Notes (Signed)
 Anesthesia Regional Block: Interscalene brachial plexus block   Pre-Anesthetic Checklist: , timeout performed,  Correct Patient, Correct Site, Correct Laterality,  Correct Procedure, Correct Position, site marked,  Risks and benefits discussed,  Surgical consent,  Pre-op evaluation,  At surgeon's request and post-op pain management  Laterality: Upper and Right  Prep: Maximum Sterile Barrier Precautions used, chloraprep       Needles:  Injection technique: Single-shot  Needle Type: Echogenic Needle     Needle Length: 5cm  Needle Gauge: 21     Additional Needles:   Procedures:,,,, ultrasound used (permanent image in chart),,    Narrative:  Start time: 02/07/2024 7:18 AM End time: 02/07/2024 7:25 AM Injection made incrementally with aspirations every 5 mL.  Performed by: Personally  Anesthesiologist: Jefm Garnette LABOR, MD  Additional Notes: Block assessed prior to procedure. Patient tolerated procedure well.

## 2024-02-09 ENCOUNTER — Encounter (HOSPITAL_COMMUNITY): Payer: Self-pay | Admitting: Orthopedic Surgery

## 2024-02-15 ENCOUNTER — Ambulatory Visit (INDEPENDENT_AMBULATORY_CARE_PROVIDER_SITE_OTHER): Payer: MEDICAID | Admitting: Orthopedic Surgery

## 2024-02-15 DIAGNOSIS — M75121 Complete rotator cuff tear or rupture of right shoulder, not specified as traumatic: Secondary | ICD-10-CM

## 2024-02-16 ENCOUNTER — Encounter: Payer: Self-pay | Admitting: Orthopedic Surgery

## 2024-02-16 NOTE — Progress Notes (Signed)
 Post-Op Visit Note   Patient: Maria Blackburn           Date of Birth: 12-28-1970           MRN: [id number] Visit Date: 02/15/2024 PCP: Lorren Greig PARAS, NP   Assessment & Plan:  Chief Complaint:  Chief Complaint  Patient presents with   Right Shoulder - Routine Post Op   Other    RIGHT SHOULDER SCOPE (surgery date 02-07-24) Deb of superior labrum, release of BT, release of RI with deb, MOBT and RCTR   Visit Diagnoses:  1. Nontraumatic complete tear of right rotator cuff     Plan: Joen underwent right shoulder arthroscopy with biceps tenodesis rotator cuff tear repair and release of rotator interval.  Taking oxycodone  as well as Tylenol .  On exam she has improving range of motion.  Incisions intact.  Deltoid fires nicely.  Plan at this time is physical therapy for passive range of motion and active assisted range of motion.  Patient had a 2 x 1 construct.  Therapy needs to be 2 times a week for 4 weeks for stretching and no strengthening.  4-week return for clinical recheck.  Discontinue sling next Tuesday which will be 2 weeks postop.  No lifting with that right arm.  Okay for typing.  Follow-Up Instructions: No follow-ups on file.   Orders:  No orders of the defined types were placed in this encounter.  No orders of the defined types were placed in this encounter.   Imaging: No results found.  PMFS History: Patient Active Problem List   Diagnosis Date Noted   PTSD (post-traumatic stress disorder) 10/22/2023   MDD (major depressive disorder), recurrent, severe, with psychosis (HCC) 10/21/2023   Acute psychosis (HCC) 09/29/2023   GAD (generalized anxiety disorder) 09/29/2023   MDD (major depressive disorder) 09/29/2023   Adhesive capsulitis of right shoulder 09/16/2023   Nontraumatic complete tear of right rotator cuff 09/16/2023   De Quervain's tenosynovitis, left 09/10/2021   Lateral epicondylitis, right elbow 09/10/2021   Allergy to environmental factors  10/07/2017   Past Medical History:  Diagnosis Date   Anxiety    Bladder spasms    Frequency of urination    GERD (gastroesophageal reflux disease)    Interstitial cystitis    Pelvic pain    SUI (stress urinary incontinence, female)    Urgency of urination     Family History  Problem Relation Age of Onset   Heart disease Mother    Diabetes Son     Past Surgical History:  Procedure Laterality Date   ABDOMINAL HYSTERECTOMY  2000   ovaries remain   BICEPT TENODESIS Right 02/07/2024   Procedure: TENODESIS, BICEPS, RIGHT;  Surgeon: Addie Cordella Hamilton, MD;  Location: [facility] OR;  Service: Orthopedics;  Laterality: Right;   CYSTO WITH HYDRODISTENSION N/A 03/24/2017   Procedure: CYSTOSCOPY/HYDRODISTENSION INSTILLATION OF MARCAINE  AND PYRIDIUM ;  Surgeon: Watt Rush, MD;  Location: [facility];  Service: Urology;  Laterality: N/A;   HYSTEROSCOPY WITH D & C  1999   POLYPECTOMY   POSTERIOR LUMBAR FUSION 2 WITH HARDWARE REMOVAL Right 02/07/2024   Procedure: ARTHROSCOPY, SHOULDER WITH DEBRIDEMENT, RIGHT;  Surgeon: Addie Cordella Hamilton, MD;  Location: [facility] OR;  Service: Orthopedics;  Laterality: Right;  right shoulder arthroscopy, debridement, biceps tenodesis, mini open rotator cuff tear repair   SHOULDER OPEN ROTATOR CUFF REPAIR Right 02/07/2024   Procedure: REPAIR, ROTATOR CUFF, OPEN, RIGHT;  Surgeon: Addie Cordella Hamilton, MD;  Location: [facility] OR;  Service: Orthopedics;  Laterality: Right;   TUBAL LIGATION Bilateral 1996   Social History   Occupational History   Not on file  Tobacco Use   Smoking status: Never   Smokeless tobacco: Never  Vaping Use   Vaping status: Never Used  Substance and Sexual Activity   Alcohol use: No   Drug use: No   Sexual activity: Yes

## 2024-02-19 ENCOUNTER — Encounter (HOSPITAL_COMMUNITY): Payer: Self-pay | Admitting: Orthopedic Surgery

## 2024-02-19 NOTE — Addendum Note (Signed)
 Addendum  created 02/19/24 1316 by Jefm Garnette LABOR, MD   Intraprocedure Event edited, Intraprocedure Staff edited

## 2024-02-21 ENCOUNTER — Ambulatory Visit (INDEPENDENT_AMBULATORY_CARE_PROVIDER_SITE_OTHER): Payer: MEDICAID | Admitting: Psychiatry

## 2024-02-21 ENCOUNTER — Encounter (HOSPITAL_COMMUNITY): Payer: Self-pay | Admitting: Psychiatry

## 2024-02-21 VITALS — BP 107/72 | HR 78 | Ht 67.0 in | Wt 170.0 lb

## 2024-02-21 DIAGNOSIS — F4310 Post-traumatic stress disorder, unspecified: Secondary | ICD-10-CM

## 2024-02-21 DIAGNOSIS — F333 Major depressive disorder, recurrent, severe with psychotic symptoms: Secondary | ICD-10-CM

## 2024-02-21 DIAGNOSIS — F411 Generalized anxiety disorder: Secondary | ICD-10-CM

## 2024-02-21 DIAGNOSIS — F331 Major depressive disorder, recurrent, moderate: Secondary | ICD-10-CM | POA: Insufficient documentation

## 2024-02-21 MED ORDER — FLUOXETINE HCL 10 MG PO CAPS: 10.0000 mg | ORAL_CAPSULE | Freq: Every day | ORAL | 0 refills | Status: DC

## 2024-02-21 MED ORDER — TRAZODONE HCL 50 MG PO TABS: 50.0000 mg | ORAL_TABLET | Freq: Every day | ORAL | 3 refills | Status: DC

## 2024-02-21 MED ORDER — SERTRALINE HCL 100 MG PO TABS: 200.0000 mg | ORAL_TABLET | Freq: Every day | ORAL | 3 refills | Status: DC

## 2024-02-21 MED ORDER — ARIPIPRAZOLE 15 MG PO TABS: 15.0000 mg | ORAL_TABLET | Freq: Every day | ORAL | 3 refills | Status: DC

## 2024-02-21 NOTE — Progress Notes (Deleted)
 BH MD Outpatient Progress Note  Date of visit: 12/22/2023 Maria Blackburn  MRN:  [id number]  Assessment:  Maria Blackburn presents for follow-up evaluation.  She was previously seen by clinic nurse practitioner as a hospital follow-up.  History briefly described below.  Since our last appointment the patient reports that she has been doing well and full medication adherence.  No medication changes planned for today.  Patient given contact information for cardiology office to follow-up on referral there.  See below for further details on prolonged QT.  The patient will follow-up with a new psychiatric provider due to routine resident transition.  She will see Sharlot Becker on 8/19.  She may be able to return to the care of Zane Bach, who did her initial evaluation at this clinic.  Brief summary of recent psychiatric history: The patient was hospitalized at [facility] a few months ago due to disturbing auditory hallucinations, which appeared to have been unusual in nature, unrecognizable voices saying they are going to kill you.  She was treated with risperidone  and Zoloft .  Psychiatric hospitalization again a few weeks later due to poor response to treatment and subsequent auditory hallucinations.  Diagnosis at that time MDD with psychotic features.  Switch to Abilify .  A few weeks later saw clinic nurse practitioner.  Diagnosis at that time PTSD and MDD with psychotic features.  Abilify  increased from 10 mg daily to 15 mg daily due to racing thoughts.  Overall it appeared the patient was doing fairly well.  UDS x 3 negative.    Identifying Information: Maria Blackburn is a 53 year old female with a history of MDD with psychotic features who is an established patient with [facility] for management of auditory hallucinations and recent psychiatric hospitalizations.   Plan:  # MDD with psychotic features Interventions: -- Continue Abilify  15 mg  daily - Continue trazodone  50 mg nightly - Continue Zoloft  200 mg daily  #Long term use of antipsychotic medication -- Lipid panel from 10/2023 - A1c from 10/2023 - See discussion of QT interval below and EKGs - No tardive movements noted on assessment today - No weight gain on Abilify  so far  # Prolonged QT interval - Noted on EKG from 4/21 at the emergency department.  QTc B noted to be in the high 600s.  Correction with alternative formula also yields a QTc in the 600s. - Previous EKGs reviewed.  EKG from 4/20 unremarkable.  EKG from 4/18 with QTc of 499.  EKG from 3/27, unremarkable. - Overall there is some concern for prolonged QT, though I do not think it is likely a QTc of 600 will be demonstrated on subsequent EKGs. - Primary care provider referred to cardiology.  It appears to still be pending.  I gave the patient the phone number to reach out to this clinic.   Patient was given contact information for behavioral health clinic and was instructed to call 911 for emergencies.   Subjective:  Chief Complaint:  Chief Complaint  Patient presents with   Depression   Follow-up    Interval History:  Social history briefly reviewed: Patient living in [place] with supportive husband who is retired.  2 adult sons in the home, 1 of whom has autism and require substantial support.  She reports that she is the primary caregiver for her 22-year-old nephew who has autism.  She says he is about to receive ABA therapy on an intensive basis.  She says that she is  pursuing another bachelor's degree in history.  She spends much of her time studying and taking care of 77-year-old child.  The patient feels that poor sleep habits helped cause her issues with auditory hallucinations.  She says that her sleep has been doing better recently because her husband is helping more with childcare.  She feels that her sleep patterns will continue to improve going forward and she is hopeful about this.  The  patient says she has not experienced any auditory hallucinations since her last hospitalization.  She reports her racing thoughts have improved with increased dose of Abilify .  She denies experiencing any thoughts of self-harm.  She feels her mood is okay.  Reviewed manic symptomatology.  It does not appear the patient has experienced a manic episode.  Patient denies recent menopausal symptoms.  Visit Diagnosis:    ICD-10-CM   1. Major depressive disorder, recurrent episode, moderate (HCC)  F33.1     2. GAD (generalized anxiety disorder)  F41.1 sertraline  (ZOLOFT ) 100 MG tablet    traZODone  (DESYREL ) 50 MG tablet    3. MDD (major depressive disorder), recurrent, severe, with psychosis (HCC)  F33.3 ARIPiprazole  (ABILIFY ) 15 MG tablet    sertraline  (ZOLOFT ) 100 MG tablet    traZODone  (DESYREL ) 50 MG tablet    4. PTSD (post-traumatic stress disorder)  F43.10 sertraline  (ZOLOFT ) 100 MG tablet    traZODone  (DESYREL ) 50 MG tablet      Past Psychiatric History: None prior to psychiatric hospitalizations  Past Medical History:  Past Medical History:  Diagnosis Date   Anxiety    Bladder spasms    Frequency of urination    GERD (gastroesophageal reflux disease)    Interstitial cystitis    Pelvic pain    SUI (stress urinary incontinence, female)    Urgency of urination     Past Surgical History:  Procedure Laterality Date   ABDOMINAL HYSTERECTOMY  2000   ovaries remain   BICEPT TENODESIS Right 02/07/2024   Procedure: TENODESIS, BICEPS, RIGHT;  Surgeon: Addie Cordella Hamilton, MD;  Location: [facility] OR;  Service: Orthopedics;  Laterality: Right;   CYSTO WITH HYDRODISTENSION N/A 03/24/2017   Procedure: CYSTOSCOPY/HYDRODISTENSION INSTILLATION OF MARCAINE  AND PYRIDIUM ;  Surgeon: Watt Rush, MD;  Location: [facility];  Service: Urology;  Laterality: N/A;   HYSTEROSCOPY WITH D & C  1999   POLYPECTOMY   POSTERIOR LUMBAR FUSION 2 WITH HARDWARE REMOVAL Right 02/07/2024   Procedure:  ARTHROSCOPY, SHOULDER WITH DEBRIDEMENT, RIGHT;  Surgeon: Addie Cordella Hamilton, MD;  Location: [facility] OR;  Service: Orthopedics;  Laterality: Right;  right shoulder arthroscopy, debridement, biceps tenodesis, mini open rotator cuff tear repair   SHOULDER OPEN ROTATOR CUFF REPAIR Right 02/07/2024   Procedure: REPAIR, ROTATOR CUFF, OPEN, RIGHT;  Surgeon: Addie Cordella Hamilton, MD;  Location: [facility] OR;  Service: Orthopedics;  Laterality: Right;   TUBAL LIGATION Bilateral 1996    Family Psychiatric History: None pertinent  Family History:  Family History  Problem Relation Age of Onset   Heart disease Mother    Diabetes Son     Social History:  Social History   Socioeconomic History   Marital status: Married    Spouse name: Not on file   Number of children: Not on file   Years of education: Not on file   Highest education level: Not on file  Occupational History   Not on file  Tobacco Use   Smoking status: Never   Smokeless tobacco: Never  Vaping Use   Vaping status:  Never Used  Substance and Sexual Activity   Alcohol use: No   Drug use: No   Sexual activity: Yes  Other Topics Concern   Not on file  Social History Narrative   Not on file   Social Drivers of Health   Financial Resource Strain: Low Risk  (03/25/2023)   Overall Financial Resource Strain (CARDIA)    Difficulty of Paying Living Expenses: Not very hard  Food Insecurity: No Food Insecurity (10/21/2023)   Hunger Vital Sign    Worried About Running Out of Food in the Last Year: Never true    Ran Out of Food in the Last Year: Never true  Transportation Needs: No Transportation Needs (10/21/2023)   PRAPARE - Administrator, Civil Service (Medical): No    Lack of Transportation (Non-Medical): No  Physical Activity: Patient Unable To Answer (03/25/2023)   Exercise Vital Sign    Days of Exercise per Week: Patient unable to answer    Minutes of Exercise per Session: Patient unable to answer  Stress: Patient Unable To  Answer (03/25/2023)   [employer] of Occupational Health - Occupational Stress Questionnaire    Feeling of Stress : Patient unable to answer  Social Connections: Socially Integrated (10/21/2023)   Social Connection and Isolation Panel    Frequency of Communication with Friends and Family: More than three times a week    Frequency of Social Gatherings with Friends and Family: More than three times a week    Attends Religious Services: More than 4 times per year    Active Member of [employer] or Organizations: Yes    Attends Engineer, structural: More than 4 times per year    Marital Status: Married    Allergies: No Known Allergies  Current Medications: Current Outpatient Medications  Medication Sig Dispense Refill   FLUoxetine  (PROZAC ) 10 MG capsule Take 1 capsule (10 mg total) by mouth daily. 30 capsule 0   acetaminophen  (TYLENOL ) 500 MG tablet Take 1,000 mg by mouth every 6 (six) hours as needed for mild pain (pain score 1-3).     ARIPiprazole  (ABILIFY ) 15 MG tablet Take 1 tablet (15 mg total) by mouth daily. 30 tablet 3   atorvastatin  (LIPITOR) 20 MG tablet Take 1 tablet (20 mg total) by mouth daily. 30 tablet 0   celecoxib  (CELEBREX ) 100 MG capsule Take 1 capsule (100 mg total) by mouth daily. 60 capsule 0   Cyanocobalamin  (VITAMIN B-12) 5000 MCG TBDP Take 500 mcg by mouth daily.     methocarbamol  (ROBAXIN ) 500 MG tablet Take 1 tablet (500 mg total) by mouth every 8 (eight) hours as needed for muscle spasms. 30 tablet 1   oxyCODONE  (ROXICODONE ) 5 MG immediate release tablet Take 1 tablet (5 mg total) by mouth every 4 (four) hours as needed for severe pain (pain score 7-10). 30 tablet 0   sertraline  (ZOLOFT ) 100 MG tablet Take 2 tablets (200 mg total) by mouth daily. 60 tablet 3   solifenacin  (VESICARE ) 10 MG tablet Take 1 tablet (10 mg total) by mouth daily. 30 tablet 11   traZODone  (DESYREL ) 50 MG tablet Take 1 tablet (50 mg total) by mouth at bedtime. 30 tablet 3   No  current facility-administered medications for this visit.     Objective:  Psychiatric Specialty Exam: Physical Exam Constitutional:      Appearance: the patient is not toxic-appearing.  Pulmonary:     Effort: Pulmonary effort is normal.  Neurological:     General:  No focal deficit present.     Mental Status: the patient is alert and oriented to person, place, and time.   Review of Systems  Respiratory:  Negative for shortness of breath.   Cardiovascular:  Negative for chest pain.  Gastrointestinal:  Negative for abdominal pain, constipation, diarrhea, nausea and vomiting.  Neurological:  Negative for headaches.      There were no vitals taken for this visit.  General Appearance: Fairly Groomed  Eye Contact:  Good  Speech:  Clear and Coherent  Volume:  Normal  Mood:  Euthymic  Affect:  Congruent  Thought Process:  Coherent  Orientation:  Full (Time, Place, and Person)  Thought Content: Logical   Suicidal Thoughts:  No  Homicidal Thoughts:  No  Memory:  Immediate;   Good  Judgement:  fair  Insight:  fair  Psychomotor Activity:  Normal  Concentration:  Concentration: Good  Recall:  Good  Fund of Knowledge: Good  Language: Good  Akathisia:  No  Handed:    AIMS (if indicated): not done  Assets:  Communication Skills Desire for Improvement Financial Resources/Insurance Housing Leisure Time Physical Health  ADL's:  Intact  Cognition: WNL       Metabolic Disorder Labs: Lab Results  Component Value Date   HGBA1C 5.9 (H) 07/07/2023   No results found for: PROLACTIN Lab Results  Component Value Date   CHOL 247 (H) 08/12/2023   TRIG 135 08/12/2023   HDL 54 08/12/2023   CHOLHDL 4.6 (H) 08/12/2023   VLDL 18 03/20/2016   LDLCALC 169 (H) 08/12/2023   LDLCALC 147 (H) 07/07/2023   Lab Results  Component Value Date   TSH 0.964 10/05/2023   TSH 0.262 (L) 09/29/2023    Therapeutic Level Labs: No results found for: LITHIUM No results found for:  VALPROATE No results found for: CBMZ  Screenings: AUDIT    Flowsheet Row [facility]  Alcohol Use Disorder Identification Test Final Score (AUDIT) 0 0   GAD-7    Flowsheet Row Office Visit from 11/04/2023 in [facility] Office Visit from 10/31/2023 in [facility] Office Visit from 02/02/2023 in [facility]  Total GAD-7 Score 0 3 1   PHQ2-9    Flowsheet Row Office Visit from 11/04/2023 in [facility] Office Visit from 10/31/2023 in [facility] ED from 09/29/2023 in [facility] Emergency Department at [facility] Office Visit from 04/04/2023 in [facility] Office Visit from 02/02/2023 in [facility]  PHQ-2 Total Score 0 1 2 4 4   PHQ-9 Total Score 3 4 9 12 7    Flowsheet Row Admission (Discharged) from 02/07/2024 in [facility] Office Visit from 10/31/2023 in [facility]Dis[facility]  C-SSRS RISK CATEGORY No Risk No Risk No Risk    Collaboration of Care: none  A total of 30 minutes was spent involved in face to face clinical care, chart review, documentation.   Sharlot Becker, NP 02/21/2024, 2:23 PM

## 2024-02-21 NOTE — Progress Notes (Signed)
 BH NP OP Progress Note  02/21/2024 3:51 PM Maria Blackburn  MRN:  [id number]  Chief Complaint:  Chief Complaint  Patient presents with   Depression   Follow-up   HPI: Maria Blackburn presents for follow-up evaluation.  She has a history of PTSD, GAD, and MDD with most recent episode with psychosis, admitted in April, 2025 at [facility].  Brief summary of recent psychiatric history from Dr Marry on 12/22/2023: The patient was hospitalized at [facility] a few months ago due to disturbing auditory hallucinations, which appeared to have been unusual in nature, unrecognizable voices saying they are going to kill you.  She was treated with risperidone  and Zoloft .  Psychiatric hospitalization again a few weeks later due to poor response to treatment and subsequent auditory hallucinations.  Diagnosis at that time MDD with psychotic features.  Switch to Abilify .  A few weeks later saw clinic nurse practitioner.  Diagnosis at that time PTSD and MDD with psychotic features.  Abilify  increased from 10 mg daily to 15 mg daily due to racing thoughts.  Overall it appeared the patient was doing fairly well.  UDS x 3 negative.  Today, she reported her depression was moderate with lower motivation than her baseline.  She recently had left should surgery two weeks ago with pain level low to moderate at times.  Denies suicidal ideations and hallucinations, Thank goodness, I haven't had those again since her hospitalization in April.  Anxiety is also moderate because she is worried about her shoulder as her husband cares for her 50-year-old adopted nephew that she has had since birth with full custody.  It has been challenging with him wanting her to care for him and caution needed with her shoulder.  No panic attacks.  Sleep is fair related to being uncomfortable from the surgery with having to avoid certain positions.  Appetite has picked up.  She does complain of difficulty focusing and keeping up  with her studying for a history degree which she contributes to her stress and depression.  Discussed options and added a low dose of Prozac  since her son takes it with good results.  She is already on Zoloft  200 mg daily and feels her mood needs a boost with something beside the Abilify .    Identifying Information: Maria Blackburn is a 53 year old female with a history of MDD with psychotic features who is an established patient with [facility] for management of auditory hallucinations and recent psychiatric hospitalization  Visit Diagnosis:    ICD-10-CM   1. Major depressive disorder, recurrent episode, moderate (HCC)  F33.1     2. GAD (generalized anxiety disorder)  F41.1 sertraline  (ZOLOFT ) 100 MG tablet    traZODone  (DESYREL ) 50 MG tablet    3. MDD (major depressive disorder), recurrent, severe, with psychosis (HCC)  F33.3 ARIPiprazole  (ABILIFY ) 15 MG tablet    sertraline  (ZOLOFT ) 100 MG tablet    traZODone  (DESYREL ) 50 MG tablet    4. PTSD (post-traumatic stress disorder)  F43.10 sertraline  (ZOLOFT ) 100 MG tablet    traZODone  (DESYREL ) 50 MG tablet      Past Psychiatric History: GAD, PTSD, MDD  Past Medical History:  Past Medical History:  Diagnosis Date   Anxiety    Bladder spasms    Frequency of urination    GERD (gastroesophageal reflux disease)    Interstitial cystitis    Pelvic pain    SUI (stress urinary incontinence, female)    Urgency of urination  Past Surgical History:  Procedure Laterality Date   ABDOMINAL HYSTERECTOMY  2000   ovaries remain   BICEPT TENODESIS Right 02/07/2024   Procedure: TENODESIS, BICEPS, RIGHT;  Surgeon: Addie Cordella Hamilton, MD;  Location: [facility] OR;  Service: Orthopedics;  Laterality: Right;   CYSTO WITH HYDRODISTENSION N/A 03/24/2017   Procedure: CYSTOSCOPY/HYDRODISTENSION INSTILLATION OF MARCAINE  AND PYRIDIUM ;  Surgeon: Watt Rush, MD;  [facility];  Service: Urology;  Laterality: N/A;    HYSTEROSCOPY WITH D & C  1999   POLYPECTOMY   POSTERIOR LUMBAR FUSION 2 WITH HARDWARE REMOVAL Right 02/07/2024   Procedure: ARTHROSCOPY, SHOULDER WITH DEBRIDEMENT, RIGHT;  Surgeon: Addie Cordella Hamilton, MD;  Location: [facility] OR;  Service: Orthopedics;  Laterality: Right;  right shoulder arthroscopy, debridement, biceps tenodesis, mini open rotator cuff tear repair   SHOULDER OPEN ROTATOR CUFF REPAIR Right 02/07/2024   Procedure: REPAIR, ROTATOR CUFF, OPEN, RIGHT;  Surgeon: Addie Cordella Hamilton, MD;  Location: [facility] OR;  Service: Orthopedics;  Laterality: Right;   TUBAL LIGATION Bilateral 1996    Family Psychiatric History: MDD, GAD, PTSD  Family History:  Family History  Problem Relation Age of Onset   Heart disease Mother    Diabetes Son     Social History:  Social History   Socioeconomic History   Marital status: Married    Spouse name: Not on file   Number of children: Not on file   Years of education: Not on file   Highest education level: Not on file  Occupational History   Not on file  Tobacco Use   Smoking status: Never   Smokeless tobacco: Never  Vaping Use   Vaping status: Never Used  Substance and Sexual Activity   Alcohol use: No   Drug use: No   Sexual activity: Yes  Other Topics Concern   Not on file  Social History Narrative   Not on file   Social Drivers of Health   Financial Resource Strain: Low Risk  (03/25/2023)   Overall Financial Resource Strain (CARDIA)    Difficulty of Paying Living Expenses: Not very hard  Food Insecurity: No Food Insecurity (10/21/2023)   Hunger Vital Sign    Worried About Running Out of Food in the Last Year: Never true    Ran Out of Food in the Last Year: Never true  Transportation Needs: No Transportation Needs (10/21/2023)   PRAPARE - Administrator, Civil Service (Medical): No    Lack of Transportation (Non-Medical): No  Physical Activity: Patient Unable To Answer (03/25/2023)   Exercise Vital Sign    Days of Exercise  per Week: Patient unable to answer    Minutes of Exercise per Session: Patient unable to answer  Stress: Patient Unable To Answer (03/25/2023)   [employer] of Occupational Health - Occupational Stress Questionnaire    Feeling of Stress : Patient unable to answer  Social Connections: Socially Integrated (10/21/2023)   Social Connection and Isolation Panel    Frequency of Communication with Friends and Family: More than three times a week    Frequency of Social Gatherings with Friends and Family: More than three times a week    Attends Religious Services: More than 4 times per year    Active Member of [employer] or Organizations: Yes    Attends Engineer, structural: More than 4 times per year    Marital Status: Married    Allergies: No Known Allergies  Metabolic Disorder Labs: Lab Results  Component Value Date  HGBA1C 5.9 (H) 07/07/2023   No results found for: PROLACTIN Lab Results  Component Value Date   CHOL 247 (H) 08/12/2023   TRIG 135 08/12/2023   HDL 54 08/12/2023   CHOLHDL 4.6 (H) 08/12/2023   VLDL 18 03/20/2016   LDLCALC 169 (H) 08/12/2023   LDLCALC 147 (H) 07/07/2023   Lab Results  Component Value Date   TSH 0.964 10/05/2023   TSH 0.262 (L) 09/29/2023    Therapeutic Level Labs: No results found for: LITHIUM No results found for: VALPROATE No results found for: CBMZ  Current Medications: Current Outpatient Medications  Medication Sig Dispense Refill   FLUoxetine  (PROZAC ) 10 MG capsule Take 1 capsule (10 mg total) by mouth daily. 30 capsule 0   acetaminophen  (TYLENOL ) 500 MG tablet Take 1,000 mg by mouth every 6 (six) hours as needed for mild pain (pain score 1-3).     ARIPiprazole  (ABILIFY ) 15 MG tablet Take 1 tablet (15 mg total) by mouth daily. 30 tablet 3   atorvastatin  (LIPITOR) 20 MG tablet Take 1 tablet (20 mg total) by mouth daily. 30 tablet 0   celecoxib  (CELEBREX ) 100 MG capsule Take 1 capsule (100 mg total) by mouth daily. 60  capsule 0   Cyanocobalamin  (VITAMIN B-12) 5000 MCG TBDP Take 500 mcg by mouth daily.     methocarbamol  (ROBAXIN ) 500 MG tablet Take 1 tablet (500 mg total) by mouth every 8 (eight) hours as needed for muscle spasms. 30 tablet 1   oxyCODONE  (ROXICODONE ) 5 MG immediate release tablet Take 1 tablet (5 mg total) by mouth every 4 (four) hours as needed for severe pain (pain score 7-10). 30 tablet 0   sertraline  (ZOLOFT ) 100 MG tablet Take 2 tablets (200 mg total) by mouth daily. 60 tablet 3   solifenacin  (VESICARE ) 10 MG tablet Take 1 tablet (10 mg total) by mouth daily. 30 tablet 11   traZODone  (DESYREL ) 50 MG tablet Take 1 tablet (50 mg total) by mouth at bedtime. 30 tablet 3   No current facility-administered medications for this visit.     Musculoskeletal: Strength & Muscle Tone: within normal limits Gait & Station: normal Patient leans: N/A  Psychiatric Specialty Exam: Review of Systems  Musculoskeletal:        Left should pain, recent surgery  Psychiatric/Behavioral:  Positive for dysphoric mood. The patient is nervous/anxious.   All other systems reviewed and are negative.   There were no vitals taken for this visit.There is no height or weight on file to calculate BMI.  General Appearance: Casual  Eye Contact:  Good  Speech:  Clear and Coherent  Volume:  Normal  Mood:  Anxious and Depressed  Affect:  Appropriate  Thought Process:  Coherent  Orientation:  Full (Time, Place, and Person)  Thought Content: Logical   Suicidal Thoughts:  No  Homicidal Thoughts:  No  Memory:  Immediate;   Good Recent;   Good Remote;   Good  Judgement:  Good  Insight:  Good  Psychomotor Activity:  Normal  Concentration:  Concentration: Good and Attention Span: Good  Recall:  Good  Fund of Knowledge: Good  Language: Good  Akathisia:  No  Handed:  Right  AIMS (if indicated): not done  Assets:  Communication Skills Desire for Improvement Housing Intimacy Leisure Time Resilience Social  Support Vocational/Educational  ADL's:  Intact  Cognition: WNL  Sleep:  Fair   Screenings: AUDIT    Flowsheet Row [facility]  [facility]  Alcohol Use Disorder Identification Test Final Score (AUDIT) 0 0   GAD-7    Flowsheet Row Office Visit from 11/04/2023 in [facility] Office Visit from 10/31/2023 in [facility] Office Visit from 02/02/2023 in [facility]  Total GAD-7 Score 0 3 1   PHQ2-9    Flowsheet Row Office Visit from 11/04/2023 in [facility] Office Visit from 10/31/2023 in [facility] ED from 09/29/2023 in [facility] at [facility] Office Visit from 04/04/2023 in [facility] Office Visit from 02/02/2023 in [facility] at [facility]  PHQ-2 Total Score 0 1 2 4 4   PHQ-9 Total Score 3 4 9 12 7    Flowsheet Row Admission (Discharged) from 02/07/2024 in [place] [facility] Office Visit from 10/31/2023 in [facility]Dis[facility]  C-SSRS RISK CATEGORY No Risk No Risk No Risk     Assessment and Plan:  # MDD with psychotic features Interventions: -- Continue Abilify  15 mg daily - Continue Zoloft  200 mg daily -- Started Prozac  10 mg daily  Insomnia: - Continue Trazodone  50 mg nightly   #Long term use of antipsychotic medication -- Lipid panel from 10/2023 - A1c from 10/2023 - See discussion of QT interval below and EKGs - No tardive movements noted on assessment today - No weight gain on Abilify  so far   # Prolonged QT interval - Noted on EKG from 4/21 at the emergency department.  QTc B noted to be in  the high 600s.  Correction with alternative formula also yields a QTc in the 600s. - Previous EKGs reviewed.  EKG from 4/20 unremarkable.  EKG from 4/18 with QTc of 499.  EKG from 3/27, unremarkable. - Overall there is some concern for prolonged QT, though I do not think it is likely a QTc of 600 will be demonstrated on subsequent EKGs. - Primary care provider referred to cardiology.  It appears to still be pending.  I gave the patient the phone number to reach out to this clinic.  Collaboration of Care: Collaboration of Care: Medication Management AEB Rx ordered and adjusted, therapy in place with Paige Cozart.  Patient/Guardian was advised Release of Information must be obtained prior to any record release in order to collaborate their care with an outside provider. Patient/Guardian was advised if they have not already done so to contact the registration department to sign all necessary forms in order for us  to release information regarding their care.   Consent: Patient/Guardian gives verbal consent for treatment and assignment of benefits for services provided during this visit. Patient/Guardian expressed understanding and agreed to proceed.    Sharlot Becker, NP 02/21/2024, 3:51 PM

## 2024-02-26 DIAGNOSIS — S43431A Superior glenoid labrum lesion of right shoulder, initial encounter: Secondary | ICD-10-CM

## 2024-02-26 DIAGNOSIS — M65911 Unspecified synovitis and tenosynovitis, right shoulder: Secondary | ICD-10-CM

## 2024-02-26 DIAGNOSIS — M7521 Bicipital tendinitis, right shoulder: Secondary | ICD-10-CM

## 2024-02-27 ENCOUNTER — Ambulatory Visit (HOSPITAL_COMMUNITY): Payer: MEDICAID | Admitting: Clinical

## 2024-03-06 ENCOUNTER — Other Ambulatory Visit (HOSPITAL_COMMUNITY): Payer: Self-pay

## 2024-03-14 ENCOUNTER — Ambulatory Visit (INDEPENDENT_AMBULATORY_CARE_PROVIDER_SITE_OTHER): Payer: MEDICAID | Admitting: Orthopedic Surgery

## 2024-03-14 ENCOUNTER — Encounter: Payer: Self-pay | Admitting: Orthopedic Surgery

## 2024-03-14 ENCOUNTER — Other Ambulatory Visit: Payer: Self-pay

## 2024-03-14 DIAGNOSIS — Z9889 Other specified postprocedural states: Secondary | ICD-10-CM

## 2024-03-14 DIAGNOSIS — M75121 Complete rotator cuff tear or rupture of right shoulder, not specified as traumatic: Secondary | ICD-10-CM

## 2024-03-14 MED ORDER — HYDROCODONE-ACETAMINOPHEN 5-325 MG PO TABS: 1.0000 | ORAL_TABLET | Freq: Three times a day (TID) | ORAL | 0 refills | Status: DC | PRN

## 2024-03-14 NOTE — Progress Notes (Signed)
 Post-Op Visit Note   Patient: Maria Blackburn           Date of Birth: 14-Jun-1971           MRN: [id number] Visit Date: 03/14/2024 PCP: Lorren Greig PARAS, NP   Assessment & Plan:  Chief Complaint:  Chief Complaint  Patient presents with   Right Shoulder - Routine Post Op      RIGHT SHOULDER SCOPE (surgery date 02-07-24) Deb of superior labrum, release of BT, release of RI with deb, MOBT and RCTR     Visit Diagnoses:  1. Nontraumatic complete tear of right rotator cuff     Plan: Jashae is a patient is now about 4 weeks out right shoulder rotator cuff repair biceps tenodesis as well as some debridement for early adhesive capsulitis.  Has not really started physical therapy yet.  On exam she is a little on the stiff side with range of motion of 30/70/90.  Rotator cuff strength is actually intact and there is no coarseness with passive range of motion plan is to start home exercise program with sheets provided.  Will try physical therapy retry to start this week hydrocodone  refilled 4-week return for clinical recheck.  Currently she is taking Tylenol  and ibuprofen .  I do want her to really try to work on getting her range of motion a little bit better.  Overall the cuff repair feels good.  Follow-Up Instructions: Return in about 4 weeks (around 04/11/2024).   Orders:  No orders of the defined types were placed in this encounter.  Meds ordered this encounter  Medications   HYDROcodone -acetaminophen  (NORCO/VICODIN) 5-325 MG tablet    Sig: Take 1 tablet by mouth every 8 (eight) hours as needed for moderate pain (pain score 4-6).    Dispense:  30 tablet    Refill:  0    Imaging: No results found.  PMFS History: Patient Active Problem List   Diagnosis Date Noted   Degenerative superior labral anterior-to-posterior (SLAP) tear of right shoulder 02/26/2024   Synovitis of right shoulder 02/26/2024   Biceps tendonitis, right 02/26/2024   Major depressive disorder, recurrent episode,  moderate (HCC) 02/21/2024   PTSD (post-traumatic stress disorder) 10/22/2023   MDD (major depressive disorder), recurrent, severe, with psychosis (HCC) 10/21/2023   Acute psychosis (HCC) 09/29/2023   GAD (generalized anxiety disorder) 09/29/2023   Adhesive capsulitis of right shoulder 09/16/2023   Nontraumatic complete tear of right rotator cuff 09/16/2023   De Quervain's tenosynovitis, left 09/10/2021   Lateral epicondylitis, right elbow 09/10/2021   Allergy to environmental factors 10/07/2017   Past Medical History:  Diagnosis Date   Anxiety    Bladder spasms    Frequency of urination    GERD (gastroesophageal reflux disease)    Interstitial cystitis    Pelvic pain    SUI (stress urinary incontinence, female)    Urgency of urination     Family History  Problem Relation Age of Onset   Heart disease Mother    Diabetes Son     Past Surgical History:  Procedure Laterality Date   ABDOMINAL HYSTERECTOMY  2000   ovaries remain   BICEPT TENODESIS Right 02/07/2024   Procedure: TENODESIS, BICEPS, RIGHT;  Surgeon: Addie Cordella Hamilton, MD;  Location: [facility] OR;  Service: Orthopedics;  Laterality: Right;   CYSTO WITH HYDRODISTENSION N/A 03/24/2017   Procedure: CYSTOSCOPY/HYDRODISTENSION INSTILLATION OF MARCAINE  AND PYRIDIUM ;  Surgeon: Watt Rush, MD;  Location: [facility];  Service: Urology;  Laterality: N/A;  HYSTEROSCOPY WITH D & C  1999   POLYPECTOMY   POSTERIOR LUMBAR FUSION 2 WITH HARDWARE REMOVAL Right 02/07/2024   Procedure: ARTHROSCOPY, SHOULDER WITH DEBRIDEMENT, RIGHT;  Surgeon: Addie Cordella Hamilton, MD;  Location: [facility] OR;  Service: Orthopedics;  Laterality: Right;  right shoulder arthroscopy, debridement, biceps tenodesis, mini open rotator cuff tear repair   SHOULDER OPEN ROTATOR CUFF REPAIR Right 02/07/2024   Procedure: REPAIR, ROTATOR CUFF, OPEN, RIGHT;  Surgeon: Addie Cordella Hamilton, MD;  Location: [facility] OR;  Service: Orthopedics;  Laterality: Right;   TUBAL LIGATION  Bilateral 1996   Social History   Occupational History   Not on file  Tobacco Use   Smoking status: Never   Smokeless tobacco: Never  Vaping Use   Vaping status: Never Used  Substance and Sexual Activity   Alcohol use: No   Drug use: No   Sexual activity: Yes

## 2024-03-15 NOTE — Progress Notes (Signed)
 BH MD Outpatient Progress Note  Date of visit: 12/22/2023 Maria Blackburn  MRN:  [id number]  Televisit via video: I connected with patient on 03/29/24 at  3:00 PM EDT by a video enabled telemedicine application and verified that I am speaking with the correct person using two identifiers.  Location: Patient: Home Provider: Clinic   I discussed the limitations of evaluation and management by telemedicine and the availability of in person appointments. The patient expressed understanding and agreed to proceed.  I discussed the assessment and treatment plan with the patient. The patient was provided an opportunity to ask questions and all were answered. The patient agreed with the plan and demonstrated an understanding of the instructions.   The patient was advised to call back or seek an in-person evaluation if the symptoms worsen or if the condition fails to improve as anticipated.  Assessment:  Maria Blackburn presents for follow-up evaluation.  She has been seen by different providers over the past few months.  She has been fairly stable on the current dose of medications and was recently prescribed Prozac  for continued depression and anxiety.  Her depression and anxiety have improved with the addition of Prozac .  She has been eating and sleeping well.  When inquiring about her psychosis she was able to elicit stressful factors leading to loss of sleep and in turn leading to psychotic features.  Which she has not experienced since April.  She does not have any hallucinations, is not actively or passively suicidal or homicidal.  She has not been using any substances including alcohol or cigarettes.  She enjoys music and takes care of her nephew and her children.  Her nephew was recently diagnosed with autism which caused her distress.  She is not having any physical concerns.  We discussed about slowly tapering off Zoloft  since she has had limited benefit on it and increasing Prozac  since her anxiety  and depression have improved with the addition of Prozac , and we would not want her to be on 2 antidepressants, however would start the taper in the next visit since patient was not amenable currently.  No med changes today.  We will plan to follow-up with her in 4 to 6 weeks.  Identifying Information: Maria Blackburn is a 53 year old female with a history of MDD with psychotic features who is an established patient with [facility] for management of auditory hallucinations and recent psychiatric hospitalizations.   Plan:  # MDD with psychotic features Interventions: -- Continue Abilify  15 mg daily - Continue trazodone  50 mg nightly - Continue Zoloft  200 mg daily -- Continue Abilify  10 mg daily  #Long term use of antipsychotic medication -- Lipid panel from 10/2023 - A1c from 10/2023 - See discussion of QT interval below and EKGs - No tardive movements noted on assessment today - No weight gain on Abilify  so far  # Prolonged QT interval - Noted on EKG from 4/21 at the emergency department.  QTc B noted to be in the high 600s.  Correction with alternative formula also yields a QTc in the 600s. - Previous EKGs reviewed.  EKG from 4/20 unremarkable.  EKG from 4/18 with QTc of 499.  EKG from 3/27, unremarkable. - Overall there is some concern for prolonged QT, though I do not think it is likely a QTc of 600 will be demonstrated on subsequent EKGs. - Primary care provider referred to cardiology.  It appears to still be pending.  I gave the patient the  phone number to reach out to this clinic.   Patient was given contact information for behavioral health clinic and was instructed to call 911 for emergencies.   Subjective:  Chief Complaint:  Chief Complaint  Patient presents with   Follow-up   Medication Refill   Depression   Anxiety    Interval History:  Patient presented to the clinic in August, feeling depressed and anxious and was started on Prozac  in  addition to Zoloft , trazodone , Abilify . Today, the patient presented via televisit appointment.  She reported her mood as doing good .  She denied any active or passive SI/HI/AVH.  When asked later she stated that I have been doing upbeat, pretty good .  Reported that her depression and anxiety have been better on the current dose of medications.  She denied any side effects.  She reported her sleep as better , and her appetite is good .  She denied any nightmares or flashbacks, reported that sometimes she wakes up in the middle of the night due to pain.  When asked about visual hallucinations she stated no voices now, stated that she had that in April due to stressful factors including lack of sleep, had a nephew that was diagnosed with autism, a lot of stress, my cat had given birth at .  She spends her time taking care of her nephew, grocery shopping, cooking and enjoys music.  Patient stated that she would like to continue same set of medications, discussed with her about slowly tapering off Zoloft  and increasing Prozac  since she has had minimal benefit after being on Zoloft  alone.  Patient was amenable but stated that she would do that in the subsequent visit.  There are no safety concerns.  Discussed the risk benefits and side effects of her medications.  Plan to follow-up with her in 4 to 6 weeks.  Visit Diagnosis:    ICD-10-CM   1. GAD (generalized anxiety disorder)  F41.1     2. MDD (major depressive disorder), recurrent, severe, with psychosis (HCC)  F33.3     3. PTSD (post-traumatic stress disorder)  F43.10        Past Psychiatric History: None prior to psychiatric hospitalizations  Past Medical History:  Past Medical History:  Diagnosis Date   Anxiety    Bladder spasms    Frequency of urination    GERD (gastroesophageal reflux disease)    Interstitial cystitis    Pelvic pain    SUI (stress urinary incontinence, female)    Urgency of urination     Past Surgical  History:  Procedure Laterality Date   ABDOMINAL HYSTERECTOMY  2000   ovaries remain   BICEPT TENODESIS Right 02/07/2024   Procedure: TENODESIS, BICEPS, RIGHT;  Surgeon: Addie Cordella Hamilton, MD;  Location: [facility] OR;  Service: Orthopedics;  Laterality: Right;   CYSTO WITH HYDRODISTENSION N/A 03/24/2017   Procedure: CYSTOSCOPY/HYDRODISTENSION INSTILLATION OF MARCAINE  AND PYRIDIUM ;  Surgeon: Watt Rush, MD;  Location: [facility];  Service: Urology;  Laterality: N/A;   HYSTEROSCOPY WITH D & C  1999   POLYPECTOMY   POSTERIOR LUMBAR FUSION 2 WITH HARDWARE REMOVAL Right 02/07/2024   Procedure: ARTHROSCOPY, SHOULDER WITH DEBRIDEMENT, RIGHT;  Surgeon: Addie Cordella Hamilton, MD;  Location: [facility] OR;  Service: Orthopedics;  Laterality: Right;  right shoulder arthroscopy, debridement, biceps tenodesis, mini open rotator cuff tear repair   SHOULDER OPEN ROTATOR CUFF REPAIR Right 02/07/2024   Procedure: REPAIR, ROTATOR CUFF, OPEN, RIGHT;  Surgeon: Addie Cordella Hamilton, MD;  Location:  [facility] OR;  Service: Orthopedics;  Laterality: Right;   TUBAL LIGATION Bilateral 1996    Family Psychiatric History: None pertinent  Family History:  Family History  Problem Relation Age of Onset   Heart disease Mother    Diabetes Son     Social History:  Social History   Socioeconomic History   Marital status: Married    Spouse name: Not on file   Number of children: Not on file   Years of education: Not on file   Highest education level: Not on file  Occupational History   Not on file  Tobacco Use   Smoking status: Never   Smokeless tobacco: Never  Vaping Use   Vaping status: Never Used  Substance and Sexual Activity   Alcohol use: No   Drug use: No   Sexual activity: Yes  Other Topics Concern   Not on file  Social History Narrative   Not on file   Social Drivers of Health   Financial Resource Strain: Low Risk  (03/25/2023)   Overall Financial Resource Strain (CARDIA)    Difficulty of Paying Living  Expenses: Not very hard  Food Insecurity: No Food Insecurity (10/21/2023)   Hunger Vital Sign    Worried About Running Out of Food in the Last Year: Never true    Ran Out of Food in the Last Year: Never true  Transportation Needs: No Transportation Needs (10/21/2023)   PRAPARE - Administrator, Civil Service (Medical): No    Lack of Transportation (Non-Medical): No  Physical Activity: Patient Unable To Answer (03/25/2023)   Exercise Vital Sign    Days of Exercise per Week: Patient unable to answer    Minutes of Exercise per Session: Patient unable to answer  Stress: Patient Unable To Answer (03/25/2023)   [employer] of Occupational Health - Occupational Stress Questionnaire    Feeling of Stress : Patient unable to answer  Social Connections: Socially Integrated (10/21/2023)   Social Connection and Isolation Panel    Frequency of Communication with Friends and Family: More than three times a week    Frequency of Social Gatherings with Friends and Family: More than three times a week    Attends Religious Services: More than 4 times per year    Active Member of [employer] or Organizations: Yes    Attends Engineer, structural: More than 4 times per year    Marital Status: Married    Allergies: No Known Allergies  Current Medications: Current Outpatient Medications  Medication Sig Dispense Refill   acetaminophen  (TYLENOL ) 500 MG tablet Take 1,000 mg by mouth every 6 (six) hours as needed for mild pain (pain score 1-3).     ARIPiprazole  (ABILIFY ) 15 MG tablet Take 1 tablet (15 mg total) by mouth daily. 30 tablet 3   atorvastatin  (LIPITOR) 20 MG tablet Take 1 tablet (20 mg total) by mouth daily. 30 tablet 0   celecoxib  (CELEBREX ) 100 MG capsule Take 1 capsule (100 mg total) by mouth daily. 60 capsule 0   Cyanocobalamin  (VITAMIN B-12) 5000 MCG TBDP Take 500 mcg by mouth daily.     FLUoxetine  (PROZAC ) 10 MG capsule Take 1 capsule (10 mg total) by mouth daily. 30 capsule 0    HYDROcodone -acetaminophen  (NORCO/VICODIN) 5-325 MG tablet Take 1 tablet by mouth every 8 (eight) hours as needed for moderate pain (pain score 4-6). 30 tablet 0   methocarbamol  (ROBAXIN ) 500 MG tablet Take 1 tablet (500 mg total) by mouth every 8 (eight)  hours as needed for muscle spasms. 30 tablet 1   oxyCODONE  (ROXICODONE ) 5 MG immediate release tablet Take 1 tablet (5 mg total) by mouth every 4 (four) hours as needed for severe pain (pain score 7-10). 30 tablet 0   sertraline  (ZOLOFT ) 100 MG tablet Take 2 tablets (200 mg total) by mouth daily. 60 tablet 3   solifenacin  (VESICARE ) 10 MG tablet Take 1 tablet (10 mg total) by mouth daily. 30 tablet 11   traZODone  (DESYREL ) 50 MG tablet Take 1 tablet (50 mg total) by mouth at bedtime. 30 tablet 3   No current facility-administered medications for this visit.     Objective:  Psychiatric Specialty Exam: Physical Exam Constitutional:      Appearance: the patient is not toxic-appearing.  Pulmonary:     Effort: Pulmonary effort is normal.  Neurological:     General: No focal deficit present.     Mental Status: the patient is alert and oriented to person, place, and time.   Review of Systems  Respiratory:  Negative for shortness of breath.   Cardiovascular:  Negative for chest pain.  Gastrointestinal:  Negative for abdominal pain, constipation, diarrhea, nausea and vomiting.  Neurological:  Negative for headaches.      There were no vitals taken for this visit.  General Appearance: Fairly Groomed  Eye Contact:  Good  Speech:  Clear and Coherent  Volume:  Normal  Mood:  Euthymic  Affect:  Congruent  Thought Process:  Coherent  Orientation:  Full (Time, Place, and Person)  Thought Content: Logical   Suicidal Thoughts:  No  Homicidal Thoughts:  No  Memory:  Immediate;   Good  Judgement:  fair  Insight:  fair  Psychomotor Activity:  Normal  Concentration:  Concentration: Good  Recall:  Good  Fund of Knowledge: Good  Language:  Good  Akathisia:  No  Handed:    AIMS (if indicated): not done  Assets:  Communication Skills Desire for Improvement Financial Resources/Insurance Housing Leisure Time Physical Health  ADL's:  Intact  Cognition: WNL       Metabolic Disorder Labs: Lab Results  Component Value Date   HGBA1C 5.9 (H) 07/07/2023   No results found for: PROLACTIN Lab Results  Component Value Date   CHOL 247 (H) 08/12/2023   TRIG 135 08/12/2023   HDL 54 08/12/2023   CHOLHDL 4.6 (H) 08/12/2023   VLDL 18 03/20/2016   LDLCALC 169 (H) 08/12/2023   LDLCALC 147 (H) 07/07/2023   Lab Results  Component Value Date   TSH 0.964 10/05/2023   TSH 0.262 (L) 09/29/2023    Therapeutic Level Labs: No results found for: LITHIUM No results found for: VALPROATE No results found for: CBMZ  Screenings: AUDIT    Flowsheet Row [facility] ([facility]  Alcohol Use Disorder Identification Test Final Score (AUDIT) 0 0   GAD-7    Flowsheet Row Office Visit from 11/04/2023 in [facility] Office Visit from 10/31/2023 in [facility] Office Visit from 02/02/2023 in [facility]  Total GAD-7 Score 0 3 1   PHQ2-9    Flowsheet Row Video Visit from 03/29/2024 in [facility] Office Visit from 11/04/2023 in [facility] Office Visit from 10/31/2023 in [facility] ED from 09/29/2023 in [facility] Office Visit from 04/04/2023 in [facility] at [facility]  PHQ-2 Total Score 0 0 1 2 4   PHQ-9 Total Score -- 3 4 9 12    Flowsheet Row Admission (Discharged) from 02/07/2024 in [facility] [facility] Office Visit from 10/31/2023 in [facility] Ad[facility] (Discharged[facility]  C-SSRS RISK CATEGORY No Risk No Risk No Risk    Collaboration of Care: none  A total of 30 minutes was spent involved in face to face clinical care, chart review, documentation.   Argus Caraher, MD 03/29/2024, 3:12 PM

## 2024-03-19 ENCOUNTER — Ambulatory Visit (HOSPITAL_COMMUNITY): Payer: MEDICAID | Admitting: Clinical

## 2024-03-19 ENCOUNTER — Other Ambulatory Visit (HOSPITAL_COMMUNITY): Payer: Self-pay | Admitting: Psychiatry

## 2024-03-19 ENCOUNTER — Encounter (HOSPITAL_COMMUNITY): Payer: Self-pay

## 2024-03-20 ENCOUNTER — Encounter: Payer: Self-pay | Admitting: Physical Therapy

## 2024-03-20 ENCOUNTER — Ambulatory Visit: Payer: MEDICAID | Attending: Orthopedic Surgery | Admitting: Physical Therapy

## 2024-03-20 ENCOUNTER — Other Ambulatory Visit: Payer: Self-pay

## 2024-03-20 DIAGNOSIS — M6281 Muscle weakness (generalized): Secondary | ICD-10-CM | POA: Diagnosis present

## 2024-03-20 DIAGNOSIS — M25511 Pain in right shoulder: Secondary | ICD-10-CM | POA: Insufficient documentation

## 2024-03-20 DIAGNOSIS — R600 Localized edema: Secondary | ICD-10-CM | POA: Insufficient documentation

## 2024-03-20 DIAGNOSIS — Z9889 Other specified postprocedural states: Secondary | ICD-10-CM | POA: Diagnosis not present

## 2024-03-20 NOTE — Therapy (Signed)
 OUTPATIENT PHYSICAL THERAPY SHOULDER EVALUATION   Patient Name: Maria Blackburn MRN: [id number] DOB:Jun 24, 1971, 53 y.o., female Today's Date: 03/20/2024   PT End of Session - 03/20/24 1409     Visit Number 1    Number of Visits --   1-2x/week   Date for PT Re-Evaluation 05/15/24    Authorization Type [employer]  MCD    PT Start Time 1330    PT Stop Time 1405    PT Time Calculation (min) 35 min          Past Medical History:  Diagnosis Date   Anxiety    Bladder spasms    Frequency of urination    GERD (gastroesophageal reflux disease)    Interstitial cystitis    Pelvic pain    SUI (stress urinary incontinence, female)    Urgency of urination    Past Surgical History:  Procedure Laterality Date   ABDOMINAL HYSTERECTOMY  2000   ovaries remain   BICEPT TENODESIS Right 02/07/2024   Procedure: TENODESIS, BICEPS, RIGHT;  Surgeon: Addie Cordella Hamilton, MD;  Location: [facility] OR;  Service: Orthopedics;  Laterality: Right;   CYSTO WITH HYDRODISTENSION N/A 03/24/2017   Procedure: CYSTOSCOPY/HYDRODISTENSION INSTILLATION OF MARCAINE  AND PYRIDIUM ;  Surgeon: Watt Rush, MD;  Location: [facility];  Service: Urology;  Laterality: N/A;   HYSTEROSCOPY WITH D & C  1999   POLYPECTOMY   POSTERIOR LUMBAR FUSION 2 WITH HARDWARE REMOVAL Right 02/07/2024   Procedure: ARTHROSCOPY, SHOULDER WITH DEBRIDEMENT, RIGHT;  Surgeon: Addie Cordella Hamilton, MD;  Location: [facility] OR;  Service: Orthopedics;  Laterality: Right;  right shoulder arthroscopy, debridement, biceps tenodesis, mini open rotator cuff tear repair   SHOULDER OPEN ROTATOR CUFF REPAIR Right 02/07/2024   Procedure: REPAIR, ROTATOR CUFF, OPEN, RIGHT;  Surgeon: Addie Cordella Hamilton, MD;  Location: [facility] OR;  Service: Orthopedics;  Laterality: Right;   TUBAL LIGATION Bilateral 1996   Patient Active Problem List   Diagnosis Date Noted   Degenerative superior labral anterior-to-posterior (SLAP) tear of right shoulder 02/26/2024   Synovitis  of right shoulder 02/26/2024   Biceps tendonitis, right 02/26/2024   Major depressive disorder, recurrent episode, moderate (HCC) 02/21/2024   PTSD (post-traumatic stress disorder) 10/22/2023   MDD (major depressive disorder), recurrent, severe, with psychosis (HCC) 10/21/2023   Acute psychosis (HCC) 09/29/2023   GAD (generalized anxiety disorder) 09/29/2023   Adhesive capsulitis of right shoulder 09/16/2023   Nontraumatic complete tear of right rotator cuff 09/16/2023   De Quervain's tenosynovitis, left 09/10/2021   Lateral epicondylitis, right elbow 09/10/2021   Allergy to environmental factors 10/07/2017    PCP: Lorren Greig PARAS, NP  REFERRING PROVIDER: Addie Cordella Hamilton, MD  THERAPY DIAG:  Right shoulder pain, unspecified chronicity  Muscle weakness  Localized edema  REFERRING DIAG: S/P shoulder surgery [Z98.890]   Rationale for Evaluation and Treatment:  Rehabilitation  SUBJECTIVE:  PERTINENT PAST HISTORY:  MDD, PTSD      PRECAUTIONS:   right shoulder rotator cuff repair biceps tenodesis and debridement for early adhesive capsulitis     OP Date: 02/07/2024  2 weeks 02/21/2024  4 weeks 03/06/2024  6 weeks 03/20/2024  8 weeks 04/03/2024  10 weeks 04/17/2024  12 weeks 05/01/2024    WEIGHT BEARING RESTRICTIONS Yes    FALLS:  Has patient fallen in last 6 months? No, Number of falls: 0  MOI/History of condition:  Onset date: 8/5  SUBJECTIVE STATEMENT  Pt is a 53 y.o. female who presents to clinic with chief complaint  of R shoulder pain and stiffness following R/C repair, debridement, and biceps tenodesis.  She has generally followed precautions but was not thinking an picked up her 35# grandchild yesterday and has been achy since.  Her pain is intermittent and achy.  Having some issue with ROM.  Previous history of AC.  Pain:  Are you having pain? Yes Pain location: R shoulder pain NPRS scale:  Best: 5/10, Worst: 10/10 Aggravating factors: stretching it out,  texting, writing Relieving factors: rest Pain description: aching  Occupation: Licensed conveyancer Device: na  Hand Dominance: R  Patient Goals/Specific Activities: reduce pain, be able to use R arm   OBJECTIVE:   DIAGNOSTIC FINDINGS:  NA  GENERAL OBSERVATION: Rounded shoulders     SENSATION: Light touch: Appears intact   UPPER EXTREMITY AROM:  ROM Right (Eval) Left (Eval)  Shoulder flexion 70* 155  Shoulder abduction 30* significant shoulder hike 150  Shoulder internal rotation    Shoulder external rotation    Functional IR    Functional ER    Shoulder extension    Elbow extension    Elbow flexion     (Blank rows = not tested, N = WNL, * = concordant pain with testing)  UPPER EXTREMITY MMT:  MMT Right (Eval) Left (Eval)  Shoulder flexion    Shoulder abduction (C5)    Shoulder ER    Shoulder IR    Middle trapezius    Lower trapezius    Shoulder extension    Grip strength    Cervical flexion (C1,C2)    Cervical S/B (C3)    Shoulder shrug (C4)    Elbow flexion (C6)    Elbow ext (C7)    Thumb ext (C8)    Finger abd (T1)    Grossly     (Blank rows = not tested, score listed is out of 5 possible points.  N = WNL, D = diminished, C = clear for gross weakness with myotome testing, * = concordant pain with testing)   UPPER EXTREMITY PROM:  PROM Right (Eval) Left (Eval)  Shoulder flexion 120   Shoulder abduction    Shoulder internal rotation 30   Shoulder external rotation 20 neutral   Functional IR    Functional ER    Shoulder extension    Elbow extension    Elbow flexion     (Blank rows = not tested, N = WNL, * = concordant pain with testing)  JOINT MOBILITY TESTING:  Hypomobile GH   PATIENT SURVEYS:  QuickDASH Score: 61.4 / 100 = 61.4 %    TODAY'S TREATMENT:  Therapeutic Exercise: Creating, reviewing, and completing below HEP   PATIENT EDUCATION ([place]/HM):  POC, diagnosis, prognosis, HEP, and outcome measures.  Pt educated via  explanation, demonstration, and handout (HEP).  Pt confirms understanding verbally.   HOME EXERCISE PROGRAM: Access Code: [email or web address] URL: [email or web address] Date: 03/20/2024 Prepared by: Helene Gasmen  Exercises - Supine Shoulder Press AAROM in Abduction with Dowel  - 2 x daily - 7 x weekly - 2 sets - 10 reps - Supine Shoulder Flexion Extension AAROM with Dowel  - 2 x daily - 7 x weekly - 2 sets - 10 reps - Supine Shoulder External Rotation and Internal Rotation PROM with Caregiver  - 2 x daily - 7 x weekly - 2 sets - 10 reps - Seated Shoulder Flexion Towel Slide at Table Top  - 2 x daily - 7 x weekly - 2 sets - 10 reps  Treatment priorities  Eval        ROM        Passive -> AAROM -> AROM                                  ASSESSMENT:  CLINICAL IMPRESSION: Maria Blackburn is a 53 y.o. female who presents to clinic with signs and sxs consistent with R shoulder stiffness and pain following biceps tenodesis, R/C repair, and biceps tenodesis on 8/5.   Fairly stiff with significantly more PROM vs AROM.   Maria Blackburn will benefit from skilled PT to address relevant deficits and improve shoulder function and comfort.   OBJECTIVE IMPAIRMENTS: Pain, R shoulder ROM, R shoulder strength  ACTIVITY LIMITATIONS: reaching, lifting, childcare, housework, driving, school work, typing  PERSONAL FACTORS: See medical history and pertinent history   REHAB POTENTIAL: Good  CLINICAL DECISION MAKING: Evolving/moderate complexity  EVALUATION COMPLEXITY: Moderate   GOALS:   SHORT TERM GOALS: Target date: 04/17/2024   Eric will be >75% HEP compliant to improve carryover between sessions and facilitate independent management of condition  Evaluation: ongoing Goal status: INITIAL   LONG TERM GOALS: Target date: 05/15/2024   Maria Blackburn will self report >/= 50% decrease in pain from evaluation to improve function in daily tasks  Evaluation/Baseline: 10/10 max pain Goal status:  INITIAL   2.  Maria Blackburn will show a >/= 30 pct improvement in her QUICK DASH score (MCID is 10% or ~5 pts) as a proxy for functional improvement   Evaluation/Baseline: QuickDASH Score: 61.4 / 100 = 61.4 % Goal status: INITIAL   3.  Maria Blackburn will be able to care for nephew, including lifting, not limited by pain  Evaluation/Baseline: limited Goal status: INITIAL   4.  Maria Blackburn will demonstrate >130 degrees of active ROM in flexion to allow completion of activities involving reaching OH, not limited by pain  Evaluation/Baseline: 70 degrees Goal status: INITIAL   5.  Maria Blackburn will be able to complete school work, not limited by pain  Evaluation/Baseline: limited Goal status: INITIAL   6.  Maria Blackburn will improve the following MMTs to >/= 4/5 to show improvement in strength:  flexion and ER   Evaluation/Baseline: not tested Goal status: INITIAL   PLAN: PT FREQUENCY: 1-2x/week  PT DURATION: 8 weeks  PLANNED INTERVENTIONS:  [id number]- PT Re-evaluation, 97110-Therapeutic exercises, 97530- Therapeutic activity, 97112- Neuromuscular re-education, 97535- Self Care, 02859- Manual therapy, [id number]- Gait training, [id number]- Aquatic Therapy, [phone]- Electrical stimulation (manual), [id number]- Vasopneumatic device, [id number]- Traction (mechanical), [id number]- Ionotophoresis 4mg /ml Dexamethasone , Taping, Dry Needling, Joint manipulation, and Spinal manipulation.   Helene Gasmen PT, DPT 03/20/2024, 2:11 PM  I just finished a MCD eval/recert.  Name: Maria Blackburn  MRN: [id number] Please request 2x/week for 8 weeks.  Check all conditions that are expected to impact treatment: Musculoskeletal disorders   I DID put a charge in.  Check all possible CPT codes: 02889- Therapeutic Exercise, [phone]- Neuro Re-education, [phone] - Gait Training, [phone] - Manual Therapy, 97530 - Therapeutic Activities, 97535 - Self Care, [phone] - Re-evaluation, [id number] - Mechanical traction, and [id number] - Aquatic therapy   Thank you!  MCD -  Secure

## 2024-03-27 ENCOUNTER — Encounter: Payer: Self-pay | Admitting: Physical Therapy

## 2024-03-27 ENCOUNTER — Ambulatory Visit: Payer: MEDICAID | Admitting: Physical Therapy

## 2024-03-27 DIAGNOSIS — M6281 Muscle weakness (generalized): Secondary | ICD-10-CM

## 2024-03-27 DIAGNOSIS — R600 Localized edema: Secondary | ICD-10-CM

## 2024-03-27 DIAGNOSIS — M25511 Pain in right shoulder: Secondary | ICD-10-CM | POA: Diagnosis not present

## 2024-03-27 NOTE — Therapy (Signed)
 OUTPATIENT PHYSICAL THERAPY SHOULDER EVALUATION   Patient Name: Maria Blackburn MRN: [id number] DOB:04/09/1971, 53 y.o., female Today's Date: 03/27/2024   PT End of Session - 03/27/24 1331     Visit Number 2    Number of Visits --   1-2x/week   Date for Recertification  05/15/24    Authorization Type [employer]  MCD    PT Start Time 1330    PT Stop Time 1411    PT Time Calculation (min) 41 min          Past Medical History:  Diagnosis Date   Anxiety    Bladder spasms    Frequency of urination    GERD (gastroesophageal reflux disease)    Interstitial cystitis    Pelvic pain    SUI (stress urinary incontinence, female)    Urgency of urination    Past Surgical History:  Procedure Laterality Date   ABDOMINAL HYSTERECTOMY  2000   ovaries remain   BICEPT TENODESIS Right 02/07/2024   Procedure: TENODESIS, BICEPS, RIGHT;  Surgeon: Addie Cordella Hamilton, MD;  Location: [facility] OR;  Service: Orthopedics;  Laterality: Right;   CYSTO WITH HYDRODISTENSION N/A 03/24/2017   Procedure: CYSTOSCOPY/HYDRODISTENSION INSTILLATION OF MARCAINE  AND PYRIDIUM ;  Surgeon: Watt Rush, MD;  Location: [facility];  Service: Urology;  Laterality: N/A;   HYSTEROSCOPY WITH D & C  1999   POLYPECTOMY   POSTERIOR LUMBAR FUSION 2 WITH HARDWARE REMOVAL Right 02/07/2024   Procedure: ARTHROSCOPY, SHOULDER WITH DEBRIDEMENT, RIGHT;  Surgeon: Addie Cordella Hamilton, MD;  Location: [facility] OR;  Service: Orthopedics;  Laterality: Right;  right shoulder arthroscopy, debridement, biceps tenodesis, mini open rotator cuff tear repair   SHOULDER OPEN ROTATOR CUFF REPAIR Right 02/07/2024   Procedure: REPAIR, ROTATOR CUFF, OPEN, RIGHT;  Surgeon: Addie Cordella Hamilton, MD;  Location: [facility] OR;  Service: Orthopedics;  Laterality: Right;   TUBAL LIGATION Bilateral 1996   Patient Active Problem List   Diagnosis Date Noted   Degenerative superior labral anterior-to-posterior (SLAP) tear of right shoulder 02/26/2024   Synovitis  of right shoulder 02/26/2024   Biceps tendonitis, right 02/26/2024   Major depressive disorder, recurrent episode, moderate (HCC) 02/21/2024   PTSD (post-traumatic stress disorder) 10/22/2023   MDD (major depressive disorder), recurrent, severe, with psychosis (HCC) 10/21/2023   Acute psychosis (HCC) 09/29/2023   GAD (generalized anxiety disorder) 09/29/2023   Adhesive capsulitis of right shoulder 09/16/2023   Nontraumatic complete tear of right rotator cuff 09/16/2023   De Quervain's tenosynovitis, left 09/10/2021   Lateral epicondylitis, right elbow 09/10/2021   Allergy to environmental factors 10/07/2017    PCP: Lorren Greig PARAS, NP  REFERRING PROVIDER: Addie Cordella Hamilton, MD  THERAPY DIAG:  Right shoulder pain, unspecified chronicity  Muscle weakness  Localized edema  REFERRING DIAG: S/P shoulder surgery [Z98.890]   Rationale for Evaluation and Treatment:  Rehabilitation  SUBJECTIVE:  PERTINENT PAST HISTORY:  MDD, PTSD      PRECAUTIONS:   right shoulder rotator cuff repair biceps tenodesis and debridement for early adhesive capsulitis     OP Date: 02/07/2024  2 weeks 02/21/2024  4 weeks 03/06/2024  6 weeks 03/20/2024  8 weeks 04/03/2024  10 weeks 04/17/2024  12 weeks 05/01/2024   Protocol:  [email or web address]  WEIGHT BEARING RESTRICTIONS Yes    FALLS:  Has patient fallen in last 6 months? No, Number of falls: 0  MOI/History of condition:  Onset date: 8/5  SUBJECTIVE STATEMENT  03/27/2024:  Pt reports that she is a little sore.  She has working on her ROM.  Eval:  Pt is a 53 y.o. female who presents to clinic with chief complaint of R shoulder pain and stiffness following R/C repair, debridement, and biceps tenodesis.  She has generally followed precautions but was not thinking an picked up her 35# grandchild yesterday and  has been achy since.  Her pain is intermittent and achy.  Having some issue with ROM.  Previous history of AC.  Pain:  Are you having pain? Yes Pain location: R shoulder pain NPRS scale:  Best: 5/10, Worst: 10/10 Aggravating factors: stretching it out, texting, writing Relieving factors: rest Pain description: aching  Occupation: Licensed conveyancer Device: na  Hand Dominance: R  Patient Goals/Specific Activities: reduce pain, be able to use R arm   OBJECTIVE:   DIAGNOSTIC FINDINGS:  NA  GENERAL OBSERVATION: Rounded shoulders     SENSATION: Light touch: Appears intact   UPPER EXTREMITY AROM:  ROM Right (Eval) Left (Eval)  Shoulder flexion 70* 155  Shoulder abduction 30* significant shoulder hike 150  Shoulder internal rotation    Shoulder external rotation    Functional IR    Functional ER    Shoulder extension    Elbow extension    Elbow flexion     (Blank rows = not tested, N = WNL, * = concordant pain with testing)  UPPER EXTREMITY MMT:  MMT Right (Eval) Left (Eval)  Shoulder flexion    Shoulder abduction (C5)    Shoulder ER    Shoulder IR    Middle trapezius    Lower trapezius    Shoulder extension    Grip strength    Cervical flexion (C1,C2)    Cervical S/B (C3)    Shoulder shrug (C4)    Elbow flexion (C6)    Elbow ext (C7)    Thumb ext (C8)    Finger abd (T1)    Grossly     (Blank rows = not tested, score listed is out of 5 possible points.  N = WNL, D = diminished, C = clear for gross weakness with myotome testing, * = concordant pain with testing)   UPPER EXTREMITY PROM:  PROM Right (Eval) Left (Eval)  Shoulder flexion 120   Shoulder abduction    Shoulder internal rotation 30   Shoulder external rotation 20 neutral   Functional IR    Functional ER    Shoulder extension    Elbow extension    Elbow flexion     (Blank rows = not tested, N = WNL, * = concordant pain with testing)  JOINT MOBILITY TESTING:  Hypomobile GH    PATIENT SURVEYS:  QuickDASH Score: 61.4 / 100 = 61.4 %    TODAY'S TREATMENT:                                                                                                                                [facility] Adult PT Treatment  03/27/2024:  Therapeutic Exercise:  AAROM chest press AAROM flexion non-painful arc PROM for flexion, scaption, ER Ball roll out on ramp UE ranger - 28''   PATIENT EDUCATION ([place]/HM):  POC, diagnosis, prognosis, HEP, and outcome measures.  Pt educated via explanation, demonstration, and handout (HEP).  Pt confirms understanding verbally.   HOME EXERCISE PROGRAM: Access Code: [email or web address] URL: [email or web address] Date: 03/20/2024 Prepared by: Helene Gasmen  Exercises - Supine Shoulder Press AAROM in Abduction with Dowel  - 2 x daily - 7 x weekly - 2 sets - 10 reps - Supine Shoulder Flexion Extension AAROM with Dowel  - 2 x daily - 7 x weekly - 2 sets - 10 reps - Supine Shoulder External Rotation and Internal Rotation PROM with Caregiver  - 2 x daily - 7 x weekly - 2 sets - 10 reps - Seated Shoulder Flexion Towel Slide at Table Top  - 2 x daily - 7 x weekly - 2 sets - 10 reps  Treatment priorities   Eval        ROM        Passive -> AAROM -> AROM as appropriate                                   ASSESSMENT:  CLINICAL IMPRESSION:  03/27/2024:  Maria Blackburn tolerated session well with no adverse reaction.  Working on shoulder ROM which shows improvement compared to eval.  AAROM in supine to >120 degrees with minimal pain.  ER ROM improved.  EVAL:  Maria Blackburn is a 53 y.o. female who presents to clinic with signs and sxs consistent with R shoulder stiffness and pain following biceps tenodesis, R/C repair, and biceps tenodesis on 8/5.   Fairly stiff with significantly more PROM vs AROM.   Maria Blackburn will benefit from skilled PT to address relevant deficits and improve shoulder function and comfort.   OBJECTIVE IMPAIRMENTS: Pain, R shoulder ROM, R  shoulder strength  ACTIVITY LIMITATIONS: reaching, lifting, childcare, housework, driving, school work, typing  PERSONAL FACTORS: See medical history and pertinent history   REHAB POTENTIAL: Good  CLINICAL DECISION MAKING: Evolving/moderate complexity  EVALUATION COMPLEXITY: Moderate   GOALS:   SHORT TERM GOALS: Target date: 04/17/2024   September will be >75% HEP compliant to improve carryover between sessions and facilitate independent management of condition  Evaluation: ongoing Goal status: INITIAL   LONG TERM GOALS: Target date: 05/15/2024   Maria Blackburn will self report >/= 50% decrease in pain from evaluation to improve function in daily tasks  Evaluation/Baseline: 10/10 max pain Goal status: INITIAL   2.  Maria Blackburn will show a >/= 30 pct improvement in her QUICK DASH score (MCID is 10% or ~5 pts) as a proxy for functional improvement   Evaluation/Baseline: QuickDASH Score: 61.4 / 100 = 61.4 % Goal status: INITIAL   3.  Maria Blackburn will be able to care for nephew, including lifting, not limited by pain  Evaluation/Baseline: limited Goal status: INITIAL   4.  Maria Blackburn will demonstrate >130 degrees of active ROM in flexion to allow completion of activities involving reaching OH, not limited by pain  Evaluation/Baseline: 70 degrees Goal status: INITIAL   5.  Maria Blackburn will be able to complete school work, not limited by pain  Evaluation/Baseline: limited Goal status: INITIAL   6.  Maria Blackburn will improve the following MMTs to >/= 4/5 to show improvement in strength:  flexion and ER   Evaluation/Baseline: not tested Goal status: INITIAL  PLAN: PT FREQUENCY: 1-2x/week  PT DURATION: 8 weeks  PLANNED INTERVENTIONS:  97164- PT Re-evaluation, 97110-Therapeutic exercises, 97530- Therapeutic activity, [id number]- Neuromuscular re-education, 97535- Self Care, 02859- Manual therapy, [id number]- Gait training, [id number]- Aquatic Therapy, [phone]- Electrical stimulation (manual), [id number]-  Vasopneumatic device, [id number]- Traction (mechanical), [id number]- Ionotophoresis 4mg /ml Dexamethasone , Taping, Dry Needling, Joint manipulation, and Spinal manipulation.   Helene Gasmen PT, DPT 03/27/2024, 2:13 PM  I just finished a MCD eval/recert.  Name: Maria Blackburn  MRN: [id number] Please request 2x/week for 8 weeks.  Check all conditions that are expected to impact treatment: Musculoskeletal disorders   I DID put a charge in.  Check all possible CPT codes: 02889- Therapeutic Exercise, [phone]- Neuro Re-education, [phone] - Gait Training, [phone] - Manual Therapy, 97530 - Therapeutic Activities, 97535 - Self Care, [phone] - Re-evaluation, [id number] - Mechanical traction, and [id number] - Aquatic therapy   Thank you!  MCD - Secure

## 2024-03-29 ENCOUNTER — Telehealth (INDEPENDENT_AMBULATORY_CARE_PROVIDER_SITE_OTHER): Payer: MEDICAID

## 2024-03-29 DIAGNOSIS — F411 Generalized anxiety disorder: Secondary | ICD-10-CM | POA: Diagnosis not present

## 2024-03-29 DIAGNOSIS — F333 Major depressive disorder, recurrent, severe with psychotic symptoms: Secondary | ICD-10-CM | POA: Diagnosis not present

## 2024-03-29 DIAGNOSIS — F4310 Post-traumatic stress disorder, unspecified: Secondary | ICD-10-CM | POA: Diagnosis not present

## 2024-03-29 MED ORDER — ARIPIPRAZOLE 15 MG PO TABS: 15.0000 mg | ORAL_TABLET | Freq: Every day | ORAL | 1 refills | Status: DC

## 2024-03-29 MED ORDER — SERTRALINE HCL 100 MG PO TABS: 200.0000 mg | ORAL_TABLET | Freq: Every day | ORAL | 1 refills | Status: DC

## 2024-03-29 MED ORDER — FLUOXETINE HCL 10 MG PO CAPS: 10.0000 mg | ORAL_CAPSULE | Freq: Every day | ORAL | 1 refills | Status: DC

## 2024-03-29 MED ORDER — TRAZODONE HCL 50 MG PO TABS: 50.0000 mg | ORAL_TABLET | Freq: Every day | ORAL | 1 refills | Status: DC

## 2024-03-30 ENCOUNTER — Ambulatory Visit: Payer: MEDICAID | Admitting: Physical Therapy

## 2024-04-03 ENCOUNTER — Ambulatory Visit: Payer: MEDICAID | Admitting: Physical Therapy

## 2024-04-03 ENCOUNTER — Encounter: Payer: Self-pay | Admitting: Physical Therapy

## 2024-04-03 DIAGNOSIS — M25511 Pain in right shoulder: Secondary | ICD-10-CM

## 2024-04-03 DIAGNOSIS — M6281 Muscle weakness (generalized): Secondary | ICD-10-CM

## 2024-04-03 DIAGNOSIS — R600 Localized edema: Secondary | ICD-10-CM

## 2024-04-03 NOTE — Therapy (Signed)
 OUTPATIENT PHYSICAL THERAPY SHOULDER TREATMENT   Patient Name: Maria Blackburn MRN: [id number] DOB:03-28-1971, 53 y.o., female Today's Date: 04/03/2024   PT End of Session - 04/03/24 1219     Visit Number 3    Number of Visits --   1-2x/week   Date for Recertification  05/15/24    Authorization Type [employer]  MCD    PT Start Time 1145    PT Stop Time 1225    PT Time Calculation (min) 40 min           Past Medical History:  Diagnosis Date   Anxiety    Bladder spasms    Frequency of urination    GERD (gastroesophageal reflux disease)    Interstitial cystitis    Pelvic pain    SUI (stress urinary incontinence, female)    Urgency of urination    Past Surgical History:  Procedure Laterality Date   ABDOMINAL HYSTERECTOMY  2000   ovaries remain   BICEPT TENODESIS Right 02/07/2024   Procedure: TENODESIS, BICEPS, RIGHT;  Surgeon: Addie Cordella Hamilton, MD;  Location: [facility] OR;  Service: Orthopedics;  Laterality: Right;   CYSTO WITH HYDRODISTENSION N/A 03/24/2017   Procedure: CYSTOSCOPY/HYDRODISTENSION INSTILLATION OF MARCAINE  AND PYRIDIUM ;  Surgeon: Watt Rush, MD;  Location: [facility];  Service: Urology;  Laterality: N/A;   HYSTEROSCOPY WITH D & C  1999   POLYPECTOMY   POSTERIOR LUMBAR FUSION 2 WITH HARDWARE REMOVAL Right 02/07/2024   Procedure: ARTHROSCOPY, SHOULDER WITH DEBRIDEMENT, RIGHT;  Surgeon: Addie Cordella Hamilton, MD;  Location: [facility] OR;  Service: Orthopedics;  Laterality: Right;  right shoulder arthroscopy, debridement, biceps tenodesis, mini open rotator cuff tear repair   SHOULDER OPEN ROTATOR CUFF REPAIR Right 02/07/2024   Procedure: REPAIR, ROTATOR CUFF, OPEN, RIGHT;  Surgeon: Addie Cordella Hamilton, MD;  Location: [facility] OR;  Service: Orthopedics;  Laterality: Right;   TUBAL LIGATION Bilateral 1996   Patient Active Problem List   Diagnosis Date Noted   Degenerative superior labral anterior-to-posterior (SLAP) tear of right shoulder 02/26/2024   Synovitis  of right shoulder 02/26/2024   Biceps tendonitis, right 02/26/2024   Major depressive disorder, recurrent episode, moderate (HCC) 02/21/2024   PTSD (post-traumatic stress disorder) 10/22/2023   MDD (major depressive disorder), recurrent, severe, with psychosis (HCC) 10/21/2023   Acute psychosis (HCC) 09/29/2023   GAD (generalized anxiety disorder) 09/29/2023   Adhesive capsulitis of right shoulder 09/16/2023   Nontraumatic complete tear of right rotator cuff 09/16/2023   De Quervain's tenosynovitis, left 09/10/2021   Lateral epicondylitis, right elbow 09/10/2021   Allergy to environmental factors 10/07/2017    PCP: Lorren Greig PARAS, NP  REFERRING PROVIDER: Jerri Kay HERO, MD  THERAPY DIAG:  Right shoulder pain, unspecified chronicity  Muscle weakness  Localized edema  REFERRING DIAG: S/P shoulder surgery [Z98.890]   Rationale for Evaluation and Treatment:  Rehabilitation  SUBJECTIVE:  PERTINENT PAST HISTORY:  MDD, PTSD      PRECAUTIONS:   right shoulder rotator cuff repair biceps tenodesis and debridement for early adhesive capsulitis     OP Date: 02/07/2024  2 weeks 02/21/2024  4 weeks 03/06/2024  6 weeks 03/20/2024  8 weeks 04/03/2024  10 weeks 04/17/2024  12 weeks 05/01/2024   Protocol:  [email or web address]  WEIGHT BEARING RESTRICTIONS Yes    FALLS:  Has patient fallen in last 6 months? No, Number of falls: 0  MOI/History of condition:  Onset date: 8/5  SUBJECTIVE STATEMENT  04/03/2024:  Pt reports the shoulder has been sore  since yesterday after changing a light bulb overhead.   Eval:  Pt is a 53 y.o. female who presents to clinic with chief complaint of R shoulder pain and stiffness following R/C repair, debridement, and biceps tenodesis.  She has generally followed precautions but was not thinking an picked up her 35#  grandchild yesterday and has been achy since.  Her pain is intermittent and achy.  Having some issue with ROM.  Previous history of AC.  Pain:  Are you having pain? Yes Pain location: R shoulder pain NPRS scale:  Best: 5/10, Worst: 10/10 Aggravating factors: stretching it out, texting, writing Relieving factors: rest Pain description: aching  Occupation: Licensed conveyancer Device: na  Hand Dominance: R  Patient Goals/Specific Activities: reduce pain, be able to use R arm   OBJECTIVE:   DIAGNOSTIC FINDINGS:  NA  GENERAL OBSERVATION: Rounded shoulders     SENSATION: Light touch: Appears intact   UPPER EXTREMITY AROM:  ROM Right (Eval) Left (Eval)  Shoulder flexion 70* 155  Shoulder abduction 30* significant shoulder hike 150  Shoulder internal rotation    Shoulder external rotation    Functional IR    Functional ER    Shoulder extension    Elbow extension    Elbow flexion     (Blank rows = not tested, N = WNL, * = concordant pain with testing)  UPPER EXTREMITY MMT:  MMT Right (Eval) Left (Eval)  Shoulder flexion    Shoulder abduction (C5)    Shoulder ER    Shoulder IR    Middle trapezius    Lower trapezius    Shoulder extension    Grip strength    Cervical flexion (C1,C2)    Cervical S/B (C3)    Shoulder shrug (C4)    Elbow flexion (C6)    Elbow ext (C7)    Thumb ext (C8)    Finger abd (T1)    Grossly     (Blank rows = not tested, score listed is out of 5 possible points.  N = WNL, D = diminished, C = clear for gross weakness with myotome testing, * = concordant pain with testing)   UPPER EXTREMITY PROM:  PROM Right (Eval) Left (Eval) Right 04/03/24  Shoulder flexion 120  125  Shoulder abduction   100  Shoulder internal rotation 30    Shoulder external rotation 20 neutral  35  Functional IR     Functional ER     Shoulder extension     Elbow extension     Elbow flexion      (Blank rows = not tested, N = WNL, * = concordant pain  with testing)  JOINT MOBILITY TESTING:  Hypomobile GH   PATIENT SURVEYS:  QuickDASH Score: 61.4 / 100 = 61.4 %    TODAY'S TREATMENT:  [facility] Adult PT Treatment:                                                DATE: 04/03/24 AAROM chest press AAROM pullover- non painful arc PROM flexion, abduction , ER, IR Supine horiz add with dowel  Supine ER AAROM with dowel  UE ranger 28# reach x 10  Flexion walk backs at counter OH pulleys  Seated table slides shoulder flexion Seated table slides scaption  Updated HEP    [facility] Adult PT Treatment  03/27/2024:  Therapeutic Exercise:  AAROM chest press AAROM flexion non-painful arc PROM for flexion, scaption, ER Ball roll out on ramp UE ranger - 28''   PATIENT EDUCATION ([place]/HM):  POC, diagnosis, prognosis, HEP, and outcome measures.  Pt educated via explanation, demonstration, and handout (HEP).  Pt confirms understanding verbally.   HOME EXERCISE PROGRAM: Access Code: [email or web address] URL: [email or web address] Date: 03/20/2024 Prepared by: Helene Gasmen  Exercises - Supine Shoulder Press AAROM in Abduction with Dowel  - 2 x daily - 7 x weekly - 2 sets - 10 reps - Supine Shoulder Flexion Extension AAROM with Dowel  - 2 x daily - 7 x weekly - 2 sets - 10 reps - Supine Shoulder External Rotation and Internal Rotation PROM with Caregiver  - 2 x daily - 7 x weekly - 2 sets - 10 reps - Seated Shoulder Flexion Towel Slide at Table Top  - 2 x daily - 7 x weekly - 2 sets - 10 reps Added 04/03/24 - Supine Shoulder External Rotation with Dowel  - 1 x daily - 7 x weekly - 2 sets - 10 reps - Standing Shoulder and Trunk Flexion at Table  - 1 x daily - 7 x weekly - 1 sets - 10 reps - 5 hold  Treatment priorities   Eval        ROM        Passive -> AAROM -> AROM as appropriate                                    ASSESSMENT:  CLINICAL IMPRESSION:  04/03/2024:  Joen tolerated session well with no adverse reaction.  At first, she  was limited to 90 degrees AAROM flexion supine. After PROM and joint mobs, able to reach 125 degrees. Instructed in self PROM using counter walk backs and added to HEP. Also added AAROM ER with dowel. Continued with AAROM for flexion and scaption using table slides, pulleys and supine dowel.    EVAL:  Krisann is a 53 y.o. female who presents to clinic with signs and sxs consistent with R shoulder stiffness and pain following biceps tenodesis, R/C repair, and biceps tenodesis on 8/5.   Fairly stiff with significantly more PROM vs AROM.   Brendolyn will benefit from skilled PT to address relevant deficits and improve shoulder function and comfort.   OBJECTIVE IMPAIRMENTS: Pain, R shoulder ROM, R shoulder strength  ACTIVITY LIMITATIONS: reaching, lifting, childcare, housework, driving, school work, typing  PERSONAL FACTORS: See medical history and pertinent history   REHAB POTENTIAL: Good  CLINICAL DECISION MAKING: Evolving/moderate complexity  EVALUATION COMPLEXITY: Moderate   GOALS:   SHORT TERM GOALS: Target date: 04/17/2024   Elspeth will be >75% HEP compliant to improve carryover between sessions and  facilitate independent management of condition  Evaluation: ongoing Goal status: INITIAL   LONG TERM GOALS: Target date: 05/15/2024   Taliah will self report >/= 50% decrease in pain from evaluation to improve function in daily tasks  Evaluation/Baseline: 10/10 max pain Goal status: INITIAL   2.  Jakki will show a >/= 30 pct improvement in her QUICK DASH score (MCID is 10% or ~5 pts) as a proxy for functional improvement   Evaluation/Baseline: QuickDASH Score: 61.4 / 100 = 61.4 % Goal status: INITIAL   3.  Bailei will be able to care for nephew, including lifting, not limited by pain  Evaluation/Baseline: limited Goal status: INITIAL   4.   Tineshia will demonstrate >130 degrees of active ROM in flexion to allow completion of activities involving reaching OH, not limited by pain  Evaluation/Baseline: 70 degrees Goal status: INITIAL   5.  Aleda will be able to complete school work, not limited by pain  Evaluation/Baseline: limited Goal status: INITIAL   6.  Teryl will improve the following MMTs to >/= 4/5 to show improvement in strength:  flexion and ER   Evaluation/Baseline: not tested Goal status: INITIAL   PLAN: PT FREQUENCY: 1-2x/week  PT DURATION: 8 weeks  PLANNED INTERVENTIONS:  97164- PT Re-evaluation, 97110-Therapeutic exercises, 97530- Therapeutic activity, 97112- Neuromuscular re-education, 97535- Self Care, 02859- Manual therapy, [id number]- Gait training, [id number]- Aquatic Therapy, [phone]- Electrical stimulation (manual), [id number]- Vasopneumatic device, [id number]- Traction (mechanical), [id number]- Ionotophoresis 4mg /ml Dexamethasone , Taping, Dry Needling, Joint manipulation, and Spinal manipulation.  Harlene Persons, PTA 04/03/24 12:40 PM Phone: [phone] Fax: [phone]  I just finished a MCD eval/recert.  Name: JILLIANNA STANEK  MRN: [id number] Please request 2x/week for 8 weeks.  Check all conditions that are expected to impact treatment: Musculoskeletal disorders   I DID put a charge in.  Check all possible CPT codes: 02889- Therapeutic Exercise, [phone]- Neuro Re-education, [phone] - Gait Training, [phone] - Manual Therapy, 97530 - Therapeutic Activities, 97535 - Self Care, [phone] - Re-evaluation, [id number] - Mechanical traction, and [id number] - Aquatic therapy   Thank you!  MCD - Secure

## 2024-04-05 ENCOUNTER — Ambulatory Visit: Payer: MEDICAID | Attending: Orthopedic Surgery | Admitting: Physical Therapy

## 2024-04-05 ENCOUNTER — Encounter: Payer: Self-pay | Admitting: Physical Therapy

## 2024-04-05 DIAGNOSIS — M25511 Pain in right shoulder: Secondary | ICD-10-CM | POA: Diagnosis present

## 2024-04-05 DIAGNOSIS — M6281 Muscle weakness (generalized): Secondary | ICD-10-CM | POA: Diagnosis present

## 2024-04-05 DIAGNOSIS — R600 Localized edema: Secondary | ICD-10-CM | POA: Insufficient documentation

## 2024-04-05 NOTE — Therapy (Signed)
 OUTPATIENT PHYSICAL THERAPY SHOULDER TREATMENT   Patient Name: Maria Blackburn MRN: [id number] DOB:1971/04/16, 53 y.o., female Today's Date: 04/05/2024   PT End of Session - 04/05/24 1317     Visit Number 4    Number of Visits --   1-2x/week   Date for Recertification  05/15/24    Authorization Type [employer]  MCD    Authorization Time Period 03/20/24-06/19/24    Authorization - Visit Number 4    Authorization - Number of Visits 16    PT Start Time 1315    PT Stop Time 1400    PT Time Calculation (min) 45 min           Past Medical History:  Diagnosis Date   Anxiety    Bladder spasms    Frequency of urination    GERD (gastroesophageal reflux disease)    Interstitial cystitis    Pelvic pain    SUI (stress urinary incontinence, female)    Urgency of urination    Past Surgical History:  Procedure Laterality Date   ABDOMINAL HYSTERECTOMY  2000   ovaries remain   BICEPT TENODESIS Right 02/07/2024   Procedure: TENODESIS, BICEPS, RIGHT;  Surgeon: Addie Cordella Hamilton, MD;  Location: [facility] OR;  Service: Orthopedics;  Laterality: Right;   CYSTO WITH HYDRODISTENSION N/A 03/24/2017   Procedure: CYSTOSCOPY/HYDRODISTENSION INSTILLATION OF MARCAINE  AND PYRIDIUM ;  Surgeon: Watt Rush, MD;  Location: [facility];  Service: Urology;  Laterality: N/A;   HYSTEROSCOPY WITH D & C  1999   POLYPECTOMY   POSTERIOR LUMBAR FUSION 2 WITH HARDWARE REMOVAL Right 02/07/2024   Procedure: ARTHROSCOPY, SHOULDER WITH DEBRIDEMENT, RIGHT;  Surgeon: Addie Cordella Hamilton, MD;  Location: [facility] OR;  Service: Orthopedics;  Laterality: Right;  right shoulder arthroscopy, debridement, biceps tenodesis, mini open rotator cuff tear repair   SHOULDER OPEN ROTATOR CUFF REPAIR Right 02/07/2024   Procedure: REPAIR, ROTATOR CUFF, OPEN, RIGHT;  Surgeon: Addie Cordella Hamilton, MD;  Location: [facility] OR;  Service: Orthopedics;  Laterality: Right;   TUBAL LIGATION Bilateral 1996   Patient Active Problem List    Diagnosis Date Noted   Degenerative superior labral anterior-to-posterior (SLAP) tear of right shoulder 02/26/2024   Synovitis of right shoulder 02/26/2024   Biceps tendonitis, right 02/26/2024   Major depressive disorder, recurrent episode, moderate (HCC) 02/21/2024   PTSD (post-traumatic stress disorder) 10/22/2023   MDD (major depressive disorder), recurrent, severe, with psychosis (HCC) 10/21/2023   Acute psychosis (HCC) 09/29/2023   GAD (generalized anxiety disorder) 09/29/2023   Adhesive capsulitis of right shoulder 09/16/2023   Nontraumatic complete tear of right rotator cuff 09/16/2023   De Quervain's tenosynovitis, left 09/10/2021   Lateral epicondylitis, right elbow 09/10/2021   Allergy to environmental factors 10/07/2017    PCP: Lorren Greig PARAS, NP  REFERRING PROVIDER: Addie Cordella Hamilton, MD  THERAPY DIAG:  Right shoulder pain, unspecified chronicity  Muscle weakness  Localized edema  REFERRING DIAG: S/P shoulder surgery [Z98.890]   Rationale for Evaluation and Treatment:  Rehabilitation  SUBJECTIVE:  PERTINENT PAST HISTORY:  MDD, PTSD      PRECAUTIONS:   right shoulder rotator cuff repair biceps tenodesis and debridement for early adhesive capsulitis     OP Date: 02/07/2024  2 weeks 02/21/2024  4 weeks 03/06/2024  6 weeks 03/20/2024  8 weeks 04/03/2024  10 weeks 04/17/2024  12 weeks 05/01/2024   Protocol:  [email or web address]  WEIGHT BEARING RESTRICTIONS Yes    FALLS:  Has patient fallen in last 6 months? No, Number  of falls: 0  MOI/History of condition:  Onset date: 8/5  SUBJECTIVE STATEMENT  04/05/2024:  Pt reports the shoulder was sore but it is getting better. 0/10 pain on arrival.   Eval:  Pt is a 53 y.o. female who presents to clinic with chief complaint of R shoulder pain and stiffness following R/C  repair, debridement, and biceps tenodesis.  She has generally followed precautions but was not thinking an picked up her 35# grandchild yesterday and has been achy since.  Her pain is intermittent and achy.  Having some issue with ROM.  Previous history of AC.  Pain:  Are you having pain? Yes Pain location: R shoulder pain NPRS scale: 0/10  Best: 5/10, Worst: 10/10 Aggravating factors: stretching it out, texting, writing Relieving factors: rest Pain description: aching  Occupation: Licensed conveyancer Device: na  Hand Dominance: R  Patient Goals/Specific Activities: reduce pain, be able to use R arm   OBJECTIVE:   DIAGNOSTIC FINDINGS:  NA  GENERAL OBSERVATION: Rounded shoulders     SENSATION: Light touch: Appears intact   UPPER EXTREMITY AROM:  ROM Right (Eval) Left (Eval)  Shoulder flexion 70* 155  Shoulder abduction 30* significant shoulder hike 150  Shoulder internal rotation    Shoulder external rotation    Functional IR    Functional ER    Shoulder extension    Elbow extension    Elbow flexion     (Blank rows = not tested, N = WNL, * = concordant pain with testing)  UPPER EXTREMITY MMT:  MMT Right (Eval) Left (Eval)  Shoulder flexion    Shoulder abduction (C5)    Shoulder ER    Shoulder IR    Middle trapezius    Lower trapezius    Shoulder extension    Grip strength    Cervical flexion (C1,C2)    Cervical S/B (C3)    Shoulder shrug (C4)    Elbow flexion (C6)    Elbow ext (C7)    Thumb ext (C8)    Finger abd (T1)    Grossly     (Blank rows = not tested, score listed is out of 5 possible points.  N = WNL, D = diminished, C = clear for gross weakness with myotome testing, * = concordant pain with testing)   UPPER EXTREMITY PROM:  PROM Right (Eval) Left (Eval) Right 04/03/24 Right 04/05/24   Shoulder flexion 120  125 135  Shoulder abduction   100   Shoulder internal rotation 30     Shoulder external rotation 20 neutral  35    Functional IR      Functional ER      Shoulder extension      Elbow extension      Elbow flexion       (Blank rows = not tested, N = WNL, * = concordant pain with testing)  JOINT MOBILITY TESTING:  Hypomobile GH   PATIENT SURVEYS:  QuickDASH Score: 61.4 / 100 = 61.4 %    TODAY'S TREATMENT:  [facility] Adult PT Treatment:                                                DATE: 04/05/24 OH pulleys  AAROM pullover- full arcs PROM flexion, abduction , ER, IR Supine horiz add with dowel AAROM Supine ER AAROM with dowel  Supine shoulder flexion long lever x 8 Side lying shoulder abduction x 8 Side lying shoulder ER x 8 Wall slides for flexion Updated HEP   [facility] Adult PT Treatment:                                                DATE: 04/03/24 AAROM chest press AAROM pullover- non painful arc PROM flexion, abduction , ER, IR Supine horiz add with dowel  Supine ER AAROM with dowel  UE ranger 28# reach x 10  Flexion walk backs at counter OH pulleys  Seated table slides shoulder flexion Seated table slides scaption  Updated HEP    [facility] Adult PT Treatment  03/27/2024:  Therapeutic Exercise:  AAROM chest press AAROM flexion non-painful arc PROM for flexion, scaption, ER Ball roll out on ramp UE ranger - 28''   PATIENT EDUCATION ([place]/HM):  POC, diagnosis, prognosis, HEP, and outcome measures.  Pt educated via explanation, demonstration, and handout (HEP).  Pt confirms understanding verbally.   HOME EXERCISE PROGRAM: Access Code: [email or web address] URL: [email or web address] Date: 03/20/2024 Prepared by: Helene Gasmen  Exercises - Supine Shoulder Press AAROM in Abduction with Dowel  - 2 x daily - 7 x weekly - 2 sets - 10 reps - Supine Shoulder Flexion Extension AAROM with Dowel  - 2 x daily - 7 x weekly - 2 sets - 10 reps - Supine Shoulder  External Rotation and Internal Rotation PROM with Caregiver  - 2 x daily - 7 x weekly - 2 sets - 10 reps - Seated Shoulder Flexion Towel Slide at Table Top  - 2 x daily - 7 x weekly - 2 sets - 10 reps Added 04/03/24 - Supine Shoulder External Rotation with Dowel  - 1 x daily - 7 x weekly - 2 sets - 10 reps - Standing Shoulder and Trunk Flexion at Table  - 1 x daily - 7 x weekly - 1 sets - 10 reps - 5 hold Added 04/05/24 - Sidelying Shoulder Abduction Palm Forward  - 1 x daily - 7 x weekly - 1-2 sets - 8-10 reps - Sidelying Shoulder External Rotation  - 1 x daily - 7 x weekly - 1-2 sets - 8-10 reps  Treatment priorities   Eval  10/2      ROM        Passive -> AAROM -> AROM as appropriate   Supine/SL AROM                                ASSESSMENT:  CLINICAL IMPRESSION:  04/05/2024:  Maria Blackburn tolerated session well with no adverse reaction.  Significant improvement in ROM at start of session. PROM to 135 flexion today. Tolerated full arc dowel shoulder flexion. Able to begin long lever AROM in supine and AROM for abduction and ER in sidelying.  Updated HEP.  EVAL:  Maria Blackburn is a 53 y.o. female who presents to clinic with signs and sxs consistent with R shoulder stiffness and pain following biceps tenodesis, R/C repair, and biceps tenodesis on 8/5.   Fairly stiff with significantly more PROM vs AROM.   Maria Blackburn will benefit from skilled PT to address relevant deficits and improve shoulder function and comfort.   OBJECTIVE IMPAIRMENTS: Pain, R shoulder ROM, R shoulder strength  ACTIVITY LIMITATIONS: reaching, lifting, childcare, housework, driving, school work, typing  PERSONAL FACTORS: See medical history and pertinent history   REHAB POTENTIAL: Good  CLINICAL DECISION MAKING: Evolving/moderate complexity  EVALUATION COMPLEXITY: Moderate   GOALS:   SHORT TERM GOALS: Target date: 04/17/2024   Maria Blackburn will be >75% HEP compliant to improve carryover between sessions and facilitate  independent management of condition  Evaluation: ongoing Goal status: INITIAL   LONG TERM GOALS: Target date: 05/15/2024   Maria Blackburn will self report >/= 50% decrease in pain from evaluation to improve function in daily tasks  Evaluation/Baseline: 10/10 max pain Goal status: INITIAL   2.  Maria Blackburn will show a >/= 30 pct improvement in her QUICK DASH score (MCID is 10% or ~5 pts) as a proxy for functional improvement   Evaluation/Baseline: QuickDASH Score: 61.4 / 100 = 61.4 % Goal status: INITIAL   3.  Maria Blackburn will be able to care for nephew, including lifting, not limited by pain  Evaluation/Baseline: limited Goal status: INITIAL   4.  Maria Blackburn will demonstrate >130 degrees of active ROM in flexion to allow completion of activities involving reaching OH, not limited by pain  Evaluation/Baseline: 70 degrees Goal status: INITIAL   5.  Maria Blackburn will be able to complete school work, not limited by pain  Evaluation/Baseline: limited Goal status: INITIAL   6.  Maria Blackburn will improve the following MMTs to >/= 4/5 to show improvement in strength:  flexion and ER   Evaluation/Baseline: not tested Goal status: INITIAL   PLAN: PT FREQUENCY: 1-2x/week  PT DURATION: 8 weeks  PLANNED INTERVENTIONS:  97164- PT Re-evaluation, 97110-Therapeutic exercises, 97530- Therapeutic activity, 97112- Neuromuscular re-education, 97535- Self Care, 02859- Manual therapy, [id number]- Gait training, [id number]- Aquatic Therapy, [id number]- Electrical stimulation (manual), [id number]- Vasopneumatic device, M403810- Traction (mechanical), [id number]- Ionotophoresis 4mg /ml Dexamethasone , Taping, Dry Needling, Joint manipulation, and Spinal manipulation.  Maria Blackburn, PTA 04/05/24 3:59 PM Phone: [phone] Fax: [phone]  I just finished a MCD eval/recert.  Name: DEALIE KOELZER  MRN: [id number] Please request 2x/week for 8 weeks.  Check all conditions that are expected to impact treatment: Musculoskeletal disorders   I DID  put a charge in.  Check all possible CPT codes: 02889- Therapeutic Exercise, [phone]- Neuro Re-education, [phone] - Gait Training, [phone] - Manual Therapy, 97530 - Therapeutic Activities, 97535 - Self Care, [phone] - Re-evaluation, [id number] - Mechanical traction, and [id number] - Aquatic therapy   Thank you!  MCD - Secure

## 2024-04-10 ENCOUNTER — Encounter: Payer: Self-pay | Admitting: Physical Therapy

## 2024-04-10 ENCOUNTER — Ambulatory Visit: Payer: MEDICAID | Admitting: Physical Therapy

## 2024-04-10 DIAGNOSIS — M6281 Muscle weakness (generalized): Secondary | ICD-10-CM

## 2024-04-10 DIAGNOSIS — M25511 Pain in right shoulder: Secondary | ICD-10-CM

## 2024-04-10 DIAGNOSIS — R600 Localized edema: Secondary | ICD-10-CM

## 2024-04-10 NOTE — Therapy (Signed)
 OUTPATIENT PHYSICAL THERAPY SHOULDER TREATMENT   Patient Name: Maria Blackburn MRN: [id number] DOB:[birth year], 53 y.o., female Today's Date: 04/10/2024   PT End of Session - 04/10/24 1307     Visit Number 5    Number of Visits --   1-2x/week   Date for Recertification  05/15/24    Authorization Type [employer]  MCD    Authorization Time Period 03/20/24-06/19/24    Authorization - Visit Number 5    Authorization - Number of Visits 16    PT Start Time 1315    PT Stop Time 1355    PT Time Calculation (min) 40 min           Past Medical History:  Diagnosis Date   Anxiety    Bladder spasms    Frequency of urination    GERD (gastroesophageal reflux disease)    Interstitial cystitis    Pelvic pain    SUI (stress urinary incontinence, female)    Urgency of urination    Past Surgical History:  Procedure Laterality Date   ABDOMINAL HYSTERECTOMY  2000   ovaries remain   BICEPT TENODESIS Right 02/07/2024   Procedure: TENODESIS, BICEPS, RIGHT;  Surgeon: Addie Cordella Hamilton, MD;  Location: [facility] OR;  Service: Orthopedics;  Laterality: Right;   CYSTO WITH HYDRODISTENSION N/A 03/24/2017   Procedure: CYSTOSCOPY/HYDRODISTENSION INSTILLATION OF MARCAINE  AND PYRIDIUM ;  Surgeon: Watt Rush, MD;  Location: [facility];  Service: Urology;  Laterality: N/A;   HYSTEROSCOPY WITH D & C  1999   POLYPECTOMY   POSTERIOR LUMBAR FUSION 2 WITH HARDWARE REMOVAL Right 02/07/2024   Procedure: ARTHROSCOPY, SHOULDER WITH DEBRIDEMENT, RIGHT;  Surgeon: Addie Cordella Hamilton, MD;  Location: [facility] OR;  Service: Orthopedics;  Laterality: Right;  right shoulder arthroscopy, debridement, biceps tenodesis, mini open rotator cuff tear repair   SHOULDER OPEN ROTATOR CUFF REPAIR Right 02/07/2024   Procedure: REPAIR, ROTATOR CUFF, OPEN, RIGHT;  Surgeon: Addie Cordella Hamilton, MD;  Location: [facility] OR;  Service: Orthopedics;  Laterality: Right;   TUBAL LIGATION Bilateral 1996   Patient Active Problem List    Diagnosis Date Noted   Degenerative superior labral anterior-to-posterior (SLAP) tear of right shoulder 02/26/2024   Synovitis of right shoulder 02/26/2024   Biceps tendonitis, right 02/26/2024   Major depressive disorder, recurrent episode, moderate (HCC) 02/21/2024   PTSD (post-traumatic stress disorder) 10/22/2023   MDD (major depressive disorder), recurrent, severe, with psychosis (HCC) 10/21/2023   Acute psychosis (HCC) 09/29/2023   GAD (generalized anxiety disorder) 09/29/2023   Adhesive capsulitis of right shoulder 09/16/2023   Nontraumatic complete tear of right rotator cuff 09/16/2023   De Quervain's tenosynovitis, left 09/10/2021   Lateral epicondylitis, right elbow 09/10/2021   Allergy to environmental factors 10/07/2017    PCP: Lorren Greig PARAS, NP  REFERRING PROVIDER: Addie Cordella Hamilton, MD  THERAPY DIAG:  Right shoulder pain, unspecified chronicity  Muscle weakness  Localized edema  REFERRING DIAG: S/P shoulder surgery [Z98.890]   Rationale for Evaluation and Treatment:  Rehabilitation  SUBJECTIVE:  PERTINENT PAST HISTORY:  MDD, PTSD      PRECAUTIONS:   right shoulder rotator cuff repair biceps tenodesis and debridement for early adhesive capsulitis     OP Date: 02/07/2024  2 weeks 02/21/2024  4 weeks 03/06/2024  6 weeks 03/20/2024  8 weeks 04/03/2024  10 weeks 04/17/2024  12 weeks 05/01/2024   Protocol:  [email or web address]  WEIGHT BEARING RESTRICTIONS Yes    FALLS:  Has patient fallen in last 6 months? No, Number  of falls: 0  MOI/History of condition:  Onset date: 8/5  SUBJECTIVE STATEMENT  04/10/2024:  Pt reports the shoulder is okay, maybe slept wrong. Its better now.   Eval:  Pt is a 53 y.o. female who presents to clinic with chief complaint of R shoulder pain and stiffness following R/C repair,  debridement, and biceps tenodesis.  She has generally followed precautions but was not thinking an picked up her 35# grandchild yesterday and has been achy since.  Her pain is intermittent and achy.  Having some issue with ROM.  Previous history of AC.  Pain:  Are you having pain? Yes Pain location: R shoulder pain NPRS scale: 1-2/10  Best: 5/10, Worst: 10/10 Aggravating factors: stretching it out, texting, writing Relieving factors: rest Pain description: aching  Occupation: Licensed conveyancer Device: na  Hand Dominance: R  Patient Goals/Specific Activities: reduce pain, be able to use R arm   OBJECTIVE:   DIAGNOSTIC FINDINGS:  NA  GENERAL OBSERVATION: Rounded shoulders     SENSATION: Light touch: Appears intact   UPPER EXTREMITY AROM:  ROM Right (Eval) Left (Eval)  Shoulder flexion 70* 155  Shoulder abduction 30* significant shoulder hike 150  Shoulder internal rotation    Shoulder external rotation    Functional IR    Functional ER    Shoulder extension    Elbow extension    Elbow flexion     (Blank rows = not tested, N = WNL, * = concordant pain with testing)  UPPER EXTREMITY MMT:  MMT Right (Eval) Left (Eval)  Shoulder flexion    Shoulder abduction (C5)    Shoulder ER    Shoulder IR    Middle trapezius    Lower trapezius    Shoulder extension    Grip strength    Cervical flexion (C1,C2)    Cervical S/B (C3)    Shoulder shrug (C4)    Elbow flexion (C6)    Elbow ext (C7)    Thumb ext (C8)    Finger abd (T1)    Grossly     (Blank rows = not tested, score listed is out of 5 possible points.  N = WNL, D = diminished, C = clear for gross weakness with myotome testing, * = concordant pain with testing)   UPPER EXTREMITY PROM:  PROM Right (Eval) Left (Eval) Right 04/03/24 Right 04/05/24   Shoulder flexion 120  125 135  Shoulder abduction   100   Shoulder internal rotation 30     Shoulder external rotation 20 neutral  35   Functional IR       Functional ER      Shoulder extension      Elbow extension      Elbow flexion       (Blank rows = not tested, N = WNL, * = concordant pain with testing)  JOINT MOBILITY TESTING:  Hypomobile GH   PATIENT SURVEYS:  QuickDASH Score: 61.4 / 100 = 61.4 %    TODAY'S TREATMENT:  [facility] Adult PT Treatment:                                                DATE: 04/10/24 Therapeutic Exercise/Activity PROM flexion abdct, IR, ER  Supine dowel pullover -for arcs  Supine hor abdct/addct Supine ER AAROM Supine iso ER/IR 5 sec x 5 each with manual resist  Supine shoulder flexion 10 x 2 long lever Side lying shoulder abdct 10 x 2  Side lying Shoulder ER AROM 10 x 2  Seated pulleys  Shoulder row Red band 10 x 2  Wall slides shoulder flexion , scaption x 10 each Ice pack x 10 minutes right shoulder     [facility] Adult PT Treatment:                                                DATE: 04/05/24 OH pulleys  AAROM pullover- full arcs PROM flexion, abduction , ER, IR Supine horiz add with dowel AAROM Supine ER AAROM with dowel  Supine shoulder flexion long lever x 8 Side lying shoulder abduction x 8 Side lying shoulder ER x 8 Wall slides for flexion Updated HEP   [facility] Adult PT Treatment:                                                DATE: 04/03/24 AAROM chest press AAROM pullover- non painful arc PROM flexion, abduction , ER, IR Supine horiz add with dowel  Supine ER AAROM with dowel  UE ranger 28# reach x 10  Flexion walk backs at counter OH pulleys  Seated table slides shoulder flexion Seated table slides scaption  Updated HEP    [facility] Adult PT Treatment  03/27/2024:  Therapeutic Exercise:  AAROM chest press AAROM flexion non-painful arc PROM for flexion, scaption, ER Ball roll out on ramp UE ranger - 28''   PATIENT EDUCATION ([place]/HM):  POC,  diagnosis, prognosis, HEP, and outcome measures.  Pt educated via explanation, demonstration, and handout (HEP).  Pt confirms understanding verbally.   HOME EXERCISE PROGRAM: Access Code: [email or web address] URL: [email or web address] Date: 03/20/2024 Prepared by: Helene Gasmen  Exercises - Supine Shoulder Press AAROM in Abduction with Dowel  - 2 x daily - 7 x weekly - 2 sets - 10 reps - Supine Shoulder Flexion Extension AAROM with Dowel  - 2 x daily - 7 x weekly - 2 sets - 10 reps - Supine Shoulder External Rotation and Internal Rotation PROM with Caregiver  - 2 x daily - 7 x weekly - 2 sets - 10 reps - Seated Shoulder Flexion Towel Slide at Table Top  - 2 x daily - 7 x weekly - 2 sets - 10 reps Added 04/03/24 - Supine Shoulder External Rotation with Dowel  - 1 x daily - 7 x weekly - 2 sets - 10 reps - Standing Shoulder and Trunk Flexion at Table  - 1 x daily - 7 x weekly - 1 sets - 10 reps - 5 hold Added 04/05/24 - Sidelying Shoulder Abduction Palm Forward  - 1 x daily - 7 x weekly - 1-2 sets -  8-10 reps - Sidelying Shoulder External Rotation  - 1 x daily - 7 x weekly - 1-2 sets - 8-10 reps  Treatment priorities   Eval  10/2      ROM        Passive -> AAROM -> AROM as appropriate   Supine/SL AROM                                ASSESSMENT:  CLINICAL IMPRESSION:  04/10/2024:  Joen tolerated session well with no adverse reaction.  Continued AROM progression in supine with increased sets tolerated well . She has good isometric contraction against manual resist for IR/ER.     EVAL:  Samanthan is a 53 y.o. female who presents to clinic with signs and sxs consistent with R shoulder stiffness and pain following biceps tenodesis, R/C repair, and biceps tenodesis on 8/5.   Fairly stiff with significantly more PROM vs AROM.   Lezly will benefit from skilled PT to address relevant deficits and improve shoulder function and comfort.   OBJECTIVE IMPAIRMENTS: Pain, R shoulder ROM, R  shoulder strength  ACTIVITY LIMITATIONS: reaching, lifting, childcare, housework, driving, school work, typing  PERSONAL FACTORS: See medical history and pertinent history   REHAB POTENTIAL: Good  CLINICAL DECISION MAKING: Evolving/moderate complexity  EVALUATION COMPLEXITY: Moderate   GOALS:   SHORT TERM GOALS: Target date: 04/17/2024   Aubrea will be >75% HEP compliant to improve carryover between sessions and facilitate independent management of condition  Evaluation: ongoing Goal status: INITIAL   LONG TERM GOALS: Target date: 05/15/2024   Lourene will self report >/= 50% decrease in pain from evaluation to improve function in daily tasks  Evaluation/Baseline: 10/10 max pain Goal status: INITIAL   2.  Niyati will show a >/= 30 pct improvement in her QUICK DASH score (MCID is 10% or ~5 pts) as a proxy for functional improvement   Evaluation/Baseline: QuickDASH Score: 61.4 / 100 = 61.4 % Goal status: INITIAL   3.  Iza will be able to care for nephew, including lifting, not limited by pain  Evaluation/Baseline: limited Goal status: INITIAL   4.  Dinita will demonstrate >130 degrees of active ROM in flexion to allow completion of activities involving reaching OH, not limited by pain  Evaluation/Baseline: 70 degrees Goal status: INITIAL   5.  Yitzel will be able to complete school work, not limited by pain  Evaluation/Baseline: limited Goal status: INITIAL   6.  Lashunda will improve the following MMTs to >/= 4/5 to show improvement in strength:  flexion and ER   Evaluation/Baseline: not tested Goal status: INITIAL   PLAN: PT FREQUENCY: 1-2x/week  PT DURATION: 8 weeks  PLANNED INTERVENTIONS:  97164- PT Re-evaluation, 97110-Therapeutic exercises, 97530- Therapeutic activity, 97112- Neuromuscular re-education, 97535- Self Care, 02859- Manual therapy, [id number]- Gait training, [id number]- Aquatic Therapy, [phone]- Electrical stimulation (manual), [id number]-  Vasopneumatic device, [id number]- Traction (mechanical), [id number]- Ionotophoresis 4mg /ml Dexamethasone , Taping, Dry Needling, Joint manipulation, and Spinal manipulation.  Harlene Persons, PTA 04/10/24 3:32 PM Phone: [phone] Fax: [phone]   I just finished a MCD eval/recert.  Name: EDWINNA ROCHETTE  MRN: [id number] Please request 2x/week for 8 weeks.  Check all conditions that are expected to impact treatment: Musculoskeletal disorders   I DID put a charge in.  Check all possible CPT codes: 02889- Therapeutic Exercise, [phone]- Neuro Re-education, [phone] - Gait Training, [phone] - Manual Therapy, 97530 - Therapeutic Activities, [phone] - Self Care, [phone] -  Re-evaluation, [id number] - Mechanical traction, and [id number] - Aquatic therapy   Thank you!  MCD - Secure

## 2024-04-12 ENCOUNTER — Ambulatory Visit: Payer: MEDICAID | Admitting: Physical Therapy

## 2024-04-12 ENCOUNTER — Encounter: Payer: Self-pay | Admitting: Physical Therapy

## 2024-04-12 DIAGNOSIS — R600 Localized edema: Secondary | ICD-10-CM

## 2024-04-12 DIAGNOSIS — M6281 Muscle weakness (generalized): Secondary | ICD-10-CM

## 2024-04-12 DIAGNOSIS — M25511 Pain in right shoulder: Secondary | ICD-10-CM

## 2024-04-12 NOTE — Therapy (Signed)
 OUTPATIENT PHYSICAL THERAPY SHOULDER TREATMENT   Patient Name: Maria Blackburn MRN: [id number] DOB:04-Jul-1971, 53 y.o., female Today's Date: 04/12/2024   PT End of Session - 04/12/24 1413     Visit Number 6    Number of Visits --   1-2x/week   Date for Recertification  05/15/24    Authorization Type [employer]  MCD    Authorization Time Period 03/20/24-06/19/24    Authorization - Visit Number 6    Authorization - Number of Visits 16    PT Start Time 1415    PT Stop Time 1456    PT Time Calculation (min) 41 min           Past Medical History:  Diagnosis Date   Anxiety    Bladder spasms    Frequency of urination    GERD (gastroesophageal reflux disease)    Interstitial cystitis    Pelvic pain    SUI (stress urinary incontinence, female)    Urgency of urination    Past Surgical History:  Procedure Laterality Date   ABDOMINAL HYSTERECTOMY  2000   ovaries remain   BICEPT TENODESIS Right 02/07/2024   Procedure: TENODESIS, BICEPS, RIGHT;  Surgeon: Addie Cordella Hamilton, MD;  Location: [facility] OR;  Service: Orthopedics;  Laterality: Right;   CYSTO WITH HYDRODISTENSION N/A 03/24/2017   Procedure: CYSTOSCOPY/HYDRODISTENSION INSTILLATION OF MARCAINE  AND PYRIDIUM ;  Surgeon: Watt Rush, MD;  Location: [facility];  Service: Urology;  Laterality: N/A;   HYSTEROSCOPY WITH D & C  1999   POLYPECTOMY   POSTERIOR LUMBAR FUSION 2 WITH HARDWARE REMOVAL Right 02/07/2024   Procedure: ARTHROSCOPY, SHOULDER WITH DEBRIDEMENT, RIGHT;  Surgeon: Addie Cordella Hamilton, MD;  Location: [facility] OR;  Service: Orthopedics;  Laterality: Right;  right shoulder arthroscopy, debridement, biceps tenodesis, mini open rotator cuff tear repair   SHOULDER OPEN ROTATOR CUFF REPAIR Right 02/07/2024   Procedure: REPAIR, ROTATOR CUFF, OPEN, RIGHT;  Surgeon: Addie Cordella Hamilton, MD;  Location: [facility] OR;  Service: Orthopedics;  Laterality: Right;   TUBAL LIGATION Bilateral 1996   Patient Active Problem List    Diagnosis Date Noted   Degenerative superior labral anterior-to-posterior (SLAP) tear of right shoulder 02/26/2024   Synovitis of right shoulder 02/26/2024   Biceps tendonitis, right 02/26/2024   Major depressive disorder, recurrent episode, moderate (HCC) 02/21/2024   PTSD (post-traumatic stress disorder) 10/22/2023   MDD (major depressive disorder), recurrent, severe, with psychosis (HCC) 10/21/2023   Acute psychosis (HCC) 09/29/2023   GAD (generalized anxiety disorder) 09/29/2023   Adhesive capsulitis of right shoulder 09/16/2023   Nontraumatic complete tear of right rotator cuff 09/16/2023   De Quervain's tenosynovitis, left 09/10/2021   Lateral epicondylitis, right elbow 09/10/2021   Allergy to environmental factors 10/07/2017    PCP: Lorren Greig PARAS, NP  REFERRING PROVIDER: Addie Cordella Hamilton, MD  THERAPY DIAG:  Right shoulder pain, unspecified chronicity  Muscle weakness  Localized edema  REFERRING DIAG: S/P shoulder surgery [Z98.890]   Rationale for Evaluation and Treatment:  Rehabilitation  SUBJECTIVE:  PERTINENT PAST HISTORY:  MDD, PTSD      PRECAUTIONS:   right shoulder rotator cuff repair biceps tenodesis and debridement for early adhesive capsulitis     OP Date: 02/07/2024  2 weeks 02/21/2024  4 weeks 03/06/2024  6 weeks 03/20/2024  8 weeks 04/03/2024  10 weeks 04/17/2024  12 weeks 05/01/2024   Protocol:  [email or web address]  WEIGHT BEARING RESTRICTIONS Yes    FALLS:  Has patient fallen in last 6 months? No, Number  of falls: 0  MOI/History of condition:  Onset date: 8/5  SUBJECTIVE STATEMENT  04/12/2024:  Pt reports that her pain is improving.  She rates her current pain 5/10.   Eval:  Pt is a 53 y.o. female who presents to clinic with chief complaint of R shoulder pain and stiffness following R/C repair,  debridement, and biceps tenodesis.  She has generally followed precautions but was not thinking an picked up her 35# grandchild yesterday and has been achy since.  Her pain is intermittent and achy.  Having some issue with ROM.  Previous history of AC.  Pain:  Are you having pain? Yes Pain location: R shoulder pain NPRS scale: 1-2/10  Best: 0/10, Worst: 5/10 Aggravating factors: stretching it out, texting, writing Relieving factors: rest Pain description: aching  Occupation: Licensed conveyancer Device: na  Hand Dominance: R  Patient Goals/Specific Activities: reduce pain, be able to use R arm   OBJECTIVE:   DIAGNOSTIC FINDINGS:  NA  GENERAL OBSERVATION: Rounded shoulders     SENSATION: Light touch: Appears intact   UPPER EXTREMITY AROM:  ROM Right (Eval) Left (Eval)  Shoulder flexion 70* 155  Shoulder abduction 30* significant shoulder hike 150  Shoulder internal rotation    Shoulder external rotation    Functional IR    Functional ER    Shoulder extension    Elbow extension    Elbow flexion     (Blank rows = not tested, N = WNL, * = concordant pain with testing)  UPPER EXTREMITY MMT:  MMT Right (Eval) Left (Eval)  Shoulder flexion    Shoulder abduction (C5)    Shoulder ER    Shoulder IR    Middle trapezius    Lower trapezius    Shoulder extension    Grip strength    Cervical flexion (C1,C2)    Cervical S/B (C3)    Shoulder shrug (C4)    Elbow flexion (C6)    Elbow ext (C7)    Thumb ext (C8)    Finger abd (T1)    Grossly     (Blank rows = not tested, score listed is out of 5 possible points.  N = WNL, D = diminished, C = clear for gross weakness with myotome testing, * = concordant pain with testing)   UPPER EXTREMITY PROM:  PROM Right (Eval) Left (Eval) Right 04/03/24 Right 04/05/24  R 10/9  Shoulder flexion 120  125 135 140 Supine  Shoulder abduction   100    Shoulder internal rotation 30      Shoulder external rotation 20 neutral   35    Functional IR       Functional ER       Shoulder extension       Elbow extension       Elbow flexion        (Blank rows = not tested, N = WNL, * = concordant pain with testing)  JOINT MOBILITY TESTING:  Hypomobile GH   PATIENT SURVEYS:  QuickDASH Score: 61.4 / 100 = 61.4 %    TODAY'S TREATMENT:  [facility] Adult PT Treatment:                                                DATE: 04/12/24 Therapeutic Exercise/Activity PROM flexion abdct, IR, ER  Supine dowel pullover -for arcs  Supine ER AAROM Supine punch - YTB Supine shoulder flexion 10 x 2 long lever With light manual resistance to 90 Shoulder row Red band 10 x 3 Shoulder ext RTB - 3x10 Wall slides shoulder flexion , scaption x 10 each 90-90 OH press to 90 degrees    [facility] Adult PT Treatment:                                                DATE: 04/05/24 OH pulleys  AAROM pullover- full arcs PROM flexion, abduction , ER, IR Supine horiz add with dowel AAROM Supine ER AAROM with dowel  Supine shoulder flexion long lever x 8 Side lying shoulder abduction x 8 Side lying shoulder ER x 8 Wall slides for flexion Updated HEP   [facility] Adult PT Treatment:                                                DATE: 04/03/24 AAROM chest press AAROM pullover- non painful arc PROM flexion, abduction , ER, IR Supine horiz add with dowel  Supine ER AAROM with dowel  UE ranger 28# reach x 10  Flexion walk backs at counter OH pulleys  Seated table slides shoulder flexion Seated table slides scaption  Updated HEP    [facility] Adult PT Treatment  03/27/2024:  Therapeutic Exercise:  AAROM chest press AAROM flexion non-painful arc PROM for flexion, scaption, ER Ball roll out on ramp UE ranger - 28''   PATIENT EDUCATION ([place]/[facility]):  POC, diagnosis, prognosis, HEP, and outcome measures.  Pt educated via explanation,  demonstration, and handout (HEP).  Pt confirms understanding verbally.   HOME EXERCISE PROGRAM: Access Code: [email or web address] URL: [email or web address] Date: 04/12/2024 Prepared by: Helene Gasmen  Exercises - Supine Shoulder Flexion Extension AAROM with Dowel  - 2 x daily - 7 x weekly - 2 sets - 10 reps - Supine Shoulder External Rotation with Dowel  - 1 x daily - 7 x weekly - 2 sets - 10 reps - Supine Shoulder Flexion Extension Full Range AROM  - 1 x daily - 7 x weekly - 1-2 sets - 8-10 reps - Sidelying Shoulder Abduction Palm Forward  - 1 x daily - 7 x weekly - 1-2 sets - 8-10 reps - Sidelying Shoulder External Rotation  - 1 x daily - 7 x weekly - 1-2 sets - 8-10 reps - Seated Single Arm Overhead Elbow Extension  - 1 x daily - 7 x weekly - 3 sets - 10 reps - Seated Overhead Press  - 1 x daily - 7 x weekly - 3 sets - 10 reps  Treatment priorities   Eval  10/2      ROM        Passive -> AAROM -> AROM as appropriate   Supine/SL AROM  ASSESSMENT:  CLINICAL IMPRESSION:  04/12/2024:  Joen tolerated session well with no adverse reaction. Tolerating AROM well with OH press to 90 completed with minimal compensation.  Updated HEP.    EVAL:  Dyasia is a 52 y.o. female who presents to clinic with signs and sxs consistent with R shoulder stiffness and pain following biceps tenodesis, R/C repair, and biceps tenodesis on 8/5.   Fairly stiff with significantly more PROM vs AROM.   Henchy will benefit from skilled PT to address relevant deficits and improve shoulder function and comfort.   OBJECTIVE IMPAIRMENTS: Pain, R shoulder ROM, R shoulder strength  ACTIVITY LIMITATIONS: reaching, lifting, childcare, housework, driving, school work, typing  PERSONAL FACTORS: See medical history and pertinent history   REHAB POTENTIAL: Good  CLINICAL DECISION MAKING: Evolving/moderate complexity  EVALUATION COMPLEXITY: Moderate   GOALS:   SHORT TERM  GOALS: Target date: 04/17/2024   Tyreonna will be >75% HEP compliant to improve carryover between sessions and facilitate independent management of condition  Evaluation: ongoing Goal status: MET   LONG TERM GOALS: Target date: 05/15/2024   Shanna will self report >/= 50% decrease in pain from evaluation to improve function in daily tasks  Evaluation/Baseline: 10/10 max pain Goal status: INITIAL   2.  Jalysa will show a >/= 30 pct improvement in her QUICK DASH score (MCID is 10% or ~5 pts) as a proxy for functional improvement   Evaluation/Baseline: QuickDASH Score: 61.4 / 100 = 61.4 % Goal status: INITIAL   3.  Kristyna will be able to care for nephew, including lifting, not limited by pain  Evaluation/Baseline: limited Goal status: INITIAL   4.  Larisa will demonstrate >130 degrees of active ROM in flexion to allow completion of activities involving reaching OH, not limited by pain  Evaluation/Baseline: 70 degrees Goal status: INITIAL   5.  Thelda will be able to complete school work, not limited by pain  Evaluation/Baseline: limited Goal status: INITIAL   6.  Suhayla will improve the following MMTs to >/= 4/5 to show improvement in strength:  flexion and ER   Evaluation/Baseline: not tested Goal status: INITIAL   PLAN: PT FREQUENCY: 1-2x/week  PT DURATION: 8 weeks  PLANNED INTERVENTIONS:  97164- PT Re-evaluation, 97110-Therapeutic exercises, 97530- Therapeutic activity, 97112- Neuromuscular re-education, 97535- Self Care, 02859- Manual therapy, [id number]- Gait training, [id number]- Aquatic Therapy, [phone]- Electrical stimulation (manual), [id number]- Vasopneumatic device, [id number]- Traction (mechanical), [id number]- Ionotophoresis 4mg /ml Dexamethasone , Taping, Dry Needling, Joint manipulation, and Spinal manipulation.  Helene BRAVO Alisan Dokes PT 04/12/24 2:14 PM Phone: [phone] Fax: [phone]   I just finished a MCD eval/recert.  Name: KATHYA WILZ  MRN: [id number] Please  request 2x/week for 8 weeks.  Check all conditions that are expected to impact treatment: Musculoskeletal disorders   I DID put a charge in.  Check all possible CPT codes: 02889- Therapeutic Exercise, [phone]- Neuro Re-education, [phone] - Gait Training, [phone] - Manual Therapy, 97530 - Therapeutic Activities, 97535 - Self Care, [phone] - Re-evaluation, [id number] - Mechanical traction, and [id number] - Aquatic therapy   Thank you!  MCD - Secure

## 2024-04-16 ENCOUNTER — Ambulatory Visit: Payer: MEDICAID | Admitting: Physical Therapy

## 2024-04-16 ENCOUNTER — Encounter: Payer: Self-pay | Admitting: Physical Therapy

## 2024-04-16 DIAGNOSIS — M25511 Pain in right shoulder: Secondary | ICD-10-CM | POA: Diagnosis not present

## 2024-04-16 DIAGNOSIS — R600 Localized edema: Secondary | ICD-10-CM

## 2024-04-16 DIAGNOSIS — M6281 Muscle weakness (generalized): Secondary | ICD-10-CM

## 2024-04-16 NOTE — Therapy (Signed)
 OUTPATIENT PHYSICAL THERAPY SHOULDER TREATMENT   Patient Name: Maria Blackburn MRN: [id number] DOB:[birth year], 53 y.o., female Today's Date: 04/16/2024   Maria Blackburn End of Session - 04/16/24 1312     Visit Number 7    Number of Visits --   1-2x/week   Date for Recertification  05/15/24    Authorization Type [employer]  MCD    Authorization Time Period 03/20/24-06/19/24    Authorization - Visit Number 7    Authorization - Number of Visits 16    Maria Blackburn Start Time 1315    Maria Blackburn Stop Time 1357    Maria Blackburn Time Calculation (min) 42 min           Past Medical History:  Diagnosis Date   Anxiety    Bladder spasms    Frequency of urination    GERD (gastroesophageal reflux disease)    Interstitial cystitis    Pelvic pain    SUI (stress urinary incontinence, female)    Urgency of urination    Past Surgical History:  Procedure Laterality Date   ABDOMINAL HYSTERECTOMY  2000   ovaries remain   BICEPT TENODESIS Right 02/07/2024   Procedure: TENODESIS, BICEPS, RIGHT;  Surgeon: Addie Cordella Hamilton, MD;  Location: [facility] OR;  Service: Orthopedics;  Laterality: Right;   CYSTO WITH HYDRODISTENSION N/A 03/24/2017   Procedure: CYSTOSCOPY/HYDRODISTENSION INSTILLATION OF MARCAINE  AND PYRIDIUM ;  Surgeon: Watt Rush, MD;  Location: [facility];  Service: Urology;  Laterality: N/A;   HYSTEROSCOPY WITH D & C  1999   POLYPECTOMY   POSTERIOR LUMBAR FUSION 2 WITH HARDWARE REMOVAL Right 02/07/2024   Procedure: ARTHROSCOPY, SHOULDER WITH DEBRIDEMENT, RIGHT;  Surgeon: Addie Cordella Hamilton, MD;  Location: [facility] OR;  Service: Orthopedics;  Laterality: Right;  right shoulder arthroscopy, debridement, biceps tenodesis, mini open rotator cuff tear repair   SHOULDER OPEN ROTATOR CUFF REPAIR Right 02/07/2024   Procedure: REPAIR, ROTATOR CUFF, OPEN, RIGHT;  Surgeon: Addie Cordella Hamilton, MD;  Location: [facility] OR;  Service: Orthopedics;  Laterality: Right;   TUBAL LIGATION Bilateral 1996   Patient Active Problem List    Diagnosis Date Noted   Degenerative superior labral anterior-to-posterior (SLAP) tear of right shoulder 02/26/2024   Synovitis of right shoulder 02/26/2024   Biceps tendonitis, right 02/26/2024   Major depressive disorder, recurrent episode, moderate (HCC) 02/21/2024   PTSD (post-traumatic stress disorder) 10/22/2023   MDD (major depressive disorder), recurrent, severe, with psychosis (HCC) 10/21/2023   Acute psychosis (HCC) 09/29/2023   GAD (generalized anxiety disorder) 09/29/2023   Adhesive capsulitis of right shoulder 09/16/2023   Nontraumatic complete tear of right rotator cuff 09/16/2023   De Quervain's tenosynovitis, left 09/10/2021   Lateral epicondylitis, right elbow 09/10/2021   Allergy to environmental factors 10/07/2017    PCP: Lorren Greig PARAS, NP  REFERRING PROVIDER: Addie Cordella Hamilton, MD  THERAPY DIAG:  Right shoulder pain, unspecified chronicity  Muscle weakness  Localized edema  REFERRING DIAG: S/P shoulder surgery [Z98.890]   Rationale for Evaluation and Treatment:  Rehabilitation  SUBJECTIVE:  PERTINENT PAST HISTORY:  MDD, PTSD      PRECAUTIONS:   right shoulder rotator cuff repair biceps tenodesis and debridement for early adhesive capsulitis     OP Date: 02/07/2024  2 weeks 02/21/2024  4 weeks 03/06/2024  6 weeks 03/20/2024  8 weeks 04/03/2024  10 weeks 04/17/2024  12 weeks 05/01/2024   Protocol:  [email or web address]  WEIGHT BEARING RESTRICTIONS Yes    FALLS:  Has patient fallen in last 6 months? No, Number  of falls: 0  MOI/History of condition:  Onset date: 8/5  SUBJECTIVE STATEMENT  04/16/2024:  Maria Blackburn reports that her pain is improving.  She rates her current pain 0/10.   Eval:  Maria Blackburn is a 53 y.o. female who presents to clinic with chief complaint of R shoulder pain and stiffness following R/C repair,  debridement, and biceps tenodesis.  She has generally followed precautions but was not thinking an picked up her 35# grandchild yesterday and has been achy since.  Her pain is intermittent and achy.  Having some issue with ROM.  Previous history of AC.  Pain:  Are you having pain? Yes Pain location: R shoulder pain NPRS scale: 0/10  Best: 0/10, Worst: 5/10 Aggravating factors: stretching it out, texting, writing Relieving factors: rest Pain description: aching  Occupation: Licensed conveyancer Device: na  Hand Dominance: R  Patient Goals/Specific Activities: reduce pain, be able to use R arm   OBJECTIVE:   DIAGNOSTIC FINDINGS:  NA  GENERAL OBSERVATION: Rounded shoulders     SENSATION: Light touch: Appears intact   UPPER EXTREMITY AROM:  ROM Right (Eval) Left (Eval)  Shoulder flexion 70* 155  Shoulder abduction 30* significant shoulder hike 150  Shoulder internal rotation    Shoulder external rotation    Functional IR    Functional ER    Shoulder extension    Elbow extension    Elbow flexion     (Blank rows = not tested, N = WNL, * = concordant pain with testing)  UPPER EXTREMITY MMT:  MMT Right (Eval) Left (Eval)  Shoulder flexion    Shoulder abduction (C5)    Shoulder ER    Shoulder IR    Middle trapezius    Lower trapezius    Shoulder extension    Grip strength    Cervical flexion (C1,C2)    Cervical S/B (C3)    Shoulder shrug (C4)    Elbow flexion (C6)    Elbow ext (C7)    Thumb ext (C8)    Finger abd (T1)    Grossly     (Blank rows = not tested, score listed is out of 5 possible points.  N = WNL, D = diminished, C = clear for gross weakness with myotome testing, * = concordant pain with testing)   UPPER EXTREMITY PROM:  PROM Right (Eval) Left (Eval) Right 04/03/24 Right 04/05/24  R 10/9  Shoulder flexion 120  125 135 140 Supine  Shoulder abduction   100    Shoulder internal rotation 30      Shoulder external rotation 20 neutral  35     Functional IR       Functional ER       Shoulder extension       Elbow extension       Elbow flexion        (Blank rows = not tested, N = WNL, * = concordant pain with testing)  JOINT MOBILITY TESTING:  Hypomobile GH   PATIENT SURVEYS:  QuickDASH Score: 61.4 / 100 = 61.4 %    TODAY'S TREATMENT:  [facility] Adult Maria Blackburn Treatment:                                                DATE: 04/16/24 Therapeutic activity/exercise/manual Supine dowel pullovers  Supine long lever shoulder flexion 15 x 2  Side lying shoulder abduction x 10 Side lying shoulder ER AROM  OH pulleys Seated OH press x 8  Seated tricep ext per HEP x 10 ROW GTB  10 x 2  Pull downs GTB 10 x 2  Standing shoulder YTB IR right x 1 5  Standing shoulder YTB ER right x 8  Standing cane (UE ranger) shoulder flexion , scaption x 10 each  Joint mobs A/P and inferior glides  Supine shoulder PROM flexion, Abduction, ER, IR    [facility] Adult Maria Blackburn Treatment:                                                DATE: 04/12/24 Therapeutic Exercise/Activity PROM flexion abdct, IR, ER  Supine dowel pullover -for arcs  Supine ER AAROM Supine punch - YTB Supine shoulder flexion 10 x 2 long lever With light manual resistance to 90 Shoulder row Red band 10 x 3 Shoulder ext RTB - 3x10 Wall slides shoulder flexion , scaption x 10 each 90-90 OH press to 90 degrees    [facility] Adult Maria Blackburn Treatment:                                                DATE: 04/05/24 OH pulleys  AAROM pullover- full arcs PROM flexion, abduction , ER, IR Supine horiz add with dowel AAROM Supine ER AAROM with dowel  Supine shoulder flexion long lever x 8 Side lying shoulder abduction x 8 Side lying shoulder ER x 8 Wall slides for flexion Updated HEP   [facility] Adult Maria Blackburn Treatment:                                                DATE: 04/03/24 AAROM  chest press AAROM pullover- non painful arc PROM flexion, abduction , ER, IR Supine horiz add with dowel  Supine ER AAROM with dowel  UE ranger 28# reach x 10  Flexion walk backs at counter OH pulleys  Seated table slides shoulder flexion Seated table slides scaption  Updated HEP    [facility] Adult Maria Blackburn Treatment  03/27/2024:  Therapeutic Exercise:  AAROM chest press AAROM flexion non-painful arc PROM for flexion, scaption, ER Ball roll out on ramp UE ranger - 28''   PATIENT EDUCATION ([place]/HM):  POC, diagnosis, prognosis, HEP, and outcome measures.  Maria Blackburn educated via explanation, demonstration, and handout (HEP).  Maria Blackburn confirms understanding verbally.   HOME EXERCISE PROGRAM: Access Code: [email or web address] URL: [email or web address] Date: 04/12/2024 Prepared by: Helene Gasmen  Exercises - Supine Shoulder Flexion Extension AAROM with Dowel  - 2 x daily - 7 x weekly - 2 sets - 10 reps - Supine Shoulder External Rotation with Dowel  - 1 x daily -  7 x weekly - 2 sets - 10 reps - Supine Shoulder Flexion Extension Full Range AROM  - 1 x daily - 7 x weekly - 1-2 sets - 8-10 reps - Sidelying Shoulder Abduction Palm Forward  - 1 x daily - 7 x weekly - 1-2 sets - 8-10 reps - Sidelying Shoulder External Rotation  - 1 x daily - 7 x weekly - 1-2 sets - 8-10 reps - Seated Single Arm Overhead Elbow Extension  - 1 x daily - 7 x weekly - 3 sets - 10 reps - Seated Overhead Press  - 1 x daily - 7 x weekly - 3 sets - 10 reps  Treatment priorities   Eval  10/2      ROM        Passive -> AAROM -> AROM as appropriate   Supine/SL AROM                                ASSESSMENT:  CLINICAL IMPRESSION:  04/16/2024:  Maria Blackburn tolerated session well with no adverse reaction. Tolerating progression to light resistance cuff strengthening. Will plan to update HEP next visit. Manual performed to decrease tension, she reported feeling much looser end of session.     EVAL:  Maria Blackburn is a 53 y.o.  female who presents to clinic with signs and sxs consistent with R shoulder stiffness and pain following biceps tenodesis, R/C repair, and biceps tenodesis on 8/5.   Fairly stiff with significantly more PROM vs AROM.   Maria Blackburn will benefit from skilled Maria Blackburn to address relevant deficits and improve shoulder function and comfort.   OBJECTIVE IMPAIRMENTS: Pain, R shoulder ROM, R shoulder strength  ACTIVITY LIMITATIONS: reaching, lifting, childcare, housework, driving, school work, typing  PERSONAL FACTORS: See medical history and pertinent history   REHAB POTENTIAL: Good  CLINICAL DECISION MAKING: Evolving/moderate complexity  EVALUATION COMPLEXITY: Moderate   GOALS:   SHORT TERM GOALS: Target date: 04/17/2024   Maria Blackburn will be >75% HEP compliant to improve carryover between sessions and facilitate independent management of condition  Evaluation: ongoing Goal status: MET   LONG TERM GOALS: Target date: 05/15/2024   Maria Blackburn will self report >/= 50% decrease in pain from evaluation to improve function in daily tasks  Evaluation/Baseline: 10/10 max pain Goal status: INITIAL   2.  Maria Blackburn will show a >/= 30 pct improvement in her QUICK DASH score (MCID is 10% or ~5 pts) as a proxy for functional improvement   Evaluation/Baseline: QuickDASH Score: 61.4 / 100 = 61.4 % Goal status: INITIAL   3.  Maria Blackburn will be able to care for nephew, including lifting, not limited by pain  Evaluation/Baseline: limited Goal status: INITIAL   4.  Maria Blackburn will demonstrate >130 degrees of active ROM in flexion to allow completion of activities involving reaching OH, not limited by pain  Evaluation/Baseline: 70 degrees Goal status: INITIAL   5.  Maria Blackburn will be able to complete school work, not limited by pain  Evaluation/Baseline: limited Goal status: INITIAL   6.  Maria Blackburn will improve the following MMTs to >/= 4/5 to show improvement in strength:  flexion and ER   Evaluation/Baseline: not  tested Goal status: INITIAL   PLAN: Maria Blackburn FREQUENCY: 1-2x/week  Maria Blackburn DURATION: 8 weeks  PLANNED INTERVENTIONS:  97164- Maria Blackburn Re-evaluation, 97110-Therapeutic exercises, 97530- Therapeutic activity, [id number]- Neuromuscular re-education, 97535- Self Care, 02859- Manual therapy, [id number]- Gait training, [id number]- Aquatic Therapy, [id number]- Electrical stimulation (manual), [id number]- Vasopneumatic device,  02987- Traction (mechanical), 02966- Ionotophoresis 4mg /ml Dexamethasone , Taping, Dry Needling, Joint manipulation, and Spinal manipulation.  Maria Blackburn PTA 04/16/24 2:02 PM Phone: [phone] Fax: [phone]   I just finished a MCD eval/recert.  Name: Maria Blackburn  MRN: [id number] Please request 2x/week for 8 weeks.  Check all conditions that are expected to impact treatment: Musculoskeletal disorders   I DID put a charge in.  Check all possible CPT codes: 02889- Therapeutic Exercise, [phone]- Neuro Re-education, [phone] - Gait Training, [phone] - Manual Therapy, 97530 - Therapeutic Activities, 97535 - Self Care, [phone] - Re-evaluation, [id number] - Mechanical traction, and [id number] - Aquatic therapy   Thank you!  MCD - Secure

## 2024-04-17 NOTE — Progress Notes (Signed)
 Cardiology Office Note:   Date:  04/20/2024  ID:  Maria Blackburn, DOB 20-Apr-1971, MRN [id number] PCP:  Lorren Greig PARAS, NP  [facility] Providers Cardiologist:  [name], MD Referring MD: Lorren Greig PARAS, NP  Chief Complaint/Reason for Referral: QT prolongation ASSESSMENT:    1. Prolonged QT interval   2. Precordial pain   3. Pre-procedure lab exam   4. Medication management   5. Chest pain, unspecified type     PLAN:   In order of problems listed above: Prolonged QT interval.  The patient's EKG today demonstrates normal QT interval.  The patient is on multiple medications that can prolong QT.  These include fluoxetine  that is associated with moderate risk for QT prolongation.  Sertraline  which has a low to moderate risk, and Abilify  which has a low risk.  Solifenacin  also has been associated with QT prolongation.  Her other medications are not associated with QT prolongation.  Would recommend stopping these medications and assessing QT afterwards.  Given normalization of QT could consider serial monitoring.  If QT prolongs again would consider adjusting the dose of these medications. Chest pain:  We will obtain a coronary CTA and echocardiogram to evaluate further.  If the patient has mild obstructive coronary artery disease, they will require a statin (with goal LDL < 70) and aspirin, if they have high-grade disease we will need to consider optimal medical therapy and if symptoms are refractory to medical therapy, then a cardiac catheterization with possible PCI will be pursued to alleviate symptoms.  If they have high risk disease we will proceed directly to cardiac catheterization.              Dispo:  Return if symptoms worsen or fail to improve.       I spent 42 minutes reviewing all clinical data during and prior to this visit including all relevant imaging studies, laboratories, clinical information from other health systems and prior notes from both Cardiology and  other specialties, interviewing the patient, conducting a complete physical examination, and coordinating care in order to formulate a comprehensive and personalized evaluation and treatment plan.   History of Present Illness:    FOCUSED PROBLEM LIST:   Generalized anxiety disorder Major depressive disorder with psychosis PTSD  October 2025:  Patient consents to use of AI scribe. The patient is 53 year old female with above listed medical problems referred for recommendations regarding QT prolongation seen on her EKG.  Patient had an EKG done on October 21, 2023 which demonstrated prolonged QT with a QTc of 545 ms.  She was referred to cardiology for this issue.  An EKG done on October 23, 2023 demonstrated normalization of QT interval with a QTc of 444 ms.  Approximately two and a half weeks ago, she experienced an episode of chest discomfort that began after swallowing her medication, with pain radiating from her chest down her right arm. This discomfort is atypical for her, as it usually affects her left arm. The episode lasted about ten minutes and was described as 'really discomfort' that sometimes causes arm numbness. These episodes occur infrequently, not even weekly, and sometimes happen without any apparent trigger.  She denies experiencing chest discomfort while walking. No swelling in her legs is noted. She reports a weight gain of fifteen to twenty pounds, which she feels has not been helpful.  Her family history is significant for heart problems, as her mother has had numerous heart issues. She is concerned about her heart health due to  this family history.  She denies smoking.     Current Medications: Current Meds  Medication Sig   acetaminophen  (TYLENOL ) 500 MG tablet Take 1,000 mg by mouth every 6 (six) hours as needed for mild pain (pain score 1-3).   ARIPiprazole  (ABILIFY ) 15 MG tablet Take 1 tablet (15 mg total) by mouth daily.   atorvastatin  (LIPITOR) 20 MG tablet Take 1  tablet (20 mg total) by mouth daily.   celecoxib  (CELEBREX ) 100 MG capsule Take 1 capsule (100 mg total) by mouth daily.   Cyanocobalamin  (VITAMIN B-12) 5000 MCG TBDP Take 500 mcg by mouth daily.   FLUoxetine  (PROZAC ) 10 MG capsule Take 1 capsule (10 mg total) by mouth daily.   metoprolol tartrate (LOPRESSOR) 100 MG tablet Take 1 tablet (100 mg total) by mouth once for 1 dose. Take 90-120 minutes prior to scan.   sertraline  (ZOLOFT ) 100 MG tablet Take 2 tablets (200 mg total) by mouth daily.   solifenacin  (VESICARE ) 10 MG tablet Take 1 tablet (10 mg total) by mouth daily.   traZODone  (DESYREL ) 50 MG tablet Take 1 tablet (50 mg total) by mouth at bedtime.     Review of Systems:   Please see the history of present illness.    All other systems reviewed and are negative.     EKGs/Labs/Other Test Reviewed:   EKG: EKG from October 21, 2023 demonstrates sinus rhythm with prolonged QTc of 540 ms  EKG Interpretation Date/Time:  Friday April 20 2024 15:47:25 EDT Ventricular Rate:  77 PR Interval:  122 QRS Duration:  82 QT Interval:  380 QTC Calculation: 430 R Axis:   7  Text Interpretation: Normal sinus rhythm Normal ECG When compared with ECG of 23-Oct-2023 13:42, No significant change was found Confirmed by [name] ([id number]) on 04/20/2024 3:51:23 PM        CARDIAC STUDIES: Refer to CV Procedures and Imaging Tabs   Risk Assessment/Calculations:          Physical Exam:   VS:  BP 120/74   Pulse 83   Ht 5' 7 (1.702 m)   Wt 190 lb 12.8 oz (86.5 kg)   SpO2 95%   BMI 29.88 kg/m        Wt Readings from Last 3 Encounters:  04/20/24 190 lb 12.8 oz (86.5 kg)  02/07/24 170 lb (77.1 kg)  02/01/24 179 lb 8 oz (81.4 kg)      GENERAL:  No apparent distress, AOx3 HEENT:  No carotid bruits, +2 carotid impulses, no scleral icterus CAR: RRR no murmurs, gallops, rubs, or thrills RES:  Clear to auscultation bilaterally ABD:  Soft, nontender, nondistended, positive bowel sounds x  4 VASC:  +2 radial pulses, +2 carotid pulses NEURO:  CN 2-12 grossly intact; motor and sensory grossly intact PSYCH:  No active depression or anxiety EXT:  No edema, ecchymosis, or cyanosis  Signed, Haws MARLA Wendel, MD  04/20/2024 4:34 PM    [facility] [address] [place], [place], [place]  [address] Phone: [phone]; Fax: [phone]   Note:  This document was prepared using Dragon voice recognition software and may include unintentional dictation errors.

## 2024-04-18 ENCOUNTER — Ambulatory Visit (INDEPENDENT_AMBULATORY_CARE_PROVIDER_SITE_OTHER): Payer: MEDICAID | Admitting: Orthopedic Surgery

## 2024-04-18 ENCOUNTER — Encounter: Payer: Self-pay | Admitting: Orthopedic Surgery

## 2024-04-18 ENCOUNTER — Ambulatory Visit: Payer: MEDICAID | Admitting: Physical Therapy

## 2024-04-18 DIAGNOSIS — Z9889 Other specified postprocedural states: Secondary | ICD-10-CM

## 2024-04-18 NOTE — Progress Notes (Signed)
 Post-Op Visit Note   Patient: Maria Blackburn           Date of Birth: [birth year]           MRN: [id number] Visit Date: 04/18/2024 PCP: Lorren Greig PARAS, NP   Assessment & Plan:  Chief Complaint:  Chief Complaint  Patient presents with   Right Shoulder - Routine Post Op   Visit Diagnoses:  1. S/P shoulder surgery     Plan: Maria Blackburn is now about 2 months and a week out from right shoulder arthroscopy with rotator cuff tear repair.  She also had some adhesive capsulitis at the time.  She has been working with therapy on range of motion as well as strengthening.  On exam range of motion is 45/85/140.  This is a big improvement compared to last clinic visit.  Taking Tylenol  and ibuprofen .  She is doing better with sleep.  She has a little slight amount of crepitus at 90 degrees of abduction with internal and external rotation.  Biceps contour is normal.  Plan at this time is to continue with therapy and really focus on continuing to get range of motion back.  60-month return for final check.  I think she is okay to transition from physical therapy to home exercise program when her physical therapy runs out at the end of the month.  Follow-Up Instructions: No follow-ups on file.   Orders:  No orders of the defined types were placed in this encounter.  No orders of the defined types were placed in this encounter.   Imaging: No results found.  PMFS History: Patient Active Problem List   Diagnosis Date Noted   Degenerative superior labral anterior-to-posterior (SLAP) tear of right shoulder 02/26/2024   Synovitis of right shoulder 02/26/2024   Biceps tendonitis, right 02/26/2024   Major depressive disorder, recurrent episode, moderate (HCC) 02/21/2024   PTSD (post-traumatic stress disorder) 10/22/2023   MDD (major depressive disorder), recurrent, severe, with psychosis (HCC) 10/21/2023   Acute psychosis (HCC) 09/29/2023   GAD (generalized anxiety disorder) 09/29/2023   Adhesive  capsulitis of right shoulder 09/16/2023   Nontraumatic complete tear of right rotator cuff 09/16/2023   De Quervain's tenosynovitis, left 09/10/2021   Lateral epicondylitis, right elbow 09/10/2021   Allergy to environmental factors 10/07/2017   Past Medical History:  Diagnosis Date   Anxiety    Bladder spasms    Frequency of urination    GERD (gastroesophageal reflux disease)    Interstitial cystitis    Pelvic pain    SUI (stress urinary incontinence, female)    Urgency of urination     Family History  Problem Relation Age of Onset   Heart disease Mother    Diabetes Son     Past Surgical History:  Procedure Laterality Date   ABDOMINAL HYSTERECTOMY  2000   ovaries remain   BICEPT TENODESIS Right 02/07/2024   Procedure: TENODESIS, BICEPS, RIGHT;  Surgeon: Addie Cordella Hamilton, MD;  Location: [facility] OR;  Service: Orthopedics;  Laterality: Right;   CYSTO WITH HYDRODISTENSION N/A 03/24/2017   Procedure: CYSTOSCOPY/HYDRODISTENSION INSTILLATION OF MARCAINE  AND PYRIDIUM ;  Surgeon: Watt Rush, MD;  Location: [facility];  Service: Urology;  Laterality: N/A;   HYSTEROSCOPY WITH D & C  1999   POLYPECTOMY   POSTERIOR LUMBAR FUSION 2 WITH HARDWARE REMOVAL Right 02/07/2024   Procedure: ARTHROSCOPY, SHOULDER WITH DEBRIDEMENT, RIGHT;  Surgeon: Addie Cordella Hamilton, MD;  Location: [facility] OR;  Service: Orthopedics;  Laterality: Right;  right shoulder  arthroscopy, debridement, biceps tenodesis, mini open rotator cuff tear repair   SHOULDER OPEN ROTATOR CUFF REPAIR Right 02/07/2024   Procedure: REPAIR, ROTATOR CUFF, OPEN, RIGHT;  Surgeon: Addie Cordella Hamilton, MD;  Location: [facility] OR;  Service: Orthopedics;  Laterality: Right;   TUBAL LIGATION Bilateral 1996   Social History   Occupational History   Not on file  Tobacco Use   Smoking status: Never   Smokeless tobacco: Never  Vaping Use   Vaping status: Never Used  Substance and Sexual Activity   Alcohol use: No   Drug use: No   Sexual  activity: Yes

## 2024-04-20 ENCOUNTER — Ambulatory Visit: Payer: MEDICAID | Attending: Internal Medicine | Admitting: Internal Medicine

## 2024-04-20 VITALS — BP 120/74 | HR 83 | Ht 67.0 in | Wt 190.8 lb

## 2024-04-20 DIAGNOSIS — R079 Chest pain, unspecified: Secondary | ICD-10-CM

## 2024-04-20 DIAGNOSIS — R9431 Abnormal electrocardiogram [ECG] [EKG]: Secondary | ICD-10-CM | POA: Diagnosis present

## 2024-04-20 DIAGNOSIS — Z79899 Other long term (current) drug therapy: Secondary | ICD-10-CM

## 2024-04-20 DIAGNOSIS — Z01812 Encounter for preprocedural laboratory examination: Secondary | ICD-10-CM

## 2024-04-20 DIAGNOSIS — R072 Precordial pain: Secondary | ICD-10-CM | POA: Diagnosis present

## 2024-04-20 MED ORDER — METOPROLOL TARTRATE 100 MG PO TABS: 100.0000 mg | ORAL_TABLET | Freq: Once | ORAL | 0 refills | Status: DC

## 2024-04-20 NOTE — Patient Instructions (Signed)
 Medication Instructions:  Your physician recommends that you continue on your current medications as directed. Please refer to the Current Medication list given to you today.  *If you need a refill on your cardiac medications before your next appointment, please call your pharmacy*  Lab Work: BMET Today on first floor. If you have labs (blood work) drawn today and your tests are completely normal, you will receive your results only by: MyChart Message (if you have MyChart) OR A paper copy in the mail If you have any lab test that is abnormal or we need to change your treatment, we will call you to review the results.  Testing/Procedures: Your physician has requested that you have an echocardiogram. Echocardiography is a painless test that uses sound waves to create images of your heart. It provides your doctor with information about the size and shape of your heart and how well your heart's chambers and valves are working. This procedure takes approximately one hour. There are no restrictions for this procedure. Please do NOT wear cologne, perfume, aftershave, or lotions (deodorant is allowed). Please arrive 15 minutes prior to your appointment time.  Please note: We ask at that you not bring children with you during ultrasound (echo/ vascular) testing. Due to room size and safety concerns, children are not allowed in the ultrasound rooms during exams. Our front office staff cannot provide observation of children in our lobby area while testing is being conducted. An adult accompanying a patient to their appointment will only be allowed in the ultrasound room at the discretion of the ultrasound technician under special circumstances. We apologize for any inconvenience.     Your cardiac CT will be scheduled at one of the below locations:   [facility] [address] [place], [place] [address] [phone] (Severe contrast allergies only)  OR   [facility] [address]  [place], [place] [address]  If scheduled at [facility], please arrive at the [facility] and Children's Entrance (Entrance C2) of [facility] 30 minutes prior to test start time. You can use the FREE valet parking offered at entrance C (encouraged to control the heart rate for the test)  Proceed to the [facility] Radiology Department (first floor) to check-in and test prep.  All radiology patients and guests should use entrance C2 at [facility], accessed from [facility], even though the hospital's physical address listed is [address].  If scheduled at the Heart and Vascular Tower at [address], please enter the parking lot using the [address] entrance and use the FREE valet service at the patient drop-off area. Enter the building and check-in with registration on the main floor.   Please follow these instructions carefully (unless otherwise directed):  An IV will be required for this test and Nitroglycerin will be given.  Hold all erectile dysfunction medications at least 3 days (72 hrs) prior to test. (Ie viagra, cialis, sildenafil, tadalafil, etc)   On the Night Before the Test: Be sure to Drink plenty of water . Do not consume any caffeinated/decaffeinated beverages or chocolate 12 hours prior to your test. Do not take any antihistamines 12 hours prior to your test.  On the Day of the Test: Drink plenty of water  until 1 hour prior to the test. Do not eat any food 1 hour prior to test. You may take your regular medications prior to the test.  Take ONE TIME DOSE metoprolol (Lopressor) two  hours prior to test. If you take Furosemide/Hydrochlorothiazide/Spironolactone/Chlorthalidone, please HOLD on the morning of the test. Patients who wear a continuous glucose monitor MUST remove the device prior to scanning. FEMALES- please wear underwire-free bra if available, avoid dresses & tight clothing        After the Test: Drink plenty of water . After receiving IV contrast, you may experience a mild flushed feeling. This is normal. On occasion, you may experience a mild rash up to 24 hours after the test. This is not dangerous. If this occurs, you can take Benadryl  25 mg, Zyrtec, Claritin, or Allegra and increase your fluid intake. (Patients taking Tikosyn should avoid Benadryl , and may take Zyrtec, Claritin, or Allegra) If you experience trouble breathing, this can be serious. If it is severe call 911 IMMEDIATELY. If it is mild, please call our office.  We will call to schedule your test 2-4 weeks out understanding that some insurance companies will need an authorization prior to the service being performed.   For more information and frequently asked questions, please visit our website : [email or web address]  For non-scheduling related questions, please contact the cardiac imaging nurse navigator should you have any questions/concerns: Cardiac Imaging Nurse Navigators Direct Office Dial: [phone]   For scheduling needs, including cancellations and rescheduling, please call [place], [phone].   Follow-Up: At [facility], you and your health needs are our priority.  As part of our continuing mission to provide you with exceptional heart care, our providers are all part of one team.  This team includes your primary Cardiologist (physician) and Advanced Practice Providers or APPs (Physician Assistants and Nurse Practitioners) who all work together to provide you with the care you need, when you need it.  Your next appointment:   AS NEEDED  Provider:   Dr Wendel  We recommend signing up for the patient portal called MyChart.  Sign up information is provided on this After Visit Summary.  MyChart is used to connect with patients for Virtual Visits (Telemedicine).  Patients are able to view lab/test results, encounter notes, upcoming appointments, etc.   Non-urgent messages can be sent to your provider as well.   To learn more about what you can do with MyChart, go to [email or web address].

## 2024-04-21 ENCOUNTER — Ambulatory Visit: Payer: Self-pay | Admitting: Internal Medicine

## 2024-04-21 LAB — BASIC METABOLIC PANEL WITH GFR
BUN/Creatinine Ratio: 14 (ref 9–23)
BUN: 14 mg/dL (ref 6–24)
CO2: 24 mmol/L (ref 20–29)
Calcium: 9.5 mg/dL (ref 8.7–10.2)
Chloride: 100 mmol/L (ref 96–106)
Creatinine, Ser: 0.98 mg/dL (ref 0.57–1.00)
Glucose: 76 mg/dL (ref 70–99)
Potassium: 4.4 mmol/L (ref 3.5–5.2)
Sodium: 139 mmol/L (ref 134–144)
eGFR: 69 mL/min/1.73 (ref >59)

## 2024-04-24 ENCOUNTER — Ambulatory Visit: Payer: MEDICAID | Admitting: Physical Therapy

## 2024-04-24 ENCOUNTER — Encounter: Payer: Self-pay | Admitting: Physical Therapy

## 2024-04-24 DIAGNOSIS — R600 Localized edema: Secondary | ICD-10-CM

## 2024-04-24 DIAGNOSIS — M6281 Muscle weakness (generalized): Secondary | ICD-10-CM

## 2024-04-24 DIAGNOSIS — M25511 Pain in right shoulder: Secondary | ICD-10-CM | POA: Diagnosis not present

## 2024-04-24 NOTE — Therapy (Signed)
 OUTPATIENT PHYSICAL THERAPY SHOULDER TREATMENT   Patient Name: Maria Blackburn MRN: [id number] DOB:September 19, 1970, 53 y.o., female Today's Date: 04/24/2024   PT End of Session - 04/24/24 1447     Visit Number 8    Number of Visits --   1-2x/week   Date for Recertification  05/15/24    Authorization Type [employer]  MCD    Authorization Time Period 03/20/24-06/19/24    Authorization - Visit Number 8    Authorization - Number of Visits 16    PT Start Time 0245    PT Stop Time 0330    PT Time Calculation (min) 45 min           Past Medical History:  Diagnosis Date   Anxiety    Bladder spasms    Frequency of urination    GERD (gastroesophageal reflux disease)    Interstitial cystitis    Pelvic pain    SUI (stress urinary incontinence, female)    Urgency of urination    Past Surgical History:  Procedure Laterality Date   ABDOMINAL HYSTERECTOMY  2000   ovaries remain   BICEPT TENODESIS Right 02/07/2024   Procedure: TENODESIS, BICEPS, RIGHT;  Surgeon: Addie Cordella Hamilton, MD;  Location: [facility] OR;  Service: Orthopedics;  Laterality: Right;   CYSTO WITH HYDRODISTENSION N/A 03/24/2017   Procedure: CYSTOSCOPY/HYDRODISTENSION INSTILLATION OF MARCAINE  AND PYRIDIUM ;  Surgeon: Watt Rush, MD;  Location: [facility];  Service: Urology;  Laterality: N/A;   HYSTEROSCOPY WITH D & C  1999   POLYPECTOMY   POSTERIOR LUMBAR FUSION 2 WITH HARDWARE REMOVAL Right 02/07/2024   Procedure: ARTHROSCOPY, SHOULDER WITH DEBRIDEMENT, RIGHT;  Surgeon: Addie Cordella Hamilton, MD;  Location: [facility] OR;  Service: Orthopedics;  Laterality: Right;  right shoulder arthroscopy, debridement, biceps tenodesis, mini open rotator cuff tear repair   SHOULDER OPEN ROTATOR CUFF REPAIR Right 02/07/2024   Procedure: REPAIR, ROTATOR CUFF, OPEN, RIGHT;  Surgeon: Addie Cordella Hamilton, MD;  Location: [facility] OR;  Service: Orthopedics;  Laterality: Right;   TUBAL LIGATION Bilateral 1996   Patient Active Problem List    Diagnosis Date Noted   Degenerative superior labral anterior-to-posterior (SLAP) tear of right shoulder 02/26/2024   Synovitis of right shoulder 02/26/2024   Biceps tendonitis, right 02/26/2024   Major depressive disorder, recurrent episode, moderate (HCC) 02/21/2024   PTSD (post-traumatic stress disorder) 10/22/2023   MDD (major depressive disorder), recurrent, severe, with psychosis (HCC) 10/21/2023   Acute psychosis (HCC) 09/29/2023   GAD (generalized anxiety disorder) 09/29/2023   Adhesive capsulitis of right shoulder 09/16/2023   Nontraumatic complete tear of right rotator cuff 09/16/2023   De Quervain's tenosynovitis, left 09/10/2021   Lateral epicondylitis, right elbow 09/10/2021   Allergy to environmental factors 10/07/2017    PCP: Lorren Greig PARAS, NP  REFERRING PROVIDER: Addie Cordella Hamilton, MD  THERAPY DIAG:  Right shoulder pain, unspecified chronicity  Muscle weakness  Localized edema  REFERRING DIAG: S/P shoulder surgery [Z98.890]   Rationale for Evaluation and Treatment:  Rehabilitation  SUBJECTIVE:  PERTINENT PAST HISTORY:  MDD, PTSD      PRECAUTIONS:   right shoulder rotator cuff repair biceps tenodesis and debridement for early adhesive capsulitis     OP Date: 02/07/2024  2 weeks 02/21/2024  4 weeks 03/06/2024  6 weeks 03/20/2024  8 weeks 04/03/2024  10 weeks 04/17/2024  12 weeks 05/01/2024   Protocol:  [email or web address]  WEIGHT BEARING RESTRICTIONS Yes    FALLS:  Has patient fallen in last 6 months? No, Number  of falls: 0  MOI/History of condition:  Onset date: 8/5  SUBJECTIVE STATEMENT  04/24/2024:  MD gave me a good report.   Eval:  Pt is a 53 y.o. female who presents to clinic with chief complaint of R shoulder pain and stiffness following R/C repair, debridement, and biceps tenodesis.  She has  generally followed precautions but was not thinking an picked up her 35# grandchild yesterday and has been achy since.  Her pain is intermittent and achy.  Having some issue with ROM.  Previous history of AC.  Pain:  Are you having pain? Yes Pain location: R shoulder pain NPRS scale: 0/10  Best: 0/10, Worst: 5/10 Aggravating factors: stretching it out, texting, writing Relieving factors: rest Pain description: aching  Occupation: Licensed conveyancer Device: na  Hand Dominance: R  Patient Goals/Specific Activities: reduce pain, be able to use R arm   OBJECTIVE:   DIAGNOSTIC FINDINGS:  NA  GENERAL OBSERVATION: Rounded shoulders     SENSATION: Light touch: Appears intact   UPPER EXTREMITY AROM:  ROM Right (Eval) Left (Eval)  Shoulder flexion 70* 155  Shoulder abduction 30* significant shoulder hike 150  Shoulder internal rotation    Shoulder external rotation    Functional IR    Functional ER    Shoulder extension    Elbow extension    Elbow flexion     (Blank rows = not tested, N = WNL, * = concordant pain with testing)  UPPER EXTREMITY MMT:  MMT Right (Eval) Left (Eval)  Shoulder flexion    Shoulder abduction (C5)    Shoulder ER    Shoulder IR    Middle trapezius    Lower trapezius    Shoulder extension    Grip strength    Cervical flexion (C1,C2)    Cervical S/B (C3)    Shoulder shrug (C4)    Elbow flexion (C6)    Elbow ext (C7)    Thumb ext (C8)    Finger abd (T1)    Grossly     (Blank rows = not tested, score listed is out of 5 possible points.  N = WNL, D = diminished, C = clear for gross weakness with myotome testing, * = concordant pain with testing)   UPPER EXTREMITY PROM:  PROM Right (Eval) Left (Eval) Right 04/03/24 Right 04/05/24  R 10/9  Shoulder flexion 120  125 135 140 Supine  Shoulder abduction   100    Shoulder internal rotation 30      Shoulder external rotation 20 neutral  35    Functional IR       Functional ER        Shoulder extension       Elbow extension       Elbow flexion        (Blank rows = not tested, N = WNL, * = concordant pain with testing)  JOINT MOBILITY TESTING:  Hypomobile GH   PATIENT SURVEYS:  QuickDASH Score: 61.4 / 100 = 61.4 %    TODAY'S TREATMENT:  [facility] Adult PT Treatment:                                                DATE: 04/24/24 Therapeutic Exercise: Supine shoulder dowel flexion and scaption Right shoulder flexion 1# x 10  SL shoulder abduction 2 x 10  SL ER AROM 10 x 1, 1# 10 x 1  Inclined Shoulder flexion AROM 2 x 10     90-90 OH press to 90 degrees   Scaption wall slides 10 x 2  GTB rows 10 x 2  Pull downs GTB 10 x 2  IR red band 10 x 2  Updated HEP Manual Therapy: Jt mobs and PROM right shoulder      [facility] Adult PT Treatment:                                                DATE: 04/16/24 Therapeutic activity/exercise/manual Supine dowel pullovers  Supine long lever shoulder flexion 15 x 2  Side lying shoulder abduction x 10 Side lying shoulder ER AROM  OH pulleys Seated OH press x 8  Seated tricep ext per HEP x 10 ROW GTB  10 x 2  Pull downs GTB 10 x 2  Standing shoulder YTB IR right x 1 5  Standing shoulder YTB ER right x 8  Standing cane (UE ranger) shoulder flexion , scaption x 10 each  Joint mobs A/P and inferior glides  Supine shoulder PROM flexion, Abduction, ER, IR    [facility] Adult PT Treatment:                                                DATE: 04/12/24 Therapeutic Exercise/Activity PROM flexion abdct, IR, ER  Supine dowel pullover -for arcs  Supine ER AAROM Supine punch - YTB Supine shoulder flexion 10 x 2 long lever With light manual resistance to 90 Shoulder row Red band 10 x 3 Shoulder ext RTB - 3x10 Wall slides shoulder flexion , scaption x 10 each 90-90 OH press to 90 degrees    [facility] Adult PT  Treatment:                                                DATE: 04/05/24 OH pulleys  AAROM pullover- full arcs PROM flexion, abduction , ER, IR Supine horiz add with dowel AAROM Supine ER AAROM with dowel  Supine shoulder flexion long lever x 8 Side lying shoulder abduction x 8 Side lying shoulder ER x 8 Wall slides for flexion Updated HEP   [facility] Adult PT Treatment:                                                DATE: 04/03/24 AAROM chest press AAROM pullover- non painful arc PROM flexion, abduction , ER, IR Supine horiz add with dowel  Supine  ER AAROM with dowel  UE ranger 28# reach x 10  Flexion walk backs at counter OH pulleys  Seated table slides shoulder flexion Seated table slides scaption  Updated HEP     PATIENT EDUCATION ([place]/HM):  POC, diagnosis, prognosis, HEP, and outcome measures.  Pt educated via explanation, demonstration, and handout (HEP).  Pt confirms understanding verbally.   HOME EXERCISE PROGRAM: Access Code: [email or web address] URL: [email or web address] Date: 04/12/2024 Prepared by: Helene Gasmen  Exercises - Supine Shoulder Flexion Extension AAROM with Dowel  - 2 x daily - 7 x weekly - 2 sets - 10 reps - Supine Shoulder External Rotation with Dowel  - 1 x daily - 7 x weekly - 2 sets - 10 reps - Supine Shoulder Flexion Extension Full Range AROM  - 1 x daily - 7 x weekly - 1-2 sets - 8-10 reps - Sidelying Shoulder Abduction Palm Forward  - 1 x daily - 7 x weekly - 1-2 sets - 8-10 reps - Sidelying Shoulder External Rotation  - 1 x daily - 7 x weekly - 1-2 sets - 8-10 reps - Seated Single Arm Overhead Elbow Extension  - 1 x daily - 7 x weekly - 3 sets - 10 reps - Seated Overhead Press  - 1 x daily - 7 x weekly - 3 sets - 10 reps - Standing Shoulder Row with Anchored Resistance  - 1 x daily - 7 x weekly - 2 sets - 10 reps - Shoulder extension with resistance - Neutral  - 1 x daily - 7 x weekly - 2 sets - 10 reps - Shoulder External Rotation with  Anchored Resistance  - 1 x daily - 7 x weekly - 2 sets - 10 reps - Standing Shoulder Internal Rotation with Anchored Resistance  - 1 x daily - 7 x weekly - 2 sets - 10 reps  Treatment priorities   Eval  10/2      ROM        Passive -> AAROM -> AROM as appropriate   Supine/SL AROM                                ASSESSMENT:  CLINICAL IMPRESSION:  04/24/2024:  Maria Blackburn tolerated session well with no adverse reaction. Tolerating progression to light resistance cuff strengthening and scapular bands. Updated HEP.  Manual performed to decrease tension, she reported feeling much looser end of session.     EVAL:  Maria Blackburn is a 53 y.o. female who presents to clinic with signs and sxs consistent with R shoulder stiffness and pain following biceps tenodesis, R/C repair, and biceps tenodesis on 8/5.   Fairly stiff with significantly more PROM vs AROM.   Maria Blackburn will benefit from skilled PT to address relevant deficits and improve shoulder function and comfort.   OBJECTIVE IMPAIRMENTS: Pain, R shoulder ROM, R shoulder strength  ACTIVITY LIMITATIONS: reaching, lifting, childcare, housework, driving, school work, typing  PERSONAL FACTORS: See medical history and pertinent history   REHAB POTENTIAL: Good  CLINICAL DECISION MAKING: Evolving/moderate complexity  EVALUATION COMPLEXITY: Moderate   GOALS:   SHORT TERM GOALS: Target date: 04/17/2024   Maria Blackburn will be >75% HEP compliant to improve carryover between sessions and facilitate independent management of condition  Evaluation: ongoing Goal status: MET   LONG TERM GOALS: Target date: 05/15/2024   Maria Blackburn will self report >/= 50% decrease in pain from evaluation to improve function in daily tasks  Evaluation/Baseline: 10/10  max pain Goal status: INITIAL   2.  Maria Blackburn will show a >/= 30 pct improvement in her QUICK DASH score (MCID is 10% or ~5 pts) as a proxy for functional improvement   Evaluation/Baseline: QuickDASH Score: 61.4  / 100 = 61.4 % Goal status: INITIAL   3.  Maria Blackburn will be able to care for nephew, including lifting, not limited by pain  Evaluation/Baseline: limited Goal status: INITIAL   4.  Maria Blackburn will demonstrate >130 degrees of active ROM in flexion to allow completion of activities involving reaching OH, not limited by pain  Evaluation/Baseline: 70 degrees Goal status: INITIAL   5.  Maria Blackburn will be able to complete school work, not limited by pain  Evaluation/Baseline: limited Goal status: INITIAL   6.  Maria Blackburn will improve the following MMTs to >/= 4/5 to show improvement in strength:  flexion and ER   Evaluation/Baseline: not tested Goal status: INITIAL   PLAN: PT FREQUENCY: 1-2x/week  PT DURATION: 8 weeks  PLANNED INTERVENTIONS:  97164- PT Re-evaluation, 97110-Therapeutic exercises, 97530- Therapeutic activity, 97112- Neuromuscular re-education, 97535- Self Care, 02859- Manual therapy, [id number]- Gait training, [id number]- Aquatic Therapy, [phone]- Electrical stimulation (manual), [id number]- Vasopneumatic device, [id number]- Traction (mechanical), [id number]- Ionotophoresis 4mg /ml Dexamethasone , Taping, Dry Needling, Joint manipulation, and Spinal manipulation.  Harlene CHRISTELLA Persons PTA 04/24/24 3:34 PM Phone: [phone] Fax: [phone]   I just finished a MCD eval/recert.  Name: DOLORA RIDGELY  MRN: [id number] Please request 2x/week for 8 weeks.  Check all conditions that are expected to impact treatment: Musculoskeletal disorders   I DID put a charge in.  Check all possible CPT codes: 02889- Therapeutic Exercise, [phone]- Neuro Re-education, [phone] - Gait Training, [phone] - Manual Therapy, 97530 - Therapeutic Activities, 97535 - Self Care, [phone] - Re-evaluation, [id number] - Mechanical traction, and [id number] - Aquatic therapy   Thank you!  MCD - Secure

## 2024-04-24 NOTE — Progress Notes (Signed)
 [facility] MD Outpatient Progress Note  Date of visit: 12/22/2023 Maria Blackburn  MRN:  [id number]  Televisit via video: I connected with patient on 05/03/24 at  1:30 PM EDT by a video enabled telemedicine application and verified that I am speaking with the correct person using two identifiers.  Location: Patient: Home Provider: Clinic   I discussed the limitations of evaluation and management by telemedicine and the availability of in person appointments. The patient expressed understanding and agreed to proceed.  I discussed the assessment and treatment plan with the patient. The patient was provided an opportunity to ask questions and all were answered. The patient agreed with the plan and demonstrated an understanding of the instructions.   The patient was advised to call back or seek an in-person evaluation if the symptoms worsen or if the condition fails to improve as anticipated.  Assessment:  Maria Blackburn presents for follow-up evaluation.  She has remained stable on the current dose of medications, has had no exacerbations in anxiety or depression.  She has been eating and sleeping well and has had no active or passive SI/HI/AVH.  She has been compliant with the medications, has had no side effects or any new physical concerns.  She continues to have some stressful factors that includes disturbed sleep at times which she is trying to manage with as needed melatonin in addition to trazodone .  She has not been using any substances including alcohol or cigarettes, enjoys music and takes care of her nephew and children.  We discussed about slowly tapering off Zoloft  in the increments of 50 mg weekly and continue rest of the same medications until then. Plan to start Prozac  20 mg alongside.  Also reiterated all these risk-benefit and side effects of the medications, encouraged continued compliance, plan to have her back in the clinic in 6 weeks.  Identifying Information: Maria Blackburn is a  53 year old female with a history of MDD with psychotic features who is an established patient with [facility] for management of auditory hallucinations and recent psychiatric hospitalizations.   Plan:  # MDD with psychotic features Interventions: -- Continue Abilify  15 mg daily - Continue trazodone  50 mg nightly - Slowly taper Zoloft  as 150 mg for 1 week, 100 mg for 1 week and 50 mg for the week further, then discontinue. -- Continue Prozac  10 mg daily for until the Zoloft  taper, increase it to 20 mg after 21 days.  #Long term use of antipsychotic medication -- Lipid panel from 10/2023 - A1c from 10/2023 - See discussion of QT interval below and EKGs - No tardive movements noted on assessment today - No weight gain on Abilify  so far  # Prolonged QT interval - Noted on EKG from 4/21 at the emergency department.  QTc B noted to be in the high 600s.  Correction with alternative formula also yields a QTc in the 600s. - Previous EKGs reviewed.  EKG from 4/20 unremarkable.  EKG from 4/18 with QTc of 499.  EKG from 3/27, unremarkable. - Overall there is some concern for prolonged QT, though I do not think it is likely a QTc of 600 will be demonstrated on subsequent EKGs. - Primary care provider referred to cardiology.  It appears to still be pending.  I gave the patient the phone number to reach out to this clinic.   Patient was given contact information for behavioral health clinic and was instructed to call 911 for emergencies.   Subjective:  Chief  Complaint:  Chief Complaint  Patient presents with   Follow-up   Medication Refill   Depression   Anxiety    Interval History:  Today, patient was seen via televisit appointment.  She reported her mood as good .  She denied any active or passive SI/HI/AVH.  When asked about depression and anxiety she stated it is better , reported that she was anxious about her nephew leaving the house.  She reported her appetite  as good, reported that she has not been able to sleep better but she would like to start melatonin by herself in addition to trazodone  for sleep.  She denied any nightmares or flashbacks, continues to have frequent awakenings due to pain.  She denied having any hallucinations.  She continues to spend time with the nephew, taking care of groceries, cooking and enjoys music.  There are no safety concerns reported.  She has not been using any substances including alcohol or cigarettes.  She reported compliance of the medications, denied any physical concerns or side effects.  We discussed about tapering Zoloft  for now and keeping rest of the same medications, increase Prozac  to 20 mg alongside, until her next appointment.  Encouraged her to call the clinic if she had any side effects or any new concerns.  Plan to have her back in the clinic in 4 to 6 weeks.   Visit Diagnosis:    ICD-10-CM   1. GAD (generalized anxiety disorder)  F41.1 traZODone  (DESYREL ) 50 MG tablet    FLUoxetine  (PROZAC ) 20 MG capsule    sertraline  (ZOLOFT ) 100 MG tablet    2. MDD (major depressive disorder), recurrent, severe, with psychosis (HCC)  F33.3 traZODone  (DESYREL ) 50 MG tablet    FLUoxetine  (PROZAC ) 20 MG capsule    ARIPiprazole  (ABILIFY ) 15 MG tablet    sertraline  (ZOLOFT ) 100 MG tablet    3. PTSD (post-traumatic stress disorder)  F43.10 traZODone  (DESYREL ) 50 MG tablet    sertraline  (ZOLOFT ) 100 MG tablet        Past Psychiatric History: None prior to psychiatric hospitalizations  Past Medical History:  Past Medical History:  Diagnosis Date   Anxiety    Bladder spasms    Frequency of urination    GERD (gastroesophageal reflux disease)    Interstitial cystitis    Pelvic pain    SUI (stress urinary incontinence, female)    Urgency of urination     Past Surgical History:  Procedure Laterality Date   ABDOMINAL HYSTERECTOMY  2000   ovaries remain   BICEPT TENODESIS Right 02/07/2024   Procedure:  TENODESIS, BICEPS, RIGHT;  Surgeon: Addie Cordella Hamilton, MD;  Location: [facility] OR;  Service: Orthopedics;  Laterality: Right;   CYSTO WITH HYDRODISTENSION N/A 03/24/2017   Procedure: CYSTOSCOPY/HYDRODISTENSION INSTILLATION OF MARCAINE  AND PYRIDIUM ;  Surgeon: Watt Rush, MD;  Location: [facility];  Service: Urology;  Laterality: N/A;   HYSTEROSCOPY WITH D & C  1999   POLYPECTOMY   POSTERIOR LUMBAR FUSION 2 WITH HARDWARE REMOVAL Right 02/07/2024   Procedure: ARTHROSCOPY, SHOULDER WITH DEBRIDEMENT, RIGHT;  Surgeon: Addie Cordella Hamilton, MD;  Location: [facility] OR;  Service: Orthopedics;  Laterality: Right;  right shoulder arthroscopy, debridement, biceps tenodesis, mini open rotator cuff tear repair   SHOULDER OPEN ROTATOR CUFF REPAIR Right 02/07/2024   Procedure: REPAIR, ROTATOR CUFF, OPEN, RIGHT;  Surgeon: Addie Cordella Hamilton, MD;  Location: [facility] OR;  Service: Orthopedics;  Laterality: Right;   TUBAL LIGATION Bilateral 1996    Family Psychiatric History: None pertinent  Family History:  Family History  Problem Relation Age of Onset   Heart disease Mother    Diabetes Son     Social History:  Social History   Socioeconomic History   Marital status: Married    Spouse name: Not on file   Number of children: Not on file   Years of education: Not on file   Highest education level: Not on file  Occupational History   Not on file  Tobacco Use   Smoking status: Never   Smokeless tobacco: Never  Vaping Use   Vaping status: Never Used  Substance and Sexual Activity   Alcohol use: No   Drug use: No   Sexual activity: Yes  Other Topics Concern   Not on file  Social History Narrative   Not on file   Social Drivers of Health   Financial Resource Strain: Low Risk  (03/25/2023)   Overall Financial Resource Strain (CARDIA)    Difficulty of Paying Living Expenses: Not very hard  Food Insecurity: No Food Insecurity (10/21/2023)   Hunger Vital Sign    Worried About Running Out of Food in  the Last Year: Never true    Ran Out of Food in the Last Year: Never true  Transportation Needs: No Transportation Needs (10/21/2023)   PRAPARE - Administrator, Civil Service (Medical): No    Lack of Transportation (Non-Medical): No  Physical Activity: Patient Unable To Answer (03/25/2023)   Exercise Vital Sign    Days of Exercise per Week: Patient unable to answer    Minutes of Exercise per Session: Patient unable to answer  Stress: Patient Unable To Answer (03/25/2023)   [employer] of Occupational Health - Occupational Stress Questionnaire    Feeling of Stress : Patient unable to answer  Social Connections: Socially Integrated (10/21/2023)   Social Connection and Isolation Panel    Frequency of Communication with Friends and Family: More than three times a week    Frequency of Social Gatherings with Friends and Family: More than three times a week    Attends Religious Services: More than 4 times per year    Active Member of [employer] or Organizations: Yes    Attends Engineer, Structural: More than 4 times per year    Marital Status: Married    Allergies: No Known Allergies  Current Medications: Current Outpatient Medications  Medication Sig Dispense Refill   acetaminophen  (TYLENOL ) 500 MG tablet Take 1,000 mg by mouth every 6 (six) hours as needed for mild pain (pain score 1-3).     amoxicillin  (AMOXIL ) 500 MG capsule Take 1 capsule (500 mg total) by mouth 3 (three) times daily for 10 days. 30 capsule 0   ARIPiprazole  (ABILIFY ) 15 MG tablet Take 1 tablet (15 mg total) by mouth daily. 30 tablet 1   atorvastatin  (LIPITOR) 20 MG tablet Take 1 tablet (20 mg total) by mouth daily. 30 tablet 0   celecoxib  (CELEBREX ) 100 MG capsule Take 1 capsule (100 mg total) by mouth daily. 60 capsule 0   Cyanocobalamin  (VITAMIN B-12) 5000 MCG TBDP Take 500 mcg by mouth daily.     FLUoxetine  (PROZAC ) 20 MG capsule Take 1 capsule (20 mg total) by mouth daily. Take as prescribed 30  capsule 1   fluticasone (FLONASE) 50 MCG/ACT nasal spray Place 2 sprays into both nostrils daily. 16 g 6   loratadine (CLARITIN) 10 MG tablet Take 1 tablet (10 mg total) by mouth daily. 30 tablet 11   metoprolol  tartrate (LOPRESSOR) 100 MG tablet Take 1 tablet (100 mg total) by mouth once for 1 dose. Take 90-120 minutes prior to scan. 1 tablet 0   sertraline  (ZOLOFT ) 100 MG tablet Take 1.5 tablets (150 mg total) by mouth daily for 7 days, THEN 1 tablet (100 mg total) daily for 7 days, THEN 0.5 tablets (50 mg total) daily for 7 days. 21 tablet 0   solifenacin  (VESICARE ) 10 MG tablet Take 1 tablet (10 mg total) by mouth daily. 30 tablet 11   traZODone  (DESYREL ) 50 MG tablet Take 1 tablet (50 mg total) by mouth at bedtime. 30 tablet 1   No current facility-administered medications for this visit.     Objective:  Psychiatric Specialty Exam: Physical Exam Constitutional:      Appearance: the patient is not toxic-appearing.  Pulmonary:     Effort: Pulmonary effort is normal.  Neurological:     General: No focal deficit present.     Mental Status: the patient is alert and oriented to person, place, and time.   Review of Systems  Respiratory:  Negative for shortness of breath.   Cardiovascular:  Negative for chest pain.  Gastrointestinal:  Negative for abdominal pain, constipation, diarrhea, nausea and vomiting.  Neurological:  Negative for headaches.      There were no vitals taken for this visit.  General Appearance: Fairly Groomed  Eye Contact:  Good  Speech:  Clear and Coherent  Volume:  Normal  Mood:  Euthymic  Affect:  Congruent  Thought Process:  Coherent  Orientation:  Full (Time, Place, and Person)  Thought Content: Logical   Suicidal Thoughts:  No  Homicidal Thoughts:  No  Memory:  Immediate;   Good  Judgement:  fair  Insight:  fair  Psychomotor Activity:  Normal  Concentration:  Concentration: Good  Recall:  Good  Fund of Knowledge: Good  Language: Good   Akathisia:  No  Handed:    AIMS (if indicated): not done  Assets:  Communication Skills Desire for Improvement Financial Resources/Insurance Housing Leisure Time Physical Health  ADL's:  Intact  Cognition: WNL       Metabolic Disorder Labs: Lab Results  Component Value Date   HGBA1C 5.9 (H) 07/07/2023   No results found for: PROLACTIN Lab Results  Component Value Date   CHOL 247 (H) 08/12/2023   TRIG 135 08/12/2023   HDL 54 08/12/2023   CHOLHDL 4.6 (H) 08/12/2023   VLDL 18 03/20/2016   LDLCALC 169 (H) 08/12/2023   LDLCALC 147 (H) 07/07/2023   Lab Results  Component Value Date   TSH 0.964 10/05/2023   TSH 0.262 (L) 09/29/2023    Therapeutic Level Labs: No results found for: LITHIUM No results found for: VALPROATE No results found for: CBMZ  Screenings: AUDIT    Flowsheet Row [facility]  Alcohol Use Disorder Identification Test Final Score (AUDIT) 0 0   GAD-7    Flowsheet Row Office Visit from 11/04/2023 in [facility] Office Visit from 10/31/2023 in [facility] Office Visit from 02/02/2023 in [facility]  Total GAD-7 Score 0 3 1   PHQ2-9    Flowsheet Row Office Visit from 04/30/2024 in [facility] Video Visit from 03/29/2024 in [facility] Office Visit from 11/04/2023 in [facility]  at [facility] Office Visit from 10/31/2023 in [facility] ED from 09/29/2023 in [facility] Department at [facility]  PHQ-2 Total Score 0 0 0 1 2  PHQ-9 Total Score -- -- 3 4 9    Flowsheet Row Admission (Discharged) from 02/07/2024 in [place] [facility] Office Visit from 10/31/2023 in [facility] Ad[facility] (Discharged[facility]  C-SSRS RISK CATEGORY No Risk No Risk No Risk    Collaboration of Care: none  A total of 30 minutes was spent involved in face to face clinical care, chart review, documentation.   Jacoria Keiffer, MD 05/03/2024, 4:03 PM

## 2024-04-26 ENCOUNTER — Ambulatory Visit: Payer: MEDICAID | Admitting: Physical Therapy

## 2024-04-26 ENCOUNTER — Encounter: Payer: Self-pay | Admitting: Physical Therapy

## 2024-04-26 DIAGNOSIS — M25511 Pain in right shoulder: Secondary | ICD-10-CM

## 2024-04-26 DIAGNOSIS — M6281 Muscle weakness (generalized): Secondary | ICD-10-CM

## 2024-04-26 DIAGNOSIS — R600 Localized edema: Secondary | ICD-10-CM

## 2024-04-26 NOTE — Therapy (Signed)
 OUTPATIENT PHYSICAL THERAPY SHOULDER TREATMENT   Patient Name: Maria Blackburn MRN: [id number] DOB:Feb 17, 1971, 53 y.o., female Today's Date: 04/26/2024   PT End of Session - 04/26/24 1144     Visit Number 9    Number of Visits --   1-2x/week   Date for Recertification  05/15/24    Authorization Type [employer]  MCD    Authorization Time Period 03/20/24-06/19/24    Authorization - Visit Number 9    Authorization - Number of Visits 16    PT Start Time 1145    PT Stop Time 1226    PT Time Calculation (min) 41 min           Past Medical History:  Diagnosis Date   Anxiety    Bladder spasms    Frequency of urination    GERD (gastroesophageal reflux disease)    Interstitial cystitis    Pelvic pain    SUI (stress urinary incontinence, female)    Urgency of urination    Past Surgical History:  Procedure Laterality Date   ABDOMINAL HYSTERECTOMY  2000   ovaries remain   BICEPT TENODESIS Right 02/07/2024   Procedure: TENODESIS, BICEPS, RIGHT;  Surgeon: Addie Cordella Hamilton, MD;  Location: [facility] OR;  Service: Orthopedics;  Laterality: Right;   CYSTO WITH HYDRODISTENSION N/A 03/24/2017   Procedure: CYSTOSCOPY/HYDRODISTENSION INSTILLATION OF MARCAINE  AND PYRIDIUM ;  Surgeon: Watt Rush, MD;  Location: [facility];  Service: Urology;  Laterality: N/A;   HYSTEROSCOPY WITH D & C  1999   POLYPECTOMY   POSTERIOR LUMBAR FUSION 2 WITH HARDWARE REMOVAL Right 02/07/2024   Procedure: ARTHROSCOPY, SHOULDER WITH DEBRIDEMENT, RIGHT;  Surgeon: Addie Cordella Hamilton, MD;  Location: [facility] OR;  Service: Orthopedics;  Laterality: Right;  right shoulder arthroscopy, debridement, biceps tenodesis, mini open rotator cuff tear repair   SHOULDER OPEN ROTATOR CUFF REPAIR Right 02/07/2024   Procedure: REPAIR, ROTATOR CUFF, OPEN, RIGHT;  Surgeon: Addie Cordella Hamilton, MD;  Location: [facility] OR;  Service: Orthopedics;  Laterality: Right;   TUBAL LIGATION Bilateral 1996   Patient Active Problem List    Diagnosis Date Noted   Degenerative superior labral anterior-to-posterior (SLAP) tear of right shoulder 02/26/2024   Synovitis of right shoulder 02/26/2024   Biceps tendonitis, right 02/26/2024   Major depressive disorder, recurrent episode, moderate (HCC) 02/21/2024   PTSD (post-traumatic stress disorder) 10/22/2023   MDD (major depressive disorder), recurrent, severe, with psychosis (HCC) 10/21/2023   Acute psychosis (HCC) 09/29/2023   GAD (generalized anxiety disorder) 09/29/2023   Adhesive capsulitis of right shoulder 09/16/2023   Nontraumatic complete tear of right rotator cuff 09/16/2023   De Quervain's tenosynovitis, left 09/10/2021   Lateral epicondylitis, right elbow 09/10/2021   Allergy to environmental factors 10/07/2017    PCP: Lorren Greig PARAS, NP  REFERRING PROVIDER: Addie Cordella Hamilton, MD  THERAPY DIAG:  Right shoulder pain, unspecified chronicity  Muscle weakness  Localized edema  REFERRING DIAG: S/P shoulder surgery [Z98.890]   Rationale for Evaluation and Treatment:  Rehabilitation  SUBJECTIVE:  PERTINENT PAST HISTORY:  MDD, PTSD      PRECAUTIONS:   right shoulder rotator cuff repair biceps tenodesis and debridement for early adhesive capsulitis     OP Date: 02/07/2024  2 weeks 02/21/2024  4 weeks 03/06/2024  6 weeks 03/20/2024  8 weeks 04/03/2024  10 weeks 04/17/2024  12 weeks 05/01/2024   Protocol:  [email or web address]  WEIGHT BEARING RESTRICTIONS Yes    FALLS:  Has patient fallen in last 6 months? No, Number  of falls: 0  MOI/History of condition:  Onset date: 8/5  SUBJECTIVE STATEMENT  04/26/2024:  Pt reports her R shoulder is a little looser today.   Eval:  Pt is a 53 y.o. female who presents to clinic with chief complaint of R shoulder pain and stiffness following R/C repair, debridement, and biceps  tenodesis.  She has generally followed precautions but was not thinking an picked up her 35# grandchild yesterday and has been achy since.  Her pain is intermittent and achy.  Having some issue with ROM.  Previous history of AC.  Pain:  Are you having pain? Yes Pain location: R shoulder pain NPRS scale: 0/10  Best: 0/10, Worst: 5/10 Aggravating factors: stretching it out, texting, writing Relieving factors: rest Pain description: aching  Occupation: Licensed conveyancer Device: na  Hand Dominance: R  Patient Goals/Specific Activities: reduce pain, be able to use R arm   OBJECTIVE:   DIAGNOSTIC FINDINGS:  NA  GENERAL OBSERVATION: Rounded shoulders     SENSATION: Light touch: Appears intact   UPPER EXTREMITY AROM:  ROM Right (Eval) Left (Eval) R 10/23  Shoulder flexion 70* 155 120  Shoulder abduction 30* significant shoulder hike 150   Shoulder internal rotation     Shoulder external rotation     Functional IR     Functional ER     Shoulder extension     Elbow extension     Elbow flexion      (Blank rows = not tested, N = WNL, * = concordant pain with testing)  UPPER EXTREMITY MMT:  MMT Right (Eval) Left (Eval)  Shoulder flexion    Shoulder abduction (C5)    Shoulder ER    Shoulder IR    Middle trapezius    Lower trapezius    Shoulder extension    Grip strength    Cervical flexion (C1,C2)    Cervical S/B (C3)    Shoulder shrug (C4)    Elbow flexion (C6)    Elbow ext (C7)    Thumb ext (C8)    Finger abd (T1)    Grossly     (Blank rows = not tested, score listed is out of 5 possible points.  N = WNL, D = diminished, C = clear for gross weakness with myotome testing, * = concordant pain with testing)   UPPER EXTREMITY PROM:  PROM Right (Eval) Left (Eval) Right 04/03/24 Right 04/05/24  R 10/9  Shoulder flexion 120  125 135 140 Supine  Shoulder abduction   100    Shoulder internal rotation 30      Shoulder external rotation 20 neutral  35     Functional IR       Functional ER       Shoulder extension       Elbow extension       Elbow flexion        (Blank rows = not tested, N = WNL, * = concordant pain with testing)  JOINT MOBILITY TESTING:  Hypomobile GH   PATIENT SURVEYS:  QuickDASH Score: 61.4 / 100 = 61.4 %    TODAY'S TREATMENT:  [facility] Adult PT Treatment:                                                DATE: 04/26/24 Therapeutic Exercise: Pball chest press Pball supine flexion Supine flexion AROM Supine ER IR - 1# Supine salute - 1# Blue TB row - 2x10 GTB shoulder ext - 2x10  Therapeutic Activity  Towel slide flexion W/ lift off Scaption 90-90 OH press  Manual Therapy: Jt mobs and PROM right shoulder   [facility] Adult PT Treatment:                                                DATE: 04/24/24 Therapeutic Exercise: Supine shoulder dowel flexion and scaption Right shoulder flexion 1# x 10  SL shoulder abduction 2 x 10  SL ER AROM 10 x 1, 1# 10 x 1  Inclined Shoulder flexion AROM 2 x 10     90-90 OH press to 90 degrees   Scaption wall slides 10 x 2  GTB rows 10 x 2  Pull downs GTB 10 x 2  IR red band 10 x 2  Updated HEP Manual Therapy: Jt mobs and PROM right shoulder      HOME EXERCISE PROGRAM: Access Code: [email or web address] URL: [email or web address] Date: 04/26/2024 Prepared by: Helene Gasmen  Exercises - Supine Shoulder External Rotation with Dowel  - 1 x daily - 7 x weekly - 2 sets - 10 reps - Supine Shoulder Flexion Extension Full Range AROM  - 1 x daily - 7 x weekly - 1-2 sets - 8-10 reps - Sidelying Shoulder Abduction Palm Forward  - 1 x daily - 7 x weekly - 1-2 sets - 8-10 reps - Sidelying Shoulder External Rotation  - 1 x daily - 7 x weekly - 1-2 sets - 8-10 reps - Seated Overhead Press  - 1 x daily - 7 x weekly - 3 sets - 10 reps - Standing Shoulder  Row with Anchored Resistance  - 1 x daily - 7 x weekly - 2 sets - 10 reps - Shoulder extension with resistance - Neutral  - 1 x daily - 7 x weekly - 2 sets - 10 reps - Shoulder External Rotation with Anchored Resistance  - 1 x daily - 7 x weekly - 2 sets - 10 reps - Standing Shoulder Internal Rotation with Anchored Resistance  - 1 x daily - 7 x weekly - 2 sets - 10 reps - Shoulder Flexion Wall Slide with Towel  - 1 x daily - 7 x weekly - 2 sets - 10 reps  Treatment priorities   Eval  10/2      ROM        Passive -> AAROM -> AROM as appropriate   Supine/SL AROM                                ASSESSMENT:  CLINICAL IMPRESSION:  04/26/2024:  Maria Blackburn tolerated session well with no adverse reaction. Doing well overall with addition of cuff strengthening.  Focused on AAROM to AROM progression today with good tolerance.  Some fatigue noted in UT which may be attributed to compensation with OH reaching.  EVAL:  Maria Blackburn is a 53 y.o. female who presents to clinic with signs and sxs consistent with R shoulder stiffness and pain following biceps tenodesis, R/C repair, and biceps tenodesis on 8/5.   Fairly stiff with significantly more PROM vs AROM.   Maria Blackburn will benefit from skilled PT to address relevant deficits and improve shoulder function and comfort.   OBJECTIVE IMPAIRMENTS: Pain, R shoulder ROM, R shoulder strength  ACTIVITY LIMITATIONS: reaching, lifting, childcare, housework, driving, school work, typing  PERSONAL FACTORS: See medical history and pertinent history   REHAB POTENTIAL: Good  CLINICAL DECISION MAKING: Evolving/moderate complexity  EVALUATION COMPLEXITY: Moderate   GOALS:   SHORT TERM GOALS: Target date: 04/17/2024   Avis will be >75% HEP compliant to improve carryover between sessions and facilitate independent management of condition  Evaluation: ongoing Goal status: MET   LONG TERM GOALS: Target date: 05/15/2024   Maria Blackburn will self report >/= 50%  decrease in pain from evaluation to improve function in daily tasks  Evaluation/Baseline: 10/10 max pain Goal status: INITIAL   2.  Maria Blackburn will show a >/= 30 pct improvement in her QUICK DASH score (MCID is 10% or ~5 pts) as a proxy for functional improvement   Evaluation/Baseline: QuickDASH Score: 61.4 / 100 = 61.4 % Goal status: INITIAL   3.  Maria Blackburn will be able to care for nephew, including lifting, not limited by pain  Evaluation/Baseline: limited Goal status: INITIAL   4.  Maria Blackburn will demonstrate >130 degrees of active ROM in flexion to allow completion of activities involving reaching OH, not limited by pain  Evaluation/Baseline: 70 degrees Goal status: INITIAL   5.  Maria Blackburn will be able to complete school work, not limited by pain  Evaluation/Baseline: limited Goal status: INITIAL   6.  Maria Blackburn will improve the following MMTs to >/= 4/5 to show improvement in strength:  flexion and ER   Evaluation/Baseline: not tested Goal status: INITIAL   PLAN: PT FREQUENCY: 1-2x/week  PT DURATION: 8 weeks  PLANNED INTERVENTIONS:  97164- PT Re-evaluation, 97110-Therapeutic exercises, 97530- Therapeutic activity, 97112- Neuromuscular re-education, 97535- Self Care, 02859- Manual therapy, [id number]- Gait training, [id number]- Aquatic Therapy, [phone]- Electrical stimulation (manual), [id number]- Vasopneumatic device, [id number]- Traction (mechanical), [id number]- Ionotophoresis 4mg /ml Dexamethasone , Taping, Dry Needling, Joint manipulation, and Spinal manipulation.  Helene BRAVO Pocahontas Cohenour PT 04/26/24 12:34 PM Phone: [phone] Fax: [phone]   I just finished a MCD eval/recert.  Name: Maria Blackburn  MRN: [id number] Please request 2x/week for 8 weeks.  Check all conditions that are expected to impact treatment: Musculoskeletal disorders   I DID put a charge in.  Check all possible CPT codes: 02889- Therapeutic Exercise, [phone]- Neuro Re-education, [phone] - Gait Training, [phone] - Manual Therapy, 97530 -  Therapeutic Activities, 97535 - Self Care, [phone] - Re-evaluation, [id number] - Mechanical traction, and [id number] - Aquatic therapy   Thank you!  MCD - Secure

## 2024-04-30 ENCOUNTER — Ambulatory Visit: Payer: Self-pay

## 2024-04-30 ENCOUNTER — Encounter (INDEPENDENT_AMBULATORY_CARE_PROVIDER_SITE_OTHER): Payer: Self-pay | Admitting: Primary Care

## 2024-04-30 ENCOUNTER — Ambulatory Visit (INDEPENDENT_AMBULATORY_CARE_PROVIDER_SITE_OTHER): Payer: MEDICAID | Admitting: Primary Care

## 2024-04-30 VITALS — BP 134/86 | HR 71 | Resp 16 | Ht 66.0 in | Wt 192.4 lb

## 2024-04-30 DIAGNOSIS — J0100 Acute maxillary sinusitis, unspecified: Secondary | ICD-10-CM

## 2024-04-30 DIAGNOSIS — K0889 Other specified disorders of teeth and supporting structures: Secondary | ICD-10-CM

## 2024-04-30 MED ORDER — LORATADINE 10 MG PO TABS
10.0000 mg | ORAL_TABLET | Freq: Every day | ORAL | 11 refills | Status: AC
Start: 2024-04-30 — End: ?

## 2024-04-30 MED ORDER — AMOXICILLIN 500 MG PO CAPS: 500.0000 mg | ORAL_CAPSULE | Freq: Three times a day (TID) | ORAL | 0 refills | Status: AC

## 2024-04-30 MED ORDER — FLUCONAZOLE 150 MG PO TABS: 150.0000 mg | ORAL_TABLET | Freq: Once | ORAL | 0 refills | Status: AC

## 2024-04-30 MED ORDER — FLUTICASONE PROPIONATE 50 MCG/ACT NA SUSP: 2.0000 | Freq: Every day | NASAL | 6 refills | Status: AC

## 2024-04-30 NOTE — Telephone Encounter (Signed)
 Report to the Emergency Department/Urgent Care/call 911 for immediate medical evaluation. Follow-up with Primary Care.

## 2024-04-30 NOTE — Progress Notes (Signed)
 [facility]  Maria Blackburn, is a 53 y.o. female  RDW:[id number]  FMW:[id number]  DOB - October 07, 1970  No chief complaint on file.      Subjective:   Maria Blackburn is a 53 y.o. female here today for an acute visit.  Patient primary care provider is Ms. Amy Mannie.  Patient is here today for acute pain on the right side of her face and tooth pain second molar is chipped.  She has nasal congestion right greater than left red swollen turbinates and frontal and maxillary tenderness.  Also, preceded by headaches.  No problems updated.  Comprehensive ROS Pertinent positive and negative noted in HPI   No Known Allergies  Past Medical History:  Diagnosis Date   Anxiety    Bladder spasms    Frequency of urination    GERD (gastroesophageal reflux disease)    Interstitial cystitis    Pelvic pain    SUI (stress urinary incontinence, female)    Urgency of urination     Current Outpatient Medications on File Prior to Visit  Medication Sig Dispense Refill   acetaminophen  (TYLENOL ) 500 MG tablet Take 1,000 mg by mouth every 6 (six) hours as needed for mild pain (pain score 1-3).     ARIPiprazole  (ABILIFY ) 15 MG tablet Take 1 tablet (15 mg total) by mouth daily. 30 tablet 1   atorvastatin  (LIPITOR) 20 MG tablet Take 1 tablet (20 mg total) by mouth daily. 30 tablet 0   celecoxib  (CELEBREX ) 100 MG capsule Take 1 capsule (100 mg total) by mouth daily. 60 capsule 0   Cyanocobalamin  (VITAMIN B-12) 5000 MCG TBDP Take 500 mcg by mouth daily.     FLUoxetine  (PROZAC ) 10 MG capsule Take 1 capsule (10 mg total) by mouth daily. 30 capsule 1   metoprolol tartrate (LOPRESSOR) 100 MG tablet Take 1 tablet (100 mg total) by mouth once for 1 dose. Take 90-120 minutes prior to scan. 1 tablet 0   sertraline  (ZOLOFT ) 100 MG tablet Take 2 tablets (200 mg total) by mouth daily. 60 tablet 1   solifenacin  (VESICARE ) 10 MG tablet Take 1 tablet (10 mg total) by mouth daily. 30 tablet 11   traZODone   (DESYREL ) 50 MG tablet Take 1 tablet (50 mg total) by mouth at bedtime. 30 tablet 1   No current facility-administered medications on file prior to visit.   Health Maintenance  Topic Date Due   COVID-19 Vaccine (1) Never done   Hepatitis C Screening  Never done   Hepatitis B Vaccine (1 of 3 - 19+ 3-dose series) Never done   Colon Cancer Screening  Never done   Pneumococcal Vaccine for age over 63 (1 of 1 - PCV) Never done   Zoster (Shingles) Vaccine (2 of 2) 12/30/2023   Flu Shot  02/03/2024   Breast Cancer Screening  08/15/2025   Pap with HPV screening  07/06/2028   HIV Screening  Completed   HPV Vaccine  Aged Out   Meningitis B Vaccine  Aged Out   DTaP/Tdap/Td vaccine  Discontinued    Objective:   Vitals:   04/30/24 1443  BP: 134/86  Pulse: 71  Resp: 16  SpO2: 95%  Weight: 192 lb 6.4 oz (87.3 kg)  Height: 5' 6 (1.676 m)   Physical Exam Vitals reviewed.  Constitutional:      Appearance: Normal appearance. She is obese.  HENT:     Head: Normocephalic.     Right Ear: Tympanic membrane, ear canal and external ear normal.  Left Ear: Tympanic membrane, ear canal and external ear normal.     Ears:     Comments: Bilateral scant amount of soft wax removed with curette     Nose: Congestion present.     Comments: Red boggy swollen turbinates right greater than left    Mouth/Throat:     Mouth: Mucous membranes are moist.  Eyes:     Extraocular Movements: Extraocular movements intact.     Pupils: Pupils are equal, round, and reactive to light.  Cardiovascular:     Rate and Rhythm: Normal rate.  Pulmonary:     Effort: Pulmonary effort is normal.     Breath sounds: Normal breath sounds.  Abdominal:     General: Bowel sounds are normal. There is distension.     Palpations: Abdomen is soft.  Musculoskeletal:        General: Normal range of motion.     Cervical back: Normal range of motion.  Skin:    General: Skin is warm and dry.  Neurological:     Mental Status:  She is alert and oriented to person, place, and time.  Psychiatric:        Mood and Affect: Mood normal.        Behavior: Behavior normal.        Thought Content: Thought content normal.     Assessment & Plan  Diagnoses and all orders for this visit:  Acute non-recurrent maxillary sinusitis -     fluticasone (FLONASE) 50 MCG/ACT nasal spray; Place 2 sprays into both nostrils daily. -     fluconazole  (DIFLUCAN ) 150 MG tablet; Take 1 tablet (150 mg total) by mouth once for 1 dose. -     amoxicillin  (AMOXIL ) 500 MG capsule; Take 1 capsule (500 mg total) by mouth 3 (three) times daily for 10 days. -     loratadine (CLARITIN) 10 MG tablet; Take 1 tablet (10 mg total) by mouth daily.  Pain in tooth Will provide a list of dentist that may take her insurance so she can set up an appointment      Patient have been counseled extensively about nutrition and exercise. Other issues discussed during this visit include: low cholesterol diet, weight control and daily exercise, foot care, annual eye examinations at Ophthalmology, importance of adherence with medications and regular follow-up. We also discussed long term complications of uncontrolled diabetes and hypertension.   No follow-ups on file.  The patient was given clear instructions to go to ER or return to medical center if symptoms don't improve, worsen or new problems develop. The patient verbalized understanding. The patient was told to call to get lab results if they haven't heard anything in the next week.   This note has been created with Education officer, environmental. Any transcriptional errors are unintentional.   Rosaline SHAUNNA Bohr, NP 04/30/2024, 2:52 PM

## 2024-04-30 NOTE — Telephone Encounter (Signed)
 FYI Only or Action Required?: FYI only for provider.  Patient was last seen in primary care on 11/04/2023 by Lorren Greig PARAS, NP.  Called Nurse Triage reporting Headache.  Symptoms began several months ago. - but exacerbated and worsened over last 3 days  Interventions attempted: Prescription medications: prescription strength pain medication, ear drops.  Symptoms are: gradually worsening.  Triage Disposition: See Physician Within 24 Hours  Patient/caregiver understands and will follow disposition?: Yes   Copied from CRM [id number]. Topic: Clinical - Red Word Triage >> Apr 30, 2024  9:42 AM Dedra B wrote: Red Word that prompted transfer to Nurse Triage: Pt is experiencing a bad headache and throbbing pain in R ear, tooth, and back of head. Pt said she had it a couple months but has gotten worse the past two days. Warm transfer to NT. Reason for Disposition  [1] MODERATE headache (e.g., interferes with normal activities) AND [2] present > 24 hours AND [3] unexplained  (Exceptions: Pain medicines not tried, typical migraine, or headache part of viral illness.)  Answer Assessment - Initial Assessment Questions Patient states she had a rotator cuff surgery in August and they cracked her tooth during anesthesia process  1. LOCATION: Where does it hurt?      Right eye, temple, ear, gums 2. ONSET: When did the headache start? (e.g., minutes, hours, days)      A few months ago - worsened over last 3 days 3. PATTERN: Does the pain come and go, or has it been constant since it started?     Experiences pain every day 4. SEVERITY: How bad is the pain? and What does it keep you from doing?  (e.g., Scale 1-10; mild, moderate, or severe)     Pain is not controlled 6. CAUSE: What do you think is causing the headache?     See notes above 7. MIGRAINE: Have you been diagnosed with migraine headaches? If Yes, ask: Is this headache similar?      No but states she does have migraine  issues 9. OTHER SYMPTOMS: Do you have any other symptoms? (e.g., fever, stiff neck, eye pain, sore throat, cold symptoms)     Pain in temple, eye, gums  Protocols used: Headache-A-AH

## 2024-04-30 NOTE — Patient Instructions (Signed)

## 2024-04-30 NOTE — Telephone Encounter (Signed)
 I called patient with recommendations and she has already been seen today.

## 2024-05-01 ENCOUNTER — Ambulatory Visit: Payer: MEDICAID | Admitting: Physical Therapy

## 2024-05-01 ENCOUNTER — Encounter: Payer: Self-pay | Admitting: Physical Therapy

## 2024-05-01 DIAGNOSIS — M6281 Muscle weakness (generalized): Secondary | ICD-10-CM

## 2024-05-01 DIAGNOSIS — R600 Localized edema: Secondary | ICD-10-CM

## 2024-05-01 DIAGNOSIS — M25511 Pain in right shoulder: Secondary | ICD-10-CM | POA: Diagnosis not present

## 2024-05-01 NOTE — Therapy (Signed)
 OUTPATIENT PHYSICAL THERAPY SHOULDER TREATMENT   Patient Name: Maria Blackburn MRN: [id number] DOB:[birth year], 53 y.o., female Today's Date: 05/01/2024   PT End of Session - 05/01/24 1440     Visit Number 10    Number of Visits --   1-2x/week   Date for Recertification  05/15/24    Authorization Type [employer]    Authorization Time Period 03/20/24-06/19/24    Authorization - Number of Visits 16    PT Start Time 0245    PT Stop Time 0330    PT Time Calculation (min) 45 min           Past Medical History:  Diagnosis Date   Anxiety    Bladder spasms    Frequency of urination    GERD (gastroesophageal reflux disease)    Interstitial cystitis    Pelvic pain    SUI (stress urinary incontinence, female)    Urgency of urination    Past Surgical History:  Procedure Laterality Date   ABDOMINAL HYSTERECTOMY  2000   ovaries remain   BICEPT TENODESIS Right 02/07/2024   Procedure: TENODESIS, BICEPS, RIGHT;  Surgeon: Addie Cordella Hamilton, MD;  Location: [facility] OR;  Service: Orthopedics;  Laterality: Right;   CYSTO WITH HYDRODISTENSION N/A 03/24/2017   Procedure: CYSTOSCOPY/HYDRODISTENSION INSTILLATION OF MARCAINE  AND PYRIDIUM ;  Surgeon: Watt Rush, MD;  Location: [facility];  Service: Urology;  Laterality: N/A;   HYSTEROSCOPY WITH D & C  1999   POLYPECTOMY   POSTERIOR LUMBAR FUSION 2 WITH HARDWARE REMOVAL Right 02/07/2024   Procedure: ARTHROSCOPY, SHOULDER WITH DEBRIDEMENT, RIGHT;  Surgeon: Addie Cordella Hamilton, MD;  Location: [facility] OR;  Service: Orthopedics;  Laterality: Right;  right shoulder arthroscopy, debridement, biceps tenodesis, mini open rotator cuff tear repair   SHOULDER OPEN ROTATOR CUFF REPAIR Right 02/07/2024   Procedure: REPAIR, ROTATOR CUFF, OPEN, RIGHT;  Surgeon: Addie Cordella Hamilton, MD;  Location: [facility] OR;  Service: Orthopedics;  Laterality: Right;   TUBAL LIGATION Bilateral 1996   Patient Active Problem List   Diagnosis Date Noted   Degenerative  superior labral anterior-to-posterior (SLAP) tear of right shoulder 02/26/2024   Synovitis of right shoulder 02/26/2024   Biceps tendonitis, right 02/26/2024   Major depressive disorder, recurrent episode, moderate (HCC) 02/21/2024   PTSD (post-traumatic stress disorder) 10/22/2023   MDD (major depressive disorder), recurrent, severe, with psychosis (HCC) 10/21/2023   Acute psychosis (HCC) 09/29/2023   GAD (generalized anxiety disorder) 09/29/2023   Adhesive capsulitis of right shoulder 09/16/2023   Nontraumatic complete tear of right rotator cuff 09/16/2023   De Quervain's tenosynovitis, left 09/10/2021   Lateral epicondylitis, right elbow 09/10/2021   Allergy to environmental factors 10/07/2017    PCP: Lorren Greig PARAS, NP  REFERRING PROVIDER: Addie Cordella Hamilton, MD  THERAPY DIAG:  Right shoulder pain, unspecified chronicity  Muscle weakness  Localized edema  REFERRING DIAG: S/P shoulder surgery [Z98.890]   Rationale for Evaluation and Treatment:  Rehabilitation  SUBJECTIVE:  PERTINENT PAST HISTORY:  MDD, PTSD      PRECAUTIONS:   right shoulder rotator cuff repair biceps tenodesis and debridement for early adhesive capsulitis     OP Date: 02/07/2024  2 weeks 02/21/2024  4 weeks 03/06/2024  6 weeks 03/20/2024  8 weeks 04/03/2024  10 weeks 04/17/2024  12 weeks 05/01/2024   Protocol:  [email or web address]  WEIGHT BEARING RESTRICTIONS Yes    FALLS:  Has patient fallen in last 6 months? No, Number of falls: 0  MOI/History of condition:  Onset date: 8/5  SUBJECTIVE STATEMENT  05/01/2024:  Pt reports her R shoulder is a 3/10 pain.   Eval:  Pt is a 53 y.o. female who presents to clinic with chief complaint of R shoulder pain and stiffness following R/C repair, debridement, and biceps tenodesis.  She has generally followed  precautions but was not thinking an picked up her 35# grandchild yesterday and has been achy since.  Her pain is intermittent and achy.  Having some issue with ROM.  Previous history of AC.  Pain:  Are you having pain? Yes Pain location: R shoulder pain NPRS scale: 3/10  Best: 0/10, Worst: 5/10 Aggravating factors: stretching it out, texting, writing Relieving factors: rest Pain description: aching  Occupation: Licensed Conveyancer Device: na  Hand Dominance: R  Patient Goals/Specific Activities: reduce pain, be able to use R arm   OBJECTIVE:   DIAGNOSTIC FINDINGS:  NA  GENERAL OBSERVATION: Rounded shoulders     SENSATION: Light touch: Appears intact   UPPER EXTREMITY AROM:  ROM Right (Eval) Left (Eval) R 10/23  Shoulder flexion 70* 155 120  Shoulder abduction 30* significant shoulder hike 150   Shoulder internal rotation     Shoulder external rotation     Functional IR     Functional ER     Shoulder extension     Elbow extension     Elbow flexion      (Blank rows = not tested, N = WNL, * = concordant pain with testing)  UPPER EXTREMITY MMT:  MMT Right (Eval) Left (Eval)  Shoulder flexion    Shoulder abduction (C5)    Shoulder ER    Shoulder IR    Middle trapezius    Lower trapezius    Shoulder extension    Grip strength    Cervical flexion (C1,C2)    Cervical S/B (C3)    Shoulder shrug (C4)    Elbow flexion (C6)    Elbow ext (C7)    Thumb ext (C8)    Finger abd (T1)    Grossly     (Blank rows = not tested, score listed is out of 5 possible points.  N = WNL, D = diminished, C = clear for gross weakness with myotome testing, * = concordant pain with testing)   UPPER EXTREMITY PROM:  PROM Right (Eval) Left (Eval) Right 04/03/24 Right 04/05/24  R 10/9  Shoulder flexion 120  125 135 140 Supine  Shoulder abduction   100    Shoulder internal rotation 30      Shoulder external rotation 20 neutral  35    Functional IR       Functional ER        Shoulder extension       Elbow extension       Elbow flexion        (Blank rows = not tested, N = WNL, * = concordant pain with testing)  JOINT MOBILITY TESTING:  Hypomobile GH   PATIENT SURVEYS:  QuickDASH Score: 61.4 / 100 = 61.4 %    TODAY'S TREATMENT:  [facility] Adult PT Treatment:                                                DATE: 05/01/24 Therapeutic Exercise: UBE level 1  Blue TB row - 2x10 GTB shoulder ext - 2x10 wall slide flexion 10 x 2 Wall slide scaption 10 x 2  Standing shoulder IR RTB 10 x 2  Standing shoulder ER YTB x 15  Inclined shoulder flexion 1# 10 x 2  Manual Therapy: Jt mobs and PROM right shoulder      [facility] Adult PT Treatment:                                                DATE: 04/26/24 Therapeutic Exercise: Pball chest press Pball supine flexion Supine flexion AROM Supine ER IR - 1# Supine salute - 1# Blue TB row - 2x10 GTB shoulder ext - 2x10  Therapeutic Activity  Towel slide flexion W/ lift off Scaption 90-90 OH press  Manual Therapy: Jt mobs and PROM right shoulder   [facility] Adult PT Treatment:                                                DATE: 04/24/24 Therapeutic Exercise: Supine shoulder dowel flexion and scaption Right shoulder flexion 1# x 10  SL shoulder abduction 2 x 10  SL ER AROM 10 x 1, 1# 10 x 1  Inclined Shoulder flexion AROM 2 x 10     90-90 OH press to 90 degrees   Scaption wall slides 10 x 2  GTB rows 10 x 2  Pull downs GTB 10 x 2  IR red band 10 x 2  Updated HEP Manual Therapy: Jt mobs and PROM right shoulder      HOME EXERCISE PROGRAM: Access Code: [email or web address] URL: [email or web address] Date: 04/26/2024 Prepared by: Helene Gasmen  Exercises - Supine Shoulder External Rotation with Dowel  - 1 x daily - 7 x weekly - 2 sets - 10 reps - Supine Shoulder Flexion  Extension Full Range AROM  - 1 x daily - 7 x weekly - 1-2 sets - 8-10 reps - Sidelying Shoulder Abduction Palm Forward  - 1 x daily - 7 x weekly - 1-2 sets - 8-10 reps - Sidelying Shoulder External Rotation  - 1 x daily - 7 x weekly - 1-2 sets - 8-10 reps - Seated Overhead Press  - 1 x daily - 7 x weekly - 3 sets - 10 reps - Standing Shoulder Row with Anchored Resistance  - 1 x daily - 7 x weekly - 2 sets - 10 reps - Shoulder extension with resistance - Neutral  - 1 x daily - 7 x weekly - 2 sets - 10 reps - Shoulder External Rotation with Anchored Resistance  - 1 x daily - 7 x weekly - 2 sets - 10 reps - Standing Shoulder Internal Rotation with Anchored Resistance  - 1 x daily - 7 x weekly - 2 sets - 10 reps - Shoulder Flexion Wall Slide with Towel  - 1 x daily - 7 x weekly -  2 sets - 10 reps  Treatment priorities   Eval  10/2      ROM        Passive -> AAROM -> AROM as appropriate   Supine/SL AROM                                ASSESSMENT:  CLINICAL IMPRESSION:  05/01/2024:  Maria Blackburn tolerated session well with no adverse reaction. Doing well overall with addition of cuff strengthening.  Focused on AAROM to AROM progression today with good tolerance.      EVAL:  Maria Blackburn is a 53 y.o. female who presents to clinic with signs and sxs consistent with R shoulder stiffness and pain following biceps tenodesis, R/C repair, and biceps tenodesis on 8/5.   Fairly stiff with significantly more PROM vs AROM.   Maria Blackburn will benefit from skilled PT to address relevant deficits and improve shoulder function and comfort.   OBJECTIVE IMPAIRMENTS: Pain, R shoulder ROM, R shoulder strength  ACTIVITY LIMITATIONS: reaching, lifting, childcare, housework, driving, school work, typing  PERSONAL FACTORS: See medical history and pertinent history   REHAB POTENTIAL: Good  CLINICAL DECISION MAKING: Evolving/moderate complexity  EVALUATION COMPLEXITY: Moderate   GOALS:   SHORT TERM GOALS: Target  date: 04/17/2024   Maria Blackburn will be >75% HEP compliant to improve carryover between sessions and facilitate independent management of condition  Evaluation: ongoing Goal status: MET   LONG TERM GOALS: Target date: 05/15/2024   Maria Blackburn will self report >/= 50% decrease in pain from evaluation to improve function in daily tasks  Evaluation/Baseline: 10/10 max pain Goal status: INITIAL   2.  Maria Blackburn will show a >/= 30 pct improvement in her QUICK DASH score (MCID is 10% or ~5 pts) as a proxy for functional improvement   Evaluation/Baseline: QuickDASH Score: 61.4 / 100 = 61.4 % Goal status: INITIAL   3.  Maria Blackburn will be able to care for nephew, including lifting, not limited by pain  Evaluation/Baseline: limited Goal status: INITIAL   4.  Maria Blackburn will demonstrate >130 degrees of active ROM in flexion to allow completion of activities involving reaching OH, not limited by pain  Evaluation/Baseline: 70 degrees Goal status: INITIAL   5.  Maria Blackburn will be able to complete school work, not limited by pain  Evaluation/Baseline: limited Goal status: INITIAL   6.  Maria Blackburn will improve the following MMTs to >/= 4/5 to show improvement in strength:  flexion and ER   Evaluation/Baseline: not tested Goal status: INITIAL   PLAN: PT FREQUENCY: 1-2x/week  PT DURATION: 8 weeks  PLANNED INTERVENTIONS:  97164- PT Re-evaluation, 97110-Therapeutic exercises, 97530- Therapeutic activity, 97112- Neuromuscular re-education, 97535- Self Care, 02859- Manual therapy, [id number]- Gait training, [id number]- Aquatic Therapy, [phone]- Electrical stimulation (manual), [id number]- Vasopneumatic device, [id number]- Traction (mechanical), [id number]- Ionotophoresis 4mg /ml Dexamethasone , Taping, Dry Needling, Joint manipulation, and Spinal manipulation.  Harlene CHRISTELLA Persons PTA 05/01/24 3:29 PM Phone: [phone] Fax: [phone]   I just finished a MCD eval/recert.  Name: ISHANI GOLDWASSER  MRN: [id number] Please request 2x/week for 8  weeks.  Check all conditions that are expected to impact treatment: Musculoskeletal disorders   I DID put a charge in.  Check all possible CPT codes: 02889- Therapeutic Exercise, [phone]- Neuro Re-education, [phone] - Gait Training, [phone] - Manual Therapy, 97530 - Therapeutic Activities, 97535 - Self Care, [phone] - Re-evaluation, [id number] - Mechanical traction, and [id number] - Aquatic therapy   Thank you!  MCD -  Secure

## 2024-05-03 ENCOUNTER — Telehealth (HOSPITAL_COMMUNITY): Payer: MEDICAID

## 2024-05-03 ENCOUNTER — Ambulatory Visit: Payer: MEDICAID | Admitting: Physical Therapy

## 2024-05-03 ENCOUNTER — Other Ambulatory Visit (HOSPITAL_COMMUNITY): Payer: Self-pay

## 2024-05-03 DIAGNOSIS — F411 Generalized anxiety disorder: Secondary | ICD-10-CM | POA: Diagnosis not present

## 2024-05-03 DIAGNOSIS — F4310 Post-traumatic stress disorder, unspecified: Secondary | ICD-10-CM

## 2024-05-03 DIAGNOSIS — F333 Major depressive disorder, recurrent, severe with psychotic symptoms: Secondary | ICD-10-CM

## 2024-05-03 MED ORDER — ARIPIPRAZOLE 15 MG PO TABS
15.0000 mg | ORAL_TABLET | Freq: Every day | ORAL | 1 refills | Status: AC
Start: 2024-05-03 — End: ?

## 2024-05-03 MED ORDER — FLUOXETINE HCL 20 MG PO CAPS
20.0000 mg | ORAL_CAPSULE | Freq: Every day | ORAL | 1 refills | Status: AC
Start: 2024-05-03 — End: ?

## 2024-05-03 MED ORDER — TRAZODONE HCL 50 MG PO TABS: 50.0000 mg | ORAL_TABLET | Freq: Every day | ORAL | 1 refills | Status: DC

## 2024-05-03 MED ORDER — SERTRALINE HCL 100 MG PO TABS: ORAL_TABLET | ORAL | 0 refills | Status: DC

## 2024-05-07 ENCOUNTER — Encounter: Payer: Self-pay | Admitting: Radiology

## 2024-05-08 ENCOUNTER — Ambulatory Visit: Payer: MEDICAID | Attending: Orthopedic Surgery | Admitting: Physical Therapy

## 2024-05-08 ENCOUNTER — Encounter: Payer: Self-pay | Admitting: Physical Therapy

## 2024-05-08 DIAGNOSIS — R600 Localized edema: Secondary | ICD-10-CM | POA: Insufficient documentation

## 2024-05-08 DIAGNOSIS — M6281 Muscle weakness (generalized): Secondary | ICD-10-CM | POA: Diagnosis present

## 2024-05-08 DIAGNOSIS — M25511 Pain in right shoulder: Secondary | ICD-10-CM | POA: Diagnosis present

## 2024-05-08 NOTE — Therapy (Signed)
 OUTPATIENT PHYSICAL THERAPY SHOULDER TREATMENT   Patient Name: Maria Blackburn MRN: [id number] DOB:[birth year], 53 y.o., female Today's Date: 05/08/2024   PT End of Session - 05/08/24 1447     Visit Number 11    Number of Visits --   1-2x/week   Date for Recertification  05/15/24    Authorization Type [employer]  MCD    Authorization Time Period 03/20/24-06/19/24    Authorization - Visit Number 10    Authorization - Number of Visits 16    PT Start Time 0200    PT Stop Time 0245    PT Time Calculation (min) 45 min            Past Medical History:  Diagnosis Date   Anxiety    Bladder spasms    Frequency of urination    GERD (gastroesophageal reflux disease)    Interstitial cystitis    Pelvic pain    SUI (stress urinary incontinence, female)    Urgency of urination    Past Surgical History:  Procedure Laterality Date   ABDOMINAL HYSTERECTOMY  2000   ovaries remain   BICEPT TENODESIS Right 02/07/2024   Procedure: TENODESIS, BICEPS, RIGHT;  Surgeon: Addie Cordella Hamilton, MD;  Location: [facility] OR;  Service: Orthopedics;  Laterality: Right;   CYSTO WITH HYDRODISTENSION N/A 03/24/2017   Procedure: CYSTOSCOPY/HYDRODISTENSION INSTILLATION OF MARCAINE  AND PYRIDIUM ;  Surgeon: Watt Rush, MD;  Location: [facility];  Service: Urology;  Laterality: N/A;   HYSTEROSCOPY WITH D & C  1999   POLYPECTOMY   POSTERIOR LUMBAR FUSION 2 WITH HARDWARE REMOVAL Right 02/07/2024   Procedure: ARTHROSCOPY, SHOULDER WITH DEBRIDEMENT, RIGHT;  Surgeon: Addie Cordella Hamilton, MD;  Location: [facility] OR;  Service: Orthopedics;  Laterality: Right;  right shoulder arthroscopy, debridement, biceps tenodesis, mini open rotator cuff tear repair   SHOULDER OPEN ROTATOR CUFF REPAIR Right 02/07/2024   Procedure: REPAIR, ROTATOR CUFF, OPEN, RIGHT;  Surgeon: Addie Cordella Hamilton, MD;  Location: [facility] OR;  Service: Orthopedics;  Laterality: Right;   TUBAL LIGATION Bilateral 1996   Patient Active Problem List    Diagnosis Date Noted   Degenerative superior labral anterior-to-posterior (SLAP) tear of right shoulder 02/26/2024   Synovitis of right shoulder 02/26/2024   Biceps tendonitis, right 02/26/2024   Major depressive disorder, recurrent episode, moderate (HCC) 02/21/2024   PTSD (post-traumatic stress disorder) 10/22/2023   MDD (major depressive disorder), recurrent, severe, with psychosis (HCC) 10/21/2023   Acute psychosis (HCC) 09/29/2023   GAD (generalized anxiety disorder) 09/29/2023   Adhesive capsulitis of right shoulder 09/16/2023   Nontraumatic complete tear of right rotator cuff 09/16/2023   De Quervain's tenosynovitis, left 09/10/2021   Lateral epicondylitis, right elbow 09/10/2021   Allergy to environmental factors 10/07/2017    PCP: Lorren Greig PARAS, NP  REFERRING PROVIDER: Addie Cordella Hamilton, MD  THERAPY DIAG:  Right shoulder pain, unspecified chronicity  Muscle weakness  Localized edema  REFERRING DIAG: S/P shoulder surgery [Z98.890]   Rationale for Evaluation and Treatment:  Rehabilitation  SUBJECTIVE:  PERTINENT PAST HISTORY:  MDD, PTSD      PRECAUTIONS:   right shoulder rotator cuff repair biceps tenodesis and debridement for early adhesive capsulitis     OP Date: 02/07/2024  2 weeks 02/21/2024  4 weeks 03/06/2024  6 weeks 03/20/2024  8 weeks 04/03/2024  10 weeks 04/17/2024  12 weeks 05/01/2024   Protocol:  [email or web address]  WEIGHT BEARING RESTRICTIONS Yes    FALLS:  Has patient fallen in last 6 months? No,  Number of falls: 0  MOI/History of condition:  Onset date: 8/5  SUBJECTIVE STATEMENT  05/08/2024:  Pt reports her R shoulder is a 3/10 pain.   Eval:  Pt is a 53 y.o. female who presents to clinic with chief complaint of R shoulder pain and stiffness following R/C repair, debridement, and biceps tenodesis.   She has generally followed precautions but was not thinking an picked up her 35# grandchild yesterday and has been achy since.  Her pain is intermittent and achy.  Having some issue with ROM.  Previous history of AC.  Pain:  Are you having pain? Yes Pain location: R shoulder pain NPRS scale: 3/10  Best: 0/10, Worst: 5/10 Aggravating factors: stretching it out, texting, writing Relieving factors: rest Pain description: aching  Occupation: Licensed Conveyancer Device: na  Hand Dominance: R  Patient Goals/Specific Activities: reduce pain, be able to use R arm   OBJECTIVE:   DIAGNOSTIC FINDINGS:  NA  GENERAL OBSERVATION: Rounded shoulders     SENSATION: Light touch: Appears intact   UPPER EXTREMITY AROM:  ROM Right (Eval) Left (Eval) R 10/23  Shoulder flexion 70* 155 120  Shoulder abduction 30* significant shoulder hike 150   Shoulder internal rotation     Shoulder external rotation     Functional IR     Functional ER     Shoulder extension     Elbow extension     Elbow flexion      (Blank rows = not tested, N = WNL, * = concordant pain with testing)  UPPER EXTREMITY MMT:  MMT Right (Eval) Left (Eval)  Shoulder flexion    Shoulder abduction (C5)    Shoulder ER    Shoulder IR    Middle trapezius    Lower trapezius    Shoulder extension    Grip strength    Cervical flexion (C1,C2)    Cervical S/B (C3)    Shoulder shrug (C4)    Elbow flexion (C6)    Elbow ext (C7)    Thumb ext (C8)    Finger abd (T1)    Grossly     (Blank rows = not tested, score listed is out of 5 possible points.  N = WNL, D = diminished, C = clear for gross weakness with myotome testing, * = concordant pain with testing)   UPPER EXTREMITY PROM:  PROM Right (Eval) Left (Eval) Right 04/03/24 Right 04/05/24  R 10/9  Shoulder flexion 120  125 135 140 Supine  Shoulder abduction   100    Shoulder internal rotation 30      Shoulder external rotation 20 neutral  35    Functional  IR       Functional ER       Shoulder extension       Elbow extension       Elbow flexion        (Blank rows = not tested, N = WNL, * = concordant pain with testing)  JOINT MOBILITY TESTING:  Hypomobile GH   PATIENT SURVEYS:  QuickDASH Score: 61.4 / 100 = 61.4 %    TODAY'S TREATMENT:  [facility] Adult PT Treatment:                                                DATE: 05/08/24 Therapeutic Exercise: UBE level 1 3 min each way  Black TB row - 2x10 BTB shoulder ext - 2x10 Standing shoulder IR GTB 10 x 3 Standing shoulder ER GTB 10 x 3  Supine GTB horiz abdct Supine GTB diagonals right  Manual Therapy: Jt mobs and PROM right shoulder      [facility] Adult PT Treatment:                                                DATE: 05/01/24 Therapeutic Exercise: UBE level 1  Blue TB row - 2x10 GTB shoulder ext - 2x10 wall slide flexion 10 x 2 Wall slide scaption 10 x 2  Standing shoulder IR RTB 10 x 2  Standing shoulder ER YTB x 15  Inclined shoulder flexion 1# 10 x 2  Manual Therapy: Jt mobs and PROM right shoulder      [facility] Adult PT Treatment:                                                DATE: 04/26/24 Therapeutic Exercise: Pball chest press Pball supine flexion Supine flexion AROM Supine ER IR - 1# Supine salute - 1# Blue TB row - 2x10 GTB shoulder ext - 2x10  Therapeutic Activity  Towel slide flexion W/ lift off Scaption 90-90 OH press  Manual Therapy: Jt mobs and PROM right shoulder   [facility] Adult PT Treatment:                                                DATE: 04/24/24 Therapeutic Exercise: Supine shoulder dowel flexion and scaption Right shoulder flexion 1# x 10  SL shoulder abduction 2 x 10  SL ER AROM 10 x 1, 1# 10 x 1  Inclined Shoulder flexion AROM 2 x 10     90-90 OH press to 90 degrees   Scaption wall slides 10 x 2  GTB rows 10 x 2   Pull downs GTB 10 x 2  IR red band 10 x 2  Updated HEP Manual Therapy: Jt mobs and PROM right shoulder      HOME EXERCISE PROGRAM: Access Code: [email or web address] URL: [email or web address] Date: 04/26/2024 Prepared by: Helene Gasmen  Exercises - Supine Shoulder External Rotation with Dowel  - 1 x daily - 7 x weekly - 2 sets - 10 reps - Supine Shoulder Flexion Extension Full Range AROM  - 1 x daily - 7 x weekly - 1-2 sets - 8-10 reps - Sidelying Shoulder Abduction Palm Forward  - 1 x daily - 7 x weekly - 1-2 sets - 8-10 reps - Sidelying Shoulder External Rotation  - 1 x daily - 7 x weekly - 1-2 sets - 8-10 reps - Seated Overhead Press  - 1 x daily - 7 x weekly -  3 sets - 10 reps - Standing Shoulder Row with Anchored Resistance  - 1 x daily - 7 x weekly - 2 sets - 10 reps - Shoulder extension with resistance - Neutral  - 1 x daily - 7 x weekly - 2 sets - 10 reps - Shoulder External Rotation with Anchored Resistance  - 1 x daily - 7 x weekly - 2 sets - 10 reps - Standing Shoulder Internal Rotation with Anchored Resistance  - 1 x daily - 7 x weekly - 2 sets - 10 reps - Shoulder Flexion Wall Slide with Towel  - 1 x daily - 7 x weekly - 2 sets - 10 reps  Treatment priorities   Eval  10/2      ROM        Passive -> AAROM -> AROM as appropriate   Supine/SL AROM                                ASSESSMENT:  CLINICAL IMPRESSION:  05/08/2024:  Maria Blackburn tolerated session well with no adverse reaction. Doing well overall with addition of cuff strengthening and scap/ shoulder strength.  Focused on AAROM to AROM progression today with good tolerance.  Still some stiffness with end range shoulder movements and scapular hike is present.     EVAL:  Maria Blackburn is a 53 y.o. female who presents to clinic with signs and sxs consistent with R shoulder stiffness and pain following biceps tenodesis, R/C repair, and biceps tenodesis on 8/5.   Fairly stiff with significantly more PROM vs AROM.    Maria Blackburn will benefit from skilled PT to address relevant deficits and improve shoulder function and comfort.   OBJECTIVE IMPAIRMENTS: Pain, R shoulder ROM, R shoulder strength  ACTIVITY LIMITATIONS: reaching, lifting, childcare, housework, driving, school work, typing  PERSONAL FACTORS: See medical history and pertinent history   REHAB POTENTIAL: Good  CLINICAL DECISION MAKING: Evolving/moderate complexity  EVALUATION COMPLEXITY: Moderate   GOALS:   SHORT TERM GOALS: Target date: 04/17/2024   Maria Blackburn will be >75% HEP compliant to improve carryover between sessions and facilitate independent management of condition  Evaluation: ongoing Goal status: MET   LONG TERM GOALS: Target date: 05/15/2024   Maria Blackburn will self report >/= 50% decrease in pain from evaluation to improve function in daily tasks  Evaluation/Baseline: 10/10 max pain Goal status: INITIAL   2.  Maria Blackburn will show a >/= 30 pct improvement in her QUICK DASH score (MCID is 10% or ~5 pts) as a proxy for functional improvement   Evaluation/Baseline: QuickDASH Score: 61.4 / 100 = 61.4 % Goal status: INITIAL   3.  Maria Blackburn will be able to care for nephew, including lifting, not limited by pain  Evaluation/Baseline: limited Goal status: INITIAL   4.  Maria Blackburn will demonstrate >130 degrees of active ROM in flexion to allow completion of activities involving reaching OH, not limited by pain  Evaluation/Baseline: 70 degrees Goal status: INITIAL   5.  Maria Blackburn will be able to complete school work, not limited by pain  Evaluation/Baseline: limited Goal status: INITIAL   6.  Maria Blackburn will improve the following MMTs to >/= 4/5 to show improvement in strength:  flexion and ER   Evaluation/Baseline: not tested Goal status: INITIAL   PLAN: PT FREQUENCY: 1-2x/week  PT DURATION: 8 weeks  PLANNED INTERVENTIONS:  97164- PT Re-evaluation, 97110-Therapeutic exercises, 97530- Therapeutic activity, [id number]- Neuromuscular  re-education, 97535- Self Care, 02859- Manual therapy, 02883-  Gait training, 02886- Aquatic Therapy, [id number]- Electrical stimulation (manual), [id number]- Vasopneumatic device, [id number]- Traction (mechanical), [id number]- Ionotophoresis 4mg /ml Dexamethasone , Taping, Dry Needling, Joint manipulation, and Spinal manipulation.  Maria Blackburn PTA 05/08/24 2:48 PM Phone: [phone] Fax: [phone]   I just finished a MCD eval/recert.  Name: Maria Blackburn  MRN: [id number] Please request 2x/week for 8 weeks.  Check all conditions that are expected to impact treatment: Musculoskeletal disorders   I DID put a charge in.  Check all possible CPT codes: 02889- Therapeutic Exercise, [phone]- Neuro Re-education, [phone] - Gait Training, [phone] - Manual Therapy, 97530 - Therapeutic Activities, 97535 - Self Care, [phone] - Re-evaluation, [id number] - Mechanical traction, and [id number] - Aquatic therapy   Thank you!  MCD - Secure

## 2024-05-10 ENCOUNTER — Ambulatory Visit: Payer: MEDICAID | Admitting: Physical Therapy

## 2024-05-14 ENCOUNTER — Ambulatory Visit: Payer: MEDICAID | Admitting: Physical Therapy

## 2024-05-15 ENCOUNTER — Ambulatory Visit: Payer: MEDICAID | Admitting: Physical Therapy

## 2024-05-24 ENCOUNTER — Ambulatory Visit (HOSPITAL_COMMUNITY)
Admission: RE | Admit: 2024-05-24 | Discharge: 2024-05-24 | Disposition: A | Discharge status: Home / Self Care | Payer: MEDICAID | Source: Ambulatory Visit | Attending: Internal Medicine | Admitting: Internal Medicine

## 2024-05-24 DIAGNOSIS — R072 Precordial pain: Secondary | ICD-10-CM | POA: Insufficient documentation

## 2024-05-24 LAB — ECHOCARDIOGRAM COMPLETE
AR max vel: 2.26 cm2
AV Area VTI: 2.24 cm2
AV Area mean vel: 2.12 cm2
AV Mean grad: 2 mmHg
AV Peak grad: 3.4 mmHg
Ao pk vel: 0.93 m/s
Area-P 1/2: 4.19 cm2
S' Lateral: 3.2 cm

## 2024-05-24 MED ORDER — PERFLUTREN LIPID MICROSPHERE
1.0000 mL | INTRAVENOUS | Status: AC | PRN
Start: 2024-05-24 — End: 2024-05-24
  Administered 2024-05-24: 5 mL via INTRAVENOUS

## 2024-06-14 ENCOUNTER — Telehealth (INDEPENDENT_AMBULATORY_CARE_PROVIDER_SITE_OTHER): Payer: MEDICAID

## 2024-06-14 DIAGNOSIS — F331 Major depressive disorder, recurrent, moderate: Secondary | ICD-10-CM

## 2024-06-14 DIAGNOSIS — F411 Generalized anxiety disorder: Secondary | ICD-10-CM

## 2024-06-14 DIAGNOSIS — F4310 Post-traumatic stress disorder, unspecified: Secondary | ICD-10-CM

## 2024-06-14 MED ORDER — ARIPIPRAZOLE 15 MG PO TABS: 15.0000 mg | ORAL_TABLET | Freq: Every day | ORAL | 3 refills | Status: AC

## 2024-06-14 MED ORDER — TRAZODONE HCL 50 MG PO TABS: 50.0000 mg | ORAL_TABLET | Freq: Every day | ORAL | 3 refills | Status: AC

## 2024-06-14 MED ORDER — FLUOXETINE HCL 20 MG PO CAPS: 20.0000 mg | ORAL_CAPSULE | Freq: Every day | ORAL | 3 refills | Status: AC

## 2024-06-14 NOTE — Progress Notes (Addendum)
 BH MD Outpatient Progress Note  Date of visit: 12/22/2023 Maria Blackburn  MRN:  [id number]  Televisit via video: I connected with patient on 06/14/24 at  10:00 AM EDT by a video enabled telemedicine application and verified that I am speaking with the correct person using two identifiers.  Location: Patient: Home Provider: Clinic   I discussed the limitations of evaluation and management by telemedicine and the availability of in person appointments. The patient expressed understanding and agreed to proceed.  I discussed the assessment and treatment plan with the patient. The patient was provided an opportunity to ask questions and all were answered. The patient agreed with the plan and demonstrated an understanding of the instructions.   The patient was advised to call back or seek an in-person evaluation if the symptoms worsen or if the condition fails to improve as anticipated.  Assessment:  Maria Blackburn presents for follow-up evaluation.  She has continued to remain stable on the current dose of medications and has had no exacerbations in her depression or anxiety.  Auditory and visual hallucinations have improved on the current therapeutic regimen.  She has remained compliant with the medications, has had no side effects or any new physical concerns.  She has been eating and sleeping well without any nightmares, flashbacks, has had no active or passive SI/HI/AVH.  He has been taking care of her nephew, finishing up a history degree and planning to work in the future.  She is not smoking cigarettes, using alcohol or using any other substances.  She does have good insight into her condition.  She has tolerated the discontinuation of Zoloft  and increased dose of Prozac  well.  Plan to continue the same medication management and have her back in the clinic in 12 weeks.  Identifying Information: Maria Blackburn is a 53 year old female with a history of MDD with psychotic features who is an  established patient with [facility] for management of auditory hallucinations and recent psychiatric hospitalizations.   Plan:  # MDD with psychotic features Interventions: -- Continue Abilify  15 mg daily - Continue trazodone  50 mg nightly - Zoloft  discontinued -- Continue Prozac  20 mg daily for mood  #Long term use of antipsychotic medication -- Lipid panel from 10/2023 - A1c from 10/2023 - See discussion of QT interval below and EKGs - No tardive movements noted on assessment today - No weight gain on Abilify  so far  # Prolonged QT interval - Noted on EKG from 4/21 at the emergency department.  QTc B noted to be in the high 600s.  Correction with alternative formula also yields a QTc in the 600s. - Previous EKGs reviewed.  EKG from 4/20 unremarkable.  EKG from 4/18 with QTc of 499.  EKG from 3/27, unremarkable. - Overall there is some concern for prolonged QT, though I do not think it is likely a QTc of 600 will be demonstrated on subsequent EKGs. - Primary care provider referred to cardiology.  It appears to still be pending.  I gave the patient the phone number to reach out to this clinic.   Patient was given contact information for behavioral health clinic and was instructed to call 911 for emergencies.   Subjective:  Chief Complaint:  Chief Complaint  Patient presents with   Follow-up   Medication Refill   Depression   Anxiety    Interval History:  Today, patient was seen via televisit appointment.  She reported her mood as pretty good .  She  denied any active or passive SI/HI/AVH.  She stated that my mood has been fine always .  She reported good sleep, denied any nightmares or flashbacks and good appetite.  When asked about depression she stated going good , and denied feeling anxious, denied any panic attacks.  Reported that she has continued to spend time with her nephew who has been sick lately, has been cooking and enjoys music.  She also  reported that she reported [employer] and is looking forward for the holidays ahead.  She denied smoking any cigarettes, drinking alcohol or using any other substances.  She reported compliance to all the medications, denied any physical concerns or side effects.  She denied any changes in her mood or any side effects after taking her Zoloft  or increasing fluoxetine .  We discussed about continuing the same medications for now.  Discussed the risk benefits and side effects.  Encouraged her to call the clinic if she needed an earlier appointment, plan to have her back in the clinic in 12 weeks.   Visit Diagnosis:    ICD-10-CM   1. MDD (major depressive disorder), recurrent episode, moderate (HCC)  F33.1 FLUoxetine  (PROZAC ) 20 MG capsule    ARIPiprazole  (ABILIFY ) 15 MG tablet    traZODone  (DESYREL ) 50 MG tablet    2. GAD (generalized anxiety disorder)  F41.1 FLUoxetine  (PROZAC ) 20 MG capsule    traZODone  (DESYREL ) 50 MG tablet    3. PTSD (post-traumatic stress disorder)  F43.10 traZODone  (DESYREL ) 50 MG tablet         Past Psychiatric History: None prior to psychiatric hospitalizations  Past Medical History:  Past Medical History:  Diagnosis Date   Anxiety    Bladder spasms    Frequency of urination    GERD (gastroesophageal reflux disease)    Interstitial cystitis    Pelvic pain    SUI (stress urinary incontinence, female)    Urgency of urination     Past Surgical History:  Procedure Laterality Date   ABDOMINAL HYSTERECTOMY  2000   ovaries remain   BICEPT TENODESIS Right 02/07/2024   Procedure: TENODESIS, BICEPS, RIGHT;  Surgeon: Addie Cordella Hamilton, MD;  Location: [facility] OR;  Service: Orthopedics;  Laterality: Right;   CYSTO WITH HYDRODISTENSION N/A 03/24/2017   Procedure: CYSTOSCOPY/HYDRODISTENSION INSTILLATION OF MARCAINE  AND PYRIDIUM ;  Surgeon: Watt Rush, MD;  Location: [facility];  Service: Urology;  Laterality: N/A;   HYSTEROSCOPY WITH D & C  1999   POLYPECTOMY    POSTERIOR LUMBAR FUSION 2 WITH HARDWARE REMOVAL Right 02/07/2024   Procedure: ARTHROSCOPY, SHOULDER WITH DEBRIDEMENT, RIGHT;  Surgeon: Addie Cordella Hamilton, MD;  Location: [facility] OR;  Service: Orthopedics;  Laterality: Right;  right shoulder arthroscopy, debridement, biceps tenodesis, mini open rotator cuff tear repair   SHOULDER OPEN ROTATOR CUFF REPAIR Right 02/07/2024   Procedure: REPAIR, ROTATOR CUFF, OPEN, RIGHT;  Surgeon: Addie Cordella Hamilton, MD;  Location: [facility] OR;  Service: Orthopedics;  Laterality: Right;   TUBAL LIGATION Bilateral 1996    Family Psychiatric History: None pertinent  Family History:  Family History  Problem Relation Age of Onset   Heart disease Mother    Diabetes Son     Social History:  Social History   Socioeconomic History   Marital status: Married    Spouse name: Not on file   Number of children: Not on file   Years of education: Not on file   Highest education level: Not on file  Occupational History   Not on file  Tobacco Use   Smoking status: Never   Smokeless tobacco: Never  Vaping Use   Vaping status: Never Used  Substance and Sexual Activity   Alcohol use: No   Drug use: No   Sexual activity: Yes  Other Topics Concern   Not on file  Social History Narrative   Not on file   Social Drivers of Health   Tobacco Use: Low Risk (05/08/2024)   Patient History    Smoking Tobacco Use: Never    Smokeless Tobacco Use: Never    Passive Exposure: Not on file  Financial Resource Strain: Low Risk (03/25/2023)   Overall Financial Resource Strain (CARDIA)    Difficulty of Paying Living Expenses: Not very hard  Food Insecurity: No Food Insecurity (10/21/2023)   Hunger Vital Sign    Worried About Running Out of Food in the Last Year: Never true    Ran Out of Food in the Last Year: Never true  Transportation Needs: No Transportation Needs (10/21/2023)   PRAPARE - Administrator, Civil Service (Medical): No    Lack of Transportation (Non-Medical):  No  Physical Activity: Patient Unable To Answer (03/25/2023)   Exercise Vital Sign    Days of Exercise per Week: Patient unable to answer    Minutes of Exercise per Session: Patient unable to answer  Stress: Patient Unable To Answer (03/25/2023)   [employer] of Occupational Health - Occupational Stress Questionnaire    Feeling of Stress : Patient unable to answer  Social Connections: Socially Integrated (10/21/2023)   Social Connection and Isolation Panel    Frequency of Communication with Friends and Family: More than three times a week    Frequency of Social Gatherings with Friends and Family: More than three times a week    Attends Religious Services: More than 4 times per year    Active Member of Clubs or Organizations: Yes    Attends Banker Meetings: More than 4 times per year    Marital Status: Married  Depression (PHQ2-9): Low Risk (04/30/2024)   Depression (PHQ2-9)    PHQ-2 Score: 0  Alcohol Screen: Low Risk (10/21/2023)   Alcohol Screen    Last Alcohol Screening Score (AUDIT): 0  Housing: Low Risk (10/21/2023)   Housing Stability Vital Sign    Unable to Pay for Housing in the Last Year: No    Number of Times Moved in the Last Year: 0    Homeless in the Last Year: No  Utilities: Not At Risk (10/21/2023)   AHC Utilities    Threatened with loss of utilities: No  Health Literacy: Adequate Health Literacy (03/25/2023)   B1300 Health Literacy    Frequency of need for help with medical instructions: Never    Allergies: No Known Allergies  Current Medications: Current Outpatient Medications  Medication Sig Dispense Refill   acetaminophen  (TYLENOL ) 500 MG tablet Take 1,000 mg by mouth every 6 (six) hours as needed for mild pain (pain score 1-3).     ARIPiprazole  (ABILIFY ) 15 MG tablet Take 1 tablet (15 mg total) by mouth daily. 30 tablet 3   atorvastatin  (LIPITOR) 20 MG tablet Take 1 tablet (20 mg total) by mouth daily. 30 tablet 0   celecoxib  (CELEBREX ) 100  MG capsule Take 1 capsule (100 mg total) by mouth daily. 60 capsule 0   Cyanocobalamin  (VITAMIN B-12) 5000 MCG TBDP Take 500 mcg by mouth daily.     FLUoxetine  (PROZAC ) 20 MG capsule Take 1 capsule (20 mg total)  by mouth daily. Take as prescribed 30 capsule 3   fluticasone  (FLONASE ) 50 MCG/ACT nasal spray Place 2 sprays into both nostrils daily. 16 g 6   loratadine  (CLARITIN ) 10 MG tablet Take 1 tablet (10 mg total) by mouth daily. 30 tablet 11   metoprolol tartrate (LOPRESSOR) 100 MG tablet Take 1 tablet (100 mg total) by mouth once for 1 dose. Take 90-120 minutes prior to scan. 1 tablet 0   solifenacin  (VESICARE ) 10 MG tablet Take 1 tablet (10 mg total) by mouth daily. 30 tablet 11   traZODone  (DESYREL ) 50 MG tablet Take 1 tablet (50 mg total) by mouth at bedtime. 30 tablet 3   No current facility-administered medications for this visit.     Objective:  Psychiatric Specialty Exam: Physical Exam Constitutional:      Appearance: the patient is not toxic-appearing.  Pulmonary:     Effort: Pulmonary effort is normal.  Neurological:     General: No focal deficit present.     Mental Status: the patient is alert and oriented to person, place, and time.   Review of Systems  Respiratory:  Negative for shortness of breath.   Cardiovascular:  Negative for chest pain.  Gastrointestinal:  Negative for abdominal pain, constipation, diarrhea, nausea and vomiting.  Neurological:  Negative for headaches.      There were no vitals taken for this visit.  General Appearance: Fairly Groomed  Eye Contact:  Good  Speech:  Clear and Coherent  Volume:  Normal  Mood:  Euthymic  Affect:  Congruent  Thought Process:  Coherent  Orientation:  Full (Time, Place, and Person)  Thought Content: Logical   Suicidal Thoughts:  No  Homicidal Thoughts:  No  Memory:  Immediate;   Good  Judgement:  fair  Insight:  fair  Psychomotor Activity:  Normal  Concentration:  Concentration: Good  Recall:  Good   Fund of Knowledge: Good  Language: Good  Akathisia:  No  Handed:    AIMS (if indicated): not done  Assets:  Communication Skills Desire for Improvement Financial Resources/Insurance Housing Leisure Time Physical Health  ADL's:  Intact  Cognition: WNL       Metabolic Disorder Labs: Lab Results  Component Value Date   HGBA1C 5.9 (H) 07/07/2023   No results found for: PROLACTIN Lab Results  Component Value Date   CHOL 247 (H) 08/12/2023   TRIG 135 08/12/2023   HDL 54 08/12/2023   CHOLHDL 4.6 (H) 08/12/2023   VLDL 18 03/20/2016   LDLCALC 169 (H) 08/12/2023   LDLCALC 147 (H) 07/07/2023   Lab Results  Component Value Date   TSH 0.964 10/05/2023   TSH 0.262 (L) 09/29/2023    Therapeutic Level Labs: No results found for: LITHIUM No results found for: VALPROATE No results found for: CBMZ  Screenings: AUDIT    Flowsheet Row [facility]  Alcohol Use Disorder Identification Test Final Score (AUDIT) 0 0   GAD-7    Flowsheet Row Office Visit from 11/04/2023 in [facility] Office Visit from 10/31/2023 in [facility] Office Visit from 02/02/2023 in [facility]  Total GAD-7 Score 0 3 1   PHQ2-9    Flowsheet Row Office Visit from 04/30/2024 in [facility] Video Visit from 03/29/2024 in [facility] Office Visit  from 11/04/2023 in [facility] Office Visit from 10/31/2023 in [facility] ED from 09/29/2023 in [facility] at [facility]  PHQ-2 Total Score 0 0 0 1 2  PHQ-9 Total Score -- -- 3 4 9    Flowsheet Row Admission (Discharged) from 02/07/2024 in [facility]  Office Visit from 10/31/2023 in [facility] Admission (Discharged[facility]  C-SSRS RISK CATEGORY No Risk No Risk No Risk    Collaboration of Care: none  A total of 30 minutes was spent involved in face to face clinical care, chart review, documentation.   Genita Nilsson, MD 06/14/2024, 11:08 AM

## 2024-06-18 ENCOUNTER — Encounter: Payer: MEDICAID | Admitting: Orthopedic Surgery

## 2024-06-19 ENCOUNTER — Encounter (HOSPITAL_COMMUNITY): Payer: Self-pay

## 2024-07-03 ENCOUNTER — Encounter (HOSPITAL_COMMUNITY): Payer: Self-pay

## 2024-07-06 ENCOUNTER — Ambulatory Visit (HOSPITAL_COMMUNITY): Payer: MEDICAID

## 2024-07-10 ENCOUNTER — Encounter (HOSPITAL_COMMUNITY): Payer: Self-pay | Admitting: Orthopedic Surgery

## 2024-07-11 ENCOUNTER — Other Ambulatory Visit: Payer: Self-pay

## 2024-07-11 ENCOUNTER — Other Ambulatory Visit: Payer: Self-pay | Admitting: Internal Medicine

## 2024-07-11 DIAGNOSIS — R072 Precordial pain: Secondary | ICD-10-CM

## 2024-07-12 ENCOUNTER — Telehealth (HOSPITAL_COMMUNITY): Payer: Self-pay | Admitting: *Deleted

## 2024-07-12 NOTE — Telephone Encounter (Signed)
 Left message on voicemail per DPR in reference to upcoming appointment scheduled on 07/16/2024 at 7:30 with detailed instructions given per Myocardial Perfusion Study Information Sheet for the test. LM to arrive 15 minutes early, and that it is imperative to arrive on time for appointment to keep from having the test rescheduled. If you need to cancel or reschedule your appointment, please call the office within 24 hours of your appointment. Failure to do so may result in a cancellation of your appointment, and a $50 no show fee. Phone number given for call back for any questions.

## 2024-07-16 ENCOUNTER — Ambulatory Visit: Payer: Self-pay | Admitting: Internal Medicine

## 2024-07-16 ENCOUNTER — Ambulatory Visit (HOSPITAL_COMMUNITY)
Admission: RE | Admit: 2024-07-16 | Discharge: 2024-07-16 | Disposition: A | Discharge status: Home / Self Care | Payer: MEDICAID | Source: Ambulatory Visit | Attending: Internal Medicine | Admitting: Internal Medicine

## 2024-07-16 DIAGNOSIS — R072 Precordial pain: Secondary | ICD-10-CM | POA: Insufficient documentation

## 2024-07-16 LAB — MYOCARDIAL PERFUSION IMAGING
Angina Index: 0
Estimated workload: 7.5
Exercise duration (min): 6 min
Exercise duration (sec): 40 s
LV dias vol: 72 mL (ref 46–106)
LV sys vol: 22 mL
MPHR: 141 {beats}/min
Nuc Stress EF: 71 %
Peak HR: 155 {beats}/min
Percent HR: 93 %
Rest HR: 72 {beats}/min
Rest Nuclear Isotope Dose: 10.8 mCi
SDS: 1
SRS: 0
SSS: 1
Stress Nuclear Isotope Dose: 29.3 mCi
TID: 0.71

## 2024-07-16 MED ORDER — TECHNETIUM TC 99M TETROFOSMIN IV KIT
10.8000 | PACK | Freq: Once | INTRAVENOUS | Status: AC | PRN
  Administered 2024-07-16: 10.8 via INTRAVENOUS

## 2024-07-16 MED ORDER — REGADENOSON 0.4 MG/5ML IV SOLN: 0.4000 mg | Freq: Once | INTRAVENOUS | Status: DC

## 2024-07-16 MED ORDER — TECHNETIUM TC 99M TETROFOSMIN IV KIT
29.3000 | PACK | Freq: Once | INTRAVENOUS | Status: AC | PRN
  Administered 2024-07-16: 29.3 via INTRAVENOUS

## 2024-07-16 NOTE — Progress Notes (Unsigned)
 "  Cardiology Office Note:   Date:  07/19/2024  ID:  Maria Blackburn, DOB 03-22-1971, MRN [id number] PCP:  Maria Greig PARAS, NP  [facility] Providers Cardiologist:  [name], MD Referring MD: Maria Greig PARAS, NP  Chief Complaint/Reason for Referral: QT prolongation ASSESSMENT:    1. Angina pectoris   2. Prolonged QT interval      PLAN:   In order of problems listed above: Angina pectoris: The patient on occasion has episodes of chest discomfort when she exerts herself.  She had an episode while she was sitting.  She is unclear whether this was related to food intake or not.  Her symptoms are not occurring frequently.  I will start her on Imdur  15 mg at bedtime, Toprol  12.5 mg at bedtime, as needed nitroglycerin , and a PPI.  We discussed ED precautions.  I will see her back in 1 month or earlier if needed. Prolonged QT interval.  EKG last visit demonstrated normalization of QT interval.  Repeat EKG this visit demonstrated normal QT interval.  The patient is on multiple medications that can prolong QT.  These include fluoxetine  that is associated with moderate risk for QT prolongation.  Sertraline  which has a low to moderate risk, and Abilify  which has a low risk.  Solifenacin  also has been associated with QT prolongation.  Her other medications are not associated with QT prolongation.              Dispo:  Return in about 4 weeks (around 08/16/2024).       I spent 42 minutes reviewing all clinical data during and prior to this visit including all relevant imaging studies, laboratories, clinical information from other health systems and prior notes from both Cardiology and other specialties, interviewing the patient, conducting a complete physical examination, and coordinating care in order to formulate a comprehensive and personalized evaluation and treatment plan.   History of Present Illness:    FOCUSED PROBLEM LIST:   Chest pain syndrome Low risk exercise Lexiscan  with  diaphragmatic attenuation versus mild inferior wall ischemia; EF 65% Generalized anxiety disorder Major depressive disorder with psychosis PTSD  October 2025:  Patient consents to use of AI scribe. The patient is 54 year old female with above listed medical problems referred for recommendations regarding QT prolongation seen on her EKG.  Patient had an EKG done on October 21, 2023 which demonstrated prolonged QT with a QTc of 545 ms.  She was referred to cardiology for this issue.  An EKG done on October 23, 2023 demonstrated normalization of QT interval with a QTc of 444 ms.  Approximately two and a half weeks ago, she experienced an episode of chest discomfort that began after swallowing her medication, with pain radiating from her chest down her right arm. This discomfort is atypical for her, as it usually affects her left arm. The episode lasted about ten minutes and was described as 'really discomfort' that sometimes causes arm numbness. These episodes occur infrequently, not even weekly, and sometimes happen without any apparent trigger.  She denies experiencing chest discomfort while walking. No swelling in her legs is noted. She reports a weight gain of fifteen to twenty pounds, which she feels has not been helpful.  Her family history is significant for heart problems, as her mother has had numerous heart issues. She is concerned about her heart health due to this family history.  She denies smoking.  Plan: Obtain coronary CTA and echocardiogram.  January 2026: Patient consents to use of  AI scribe. In the interim the patient's insurance would not cover a coronary CTA.  She was referred for an exercise Lexiscan  stress test.  This showed low risk findings however inferior ischemia could not be ruled out nor could diaphragmatic attenuation.  She is here to discuss further.  She experienced an episode of chest pain last Wednesday while lying on the couch.  It lasted approximately ten minutes and  resolved spontaneously. She attempted to alleviate the pain by sitting up and breathing slowly and considered calling 911 but did not.  It happened about an hour after eating.  She does not remember what she ate.  It is not recurred.  She mentions occasional chest discomfort during physical activity, particularly when walking on steeper hills at the park with her husband. This discomfort resolves with rest and is not always present. The pain varies in location, sometimes affecting her upper chest and arm, and occurs infrequently, about once every couple of weeks.  She occasionally experiences acid reflux and takes Gaviscon as needed, though she does not recall the frequency of use. She is currently taking Celebrex .  Her family history is significant for heart problems, as her mother has multiple heart issues and leg blockages.  She continues to have occasional discomfort, including during physical activity. Occasional acid reflux without specific food triggers     Current Medications: Current Meds  Medication Sig   acetaminophen  (TYLENOL ) 500 MG tablet Take 1,000 mg by mouth every 6 (six) hours as needed for mild pain (pain score 1-3).   ARIPiprazole  (ABILIFY ) 15 MG tablet Take 1 tablet (15 mg total) by mouth daily.   aspirin-acetaminophen -caffeine (EXCEDRIN MIGRAINE) 250-250-65 MG tablet as needed for migraine or headache.   atorvastatin  (LIPITOR) 20 MG tablet Take 1 tablet (20 mg total) by mouth daily.   celecoxib  (CELEBREX ) 100 MG capsule Take 1 capsule (100 mg total) by mouth daily.   Cyanocobalamin  (VITAMIN B-12) 5000 MCG TBDP Take 500 mcg by mouth daily.   FLUoxetine  (PROZAC ) 20 MG capsule Take 1 capsule (20 mg total) by mouth daily. Take as prescribed   fluticasone  (FLONASE ) 50 MCG/ACT nasal spray Place 2 sprays into both nostrils daily.   isosorbide  mononitrate (IMDUR ) 30 MG 24 hr tablet Take 0.5 tablets (15 mg total) by mouth daily.   loratadine  (CLARITIN ) 10 MG tablet Take 1 tablet  (10 mg total) by mouth daily.   metoprolol  succinate (TOPROL  XL) 25 MG 24 hr tablet Take 0.5 tablets (12.5 mg total) by mouth daily.   nitroGLYCERIN  (NITROSTAT ) 0.4 MG SL tablet Place 1 tablet (0.4 mg total) under the tongue every 5 (five) minutes as needed for chest pain (Make sure you are sitting. May take every 5 minutes for chest pain. Do not take more the 3 doses in 15 minutes. If your chest pain is not better after 2 to 3 doses, call 911.).   pantoprazole  (PROTONIX ) 20 MG tablet Take 1 tablet (20 mg total) by mouth daily.   solifenacin  (VESICARE ) 10 MG tablet Take 1 tablet (10 mg total) by mouth daily.   traZODone  (DESYREL ) 50 MG tablet Take 1 tablet (50 mg total) by mouth at bedtime.     Review of Systems:   Please see the history of present illness.    All other systems reviewed and are negative.     EKGs/Labs/Other Test Reviewed:   EKG: EKG from October 21, 2023 demonstrates sinus rhythm with prolonged QTc of 540 ms  EKG Interpretation Date/Time:  Thursday July 19 2024  15:40:54 EST Ventricular Rate:  76 PR Interval:  118 QRS Duration:  86 QT Interval:  392 QTC Calculation: 441 R Axis:   6  Text Interpretation: Normal sinus rhythm Nonspecific ST abnormality When compared with ECG of 20-Apr-2024 15:47, No significant change was found Confirmed by [name] ([id number]) on 07/19/2024 3:42:58 PM        CARDIAC STUDIES: Refer to CV Procedures and Imaging Tabs   Risk Assessment/Calculations:          Physical Exam:   VS:  BP 123/86 (BP Location: Left Arm)   Pulse 79   Ht 5' 6 (1.676 m)   Wt 209 lb (94.8 kg)   SpO2 97%   BMI 33.73 kg/m        Wt Readings from Last 3 Encounters:  07/19/24 209 lb (94.8 kg)  04/30/24 192 lb 6.4 oz (87.3 kg)  04/20/24 190 lb 12.8 oz (86.5 kg)      GENERAL:  No apparent distress, AOx3 HEENT:  No carotid bruits, +2 carotid impulses, no scleral icterus CAR: RRR no murmurs, gallops, rubs, or thrills RES:  Clear to auscultation  bilaterally ABD:  Soft, nontender, nondistended, positive bowel sounds x 4 VASC:  +2 radial pulses, +2 carotid pulses NEURO:  CN 2-12 grossly intact; motor and sensory grossly intact PSYCH:  No active depression or anxiety EXT:  No edema, ecchymosis, or cyanosis  Signed, Edra Riccardi K Shiniqua Groseclose, MD  07/19/2024 3:43 PM    [facility] [address] [place], [place], [place]  [address] Phone: [phone]; Fax: [phone]   Note:  This document was prepared using Dragon voice recognition software and may include unintentional dictation errors. "

## 2024-07-17 ENCOUNTER — Ambulatory Visit (HOSPITAL_COMMUNITY): Payer: MEDICAID

## 2024-07-18 ENCOUNTER — Ambulatory Visit: Payer: MEDICAID | Admitting: Orthopedic Surgery

## 2024-07-18 ENCOUNTER — Encounter: Payer: Self-pay | Admitting: Orthopedic Surgery

## 2024-07-18 DIAGNOSIS — Z9889 Other specified postprocedural states: Secondary | ICD-10-CM

## 2024-07-18 NOTE — Progress Notes (Signed)
 "  Post-Op Visit Note   Patient: Maria Blackburn           Date of Birth: 1970-08-09           MRN: [id number] Visit Date: 07/18/2024 PCP: [name], NP   Assessment & Plan:  Chief Complaint:  Chief Complaint  Patient presents with   Right Shoulder - Follow-up    02/07/24 right shoulder scope with RCTR   Visit Diagnoses: No diagnosis found.  Plan: Maria Blackburn is now about 5 months out right shoulder arthroscopy with rotator cuff repair.  Overall she is doing well.  She discontinued therapy due to cost.  She is able to sleep on the right-hand side.  On exam she has range of motion of 30/90/140.  External rotation strength is 5 out of 5.  Overall Maria Blackburn's made good progress.  No grinding or crepitus with passive range of motion.  I think her motion will continue to improve the next 4 to 5 months.  She will follow-up with us  as needed.  She is on an excellent job of rehabilitating a slightly stiffer than normal shoulder.  Follow-Up Instructions: No follow-ups on file.   Orders:  No orders of the defined types were placed in this encounter.  No orders of the defined types were placed in this encounter.   Imaging: No results found.  PMFS History: Patient Active Problem List   Diagnosis Date Noted   Degenerative superior labral anterior-to-posterior (SLAP) tear of right shoulder 02/26/2024   Synovitis of right shoulder 02/26/2024   Biceps tendonitis, right 02/26/2024   Major depressive disorder, recurrent episode, moderate (HCC) 02/21/2024   PTSD (post-traumatic stress disorder) 10/22/2023   MDD (major depressive disorder), recurrent, severe, with psychosis (HCC) 10/21/2023   Acute psychosis (HCC) 09/29/2023   GAD (generalized anxiety disorder) 09/29/2023   Adhesive capsulitis of right shoulder 09/16/2023   Nontraumatic complete tear of right rotator cuff 09/16/2023   De Quervain's tenosynovitis, left 09/10/2021   Lateral epicondylitis, right elbow 09/10/2021   Allergy to  environmental factors 10/07/2017   Past Medical History:  Diagnosis Date   Anxiety    Bladder spasms    Frequency of urination    GERD (gastroesophageal reflux disease)    Interstitial cystitis    Pelvic pain    SUI (stress urinary incontinence, female)    Urgency of urination     Family History  Problem Relation Age of Onset   Heart disease Mother    Diabetes Son     Past Surgical History:  Procedure Laterality Date   ABDOMINAL HYSTERECTOMY  2000   ovaries remain   BICEPT TENODESIS Right 02/07/2024   Procedure: TENODESIS, BICEPS, RIGHT;  Surgeon: Addie Cordella Hamilton, MD;  Location: [facility] OR;  Service: Orthopedics;  Laterality: Right;   CYSTO WITH HYDRODISTENSION N/A 03/24/2017   Procedure: CYSTOSCOPY/HYDRODISTENSION INSTILLATION OF MARCAINE  AND PYRIDIUM ;  Surgeon: Watt Rush, MD;  Location: [facility];  Service: Urology;  Laterality: N/A;   HYSTEROSCOPY WITH D & C  1999   POLYPECTOMY   SHOULDER OPEN ROTATOR CUFF REPAIR Right 02/07/2024   Procedure: REPAIR, ROTATOR CUFF, OPEN, RIGHT;  Surgeon: Addie Cordella Hamilton, MD;  Location: [facility] OR;  Service: Orthopedics;  Laterality: Right;   TUBAL LIGATION Bilateral 1996   Social History   Occupational History   Not on file  Tobacco Use   Smoking status: Never   Smokeless tobacco: Never  Vaping Use   Vaping status: Never Used  Substance and Sexual  Activity   Alcohol use: No   Drug use: No   Sexual activity: Yes     "

## 2024-07-19 ENCOUNTER — Encounter: Payer: Self-pay | Admitting: Internal Medicine

## 2024-07-19 ENCOUNTER — Ambulatory Visit: Payer: MEDICAID | Attending: Internal Medicine | Admitting: Internal Medicine

## 2024-07-19 VITALS — BP 123/86 | HR 79 | Ht 66.0 in | Wt 209.0 lb

## 2024-07-19 DIAGNOSIS — I209 Angina pectoris, unspecified: Secondary | ICD-10-CM | POA: Diagnosis not present

## 2024-07-19 DIAGNOSIS — R9431 Abnormal electrocardiogram [ECG] [EKG]: Secondary | ICD-10-CM

## 2024-07-19 DIAGNOSIS — R9439 Abnormal result of other cardiovascular function study: Secondary | ICD-10-CM

## 2024-07-19 MED ORDER — ISOSORBIDE MONONITRATE ER 30 MG PO TB24: 15.0000 mg | ORAL_TABLET | Freq: Every day | ORAL | 3 refills | Status: AC

## 2024-07-19 MED ORDER — PANTOPRAZOLE SODIUM 20 MG PO TBEC: 20.0000 mg | DELAYED_RELEASE_TABLET | Freq: Every day | ORAL | 3 refills | Status: AC

## 2024-07-19 MED ORDER — NITROGLYCERIN 0.4 MG SL SUBL: 0.4000 mg | SUBLINGUAL_TABLET | SUBLINGUAL | 3 refills | Status: AC | PRN

## 2024-07-19 MED ORDER — METOPROLOL SUCCINATE ER 25 MG PO TB24: 12.5000 mg | ORAL_TABLET | Freq: Every day | ORAL | 3 refills | Status: AC

## 2024-07-19 NOTE — Patient Instructions (Addendum)
 Medication Instructions:  Isosorbide  Mononitrate (Imdur ) 15 mg every night Metoprolol  Succinate (Toprol  XL) 12.5 mg every night Pantoprazole  20 mg daily  Nitroglycerin  0.4 mg sublingual (under tongue) every 5 minutes. May take up to 3 doses in 15 min  NITROGLYCERIN   is a type of vasodilator. It relaxes blood vessels, increasing the blood and oxygen supply to your heart. This medicine is used to relieve chest pain caused by angina.  In an angina attack (CHEST PAIN), you should feel better within 5 minutes after your first dose. Do not swallow whole. Place tablet under your tongue. Sit down when taking this medicine. You can take a dose every 5 minutes up to a total of 3 doses. If you do not feel better or feel worse after 1 dose, call 9-1-1 at once. Do not take more than 3 doses in 15 minutes. Do not take your medicine more often than directed.  *If you need a refill on your cardiac medications before your next appointment, please call your pharmacy*  If you experience active Chest Pain, including tightness, pressure, jaw pain, radiating pain to shoulder/upper arm/back, Chest Pain unrelieved by 2-3 doses of Nitroglycerin , with or without Shortness Of Breath, nausea, vomiting, and/or sweating- THEN CALL 911 AND GO TO THE NEAREST EMERGENCY ROOM   Lab Work: None ordered If you have labs (blood work) drawn today and your tests are completely normal, you will receive your results only by: MyChart Message (if you have MyChart) OR A paper copy in the mail If you have any lab test that is abnormal or we need to change your treatment, we will call you to review the results.  Testing/Procedures: None ordered  Follow-Up: At [facility], you and your health needs are our priority.  As part of our continuing mission to provide you with exceptional heart care, our providers are all part of one team.  This team includes your primary Cardiologist (physician) and Advanced Practice Providers or APPs  (Physician Assistants and Nurse Practitioners) who all work together to provide you with the care you need, when you need it.  Your next appointment:   1 month(s)  Provider:   Dr Wendel  We recommend signing up for the patient portal called MyChart.  Sign up information is provided on this After Visit Summary.  MyChart is used to connect with patients for Virtual Visits (Telemedicine).  Patients are able to view lab/test results, encounter notes, upcoming appointments, etc.  Non-urgent messages can be sent to your provider as well.   To learn more about what you can do with MyChart, go to [email or web address].

## 2024-08-07 ENCOUNTER — Other Ambulatory Visit: Payer: Self-pay | Admitting: Family

## 2024-08-07 DIAGNOSIS — Z1231 Encounter for screening mammogram for malignant neoplasm of breast: Secondary | ICD-10-CM

## 2024-08-09 NOTE — Progress Notes (Unsigned)
 "  Cardiology Office Note:   Date:  08/09/2024  ID:  Maria Blackburn, DOB June 18, 1971, MRN [id number] PCP:  Maria Greig PARAS, NP  [facility] Providers Cardiologist:  [name], MD Referring MD: Maria Greig PARAS, NP  Chief Complaint/Reason for Referral: QT prolongation ASSESSMENT:    1. Angina pectoris   2. Prolonged QT interval       PLAN:   In order of problems listed above: Angina pectoris: Cath?  Start aspirin 81 mg, continue Toprol  12.5 mg, prn nitroglycerin , Imdur  15 mg.*** Prolonged QT interval.  Last 3 EKGs demonstrated a normal QT interval.  The patient is on multiple medications that can prolong QT.  These include fluoxetine  that is associated with moderate risk for QT prolongation.  Sertraline  which has a low to moderate risk, and Abilify  which has a low risk.  Solifenacin  also has been associated with QT prolongation.  Her other medications are not associated with QT prolongation.              Dispo:  No follow-ups on file.       I spent 42 minutes reviewing all clinical data during and prior to this visit including all relevant imaging studies, laboratories, clinical information from other health systems and prior notes from both Cardiology and other specialties, interviewing the patient, conducting a complete physical examination, and coordinating care in order to formulate a comprehensive and personalized evaluation and treatment plan.   History of Present Illness:    FOCUSED PROBLEM LIST:   Chest pain syndrome Low risk exercise Lexiscan  with diaphragmatic attenuation versus mild inferior wall ischemia; EF 65% Generalized anxiety disorder Major depressive disorder with psychosis PTSD  October 2025:  Patient consents to use of AI scribe. The patient is 54 year old female with above listed medical problems referred for recommendations regarding QT prolongation seen on her EKG.  Patient had an EKG done on October 21, 2023 which demonstrated prolonged QT with a QTc of  545 ms.  She was referred to cardiology for this issue.  An EKG done on October 23, 2023 demonstrated normalization of QT interval with a QTc of 444 ms.  Approximately two and a half weeks ago, she experienced an episode of chest discomfort that began after swallowing her medication, with pain radiating from her chest down her right arm. This discomfort is atypical for her, as it usually affects her left arm. The episode lasted about ten minutes and was described as 'really discomfort' that sometimes causes arm numbness. These episodes occur infrequently, not even weekly, and sometimes happen without any apparent trigger.  She denies experiencing chest discomfort while walking. No swelling in her legs is noted. She reports a weight gain of fifteen to twenty pounds, which she feels has not been helpful.  Her family history is significant for heart problems, as her mother has had numerous heart issues. She is concerned about her heart health due to this family history.  She denies smoking.  Plan: Obtain coronary CTA and echocardiogram.  January 2026: Patient consents to use of AI scribe. In the interim the patient's insurance would not cover a coronary CTA.  She was referred for an exercise Lexiscan  stress test.  This showed low risk findings however inferior ischemia could not be ruled out due to diaphragmatic attenuation.  She is here to discuss further.  She experienced an episode of chest pain last Wednesday while lying on the couch.  It lasted approximately ten minutes and resolved spontaneously. She attempted to alleviate the  pain by sitting up and breathing slowly and considered calling 911 but did not.  It happened about an hour after eating.  She does not remember what she ate.  It is not recurred.  She mentions occasional chest discomfort during physical activity, particularly when walking on steeper hills at the park with her husband. This discomfort resolves with rest and is not always  present. The pain varies in location, sometimes affecting her upper chest and arm, and occurs infrequently, about once every couple of weeks.  She occasionally experiences acid reflux and takes Gaviscon as needed, though she does not recall the frequency of use. She is currently taking Celebrex .  Her family history is significant for heart problems, as her mother has multiple heart issues and leg blockages.  She continues to have occasional discomfort, including during physical activity. Occasional acid reflux without specific food triggers.  Plan start Imdur  15 mg, Toprol  12.5 mg, as needed nitroglycerin , and PPI.  Return in 1 month.  February 2026:  Patient consents to use of AI scribe. The patient returns for 1 month follow-up.     Current Medications: No outpatient medications have been marked as taking for the 08/20/24 encounter (Appointment) with [name], MD.     Review of Systems:   Please see the history of present illness.    All other systems reviewed and are negative.     EKGs/Labs/Other Test Reviewed:   EKG: EKG from October 21, 2023 demonstrates sinus rhythm with prolonged QTc of 540 ms  EKG Interpretation Date/Time:    Ventricular Rate:    PR Interval:    QRS Duration:    QT Interval:    QTC Calculation:   R Axis:      Text Interpretation:          CARDIAC STUDIES: Refer to CV Procedures and Imaging Tabs   Risk Assessment/Calculations:          Physical Exam:   VS:  There were no vitals taken for this visit.   No BP recorded.  {Refresh Note OR Click here to enter BP  :1}***   Wt Readings from Last 3 Encounters:  07/19/24 209 lb (94.8 kg)  04/30/24 192 lb 6.4 oz (87.3 kg)  04/20/24 190 lb 12.8 oz (86.5 kg)      GENERAL:  No apparent distress, AOx3 HEENT:  No carotid bruits, +2 carotid impulses, no scleral icterus CAR: RRR no murmurs, gallops, rubs, or thrills RES:  Clear to auscultation bilaterally ABD:  Soft, nontender, nondistended,  positive bowel sounds x 4 VASC:  +2 radial pulses, +2 carotid pulses NEURO:  CN 2-12 grossly intact; motor and sensory grossly intact PSYCH:  No active depression or anxiety EXT:  No edema, ecchymosis, or cyanosis  Signed, Clarene Curran K Beryl Hornberger, MD  08/09/2024 4:58 PM    [facility] [address] [place], [place], [place]  [address] Phone: [phone]; Fax: [phone]   Note:  This document was prepared using Dragon voice recognition software and may include unintentional dictation errors. "

## 2024-08-16 ENCOUNTER — Ambulatory Visit: Payer: MEDICAID

## 2024-08-20 ENCOUNTER — Ambulatory Visit: Payer: MEDICAID | Admitting: Internal Medicine

## 2024-08-20 DIAGNOSIS — R9431 Abnormal electrocardiogram [ECG] [EKG]: Secondary | ICD-10-CM

## 2024-08-20 DIAGNOSIS — I209 Angina pectoris, unspecified: Secondary | ICD-10-CM

## 2024-09-06 ENCOUNTER — Telehealth (HOSPITAL_COMMUNITY): Payer: MEDICAID

## 2025-01-17 ENCOUNTER — Ambulatory Visit: Payer: Self-pay | Admitting: Urology
